# Patient Record
Sex: Female | Born: 1970 | Race: White | Hispanic: No | Marital: Married | Smoking: Current every day smoker
Health system: Southern US, Community
[De-identification: ages and names within clinical notes are randomized; demographics above are authoritative.]

## PROBLEM LIST (undated history)

## (undated) DIAGNOSIS — R070 Pain in throat: Secondary | ICD-10-CM

## (undated) DIAGNOSIS — F329 Major depressive disorder, single episode, unspecified: Secondary | ICD-10-CM

## (undated) DIAGNOSIS — M1990 Unspecified osteoarthritis, unspecified site: Secondary | ICD-10-CM

## (undated) DIAGNOSIS — D680 Von Willebrand's disease: Secondary | ICD-10-CM

## (undated) DIAGNOSIS — K219 Gastro-esophageal reflux disease without esophagitis: Secondary | ICD-10-CM

## (undated) DIAGNOSIS — G43909 Migraine, unspecified, not intractable, without status migrainosus: Secondary | ICD-10-CM

## (undated) DIAGNOSIS — D509 Iron deficiency anemia, unspecified: Secondary | ICD-10-CM

## (undated) DIAGNOSIS — K120 Recurrent oral aphthae: Secondary | ICD-10-CM

## (undated) DIAGNOSIS — F419 Anxiety disorder, unspecified: Secondary | ICD-10-CM

## (undated) DIAGNOSIS — F32A Depression, unspecified: Secondary | ICD-10-CM

## (undated) HISTORY — DX: Unspecified osteoarthritis, unspecified site: M19.90

## (undated) HISTORY — PX: OTHER SURGICAL HISTORY: SHX169

## (undated) HISTORY — DX: Pain in throat: R07.0

## (undated) HISTORY — DX: Major depressive disorder, single episode, unspecified: F32.9

## (undated) HISTORY — DX: Depression, unspecified: F32.A

## (undated) HISTORY — PX: COLONOSCOPY: SHX174

## (undated) HISTORY — PX: ESOPHAGOGASTRODUODENOSCOPY: SHX1529

## (undated) HISTORY — DX: Anxiety disorder, unspecified: F41.9

## (undated) HISTORY — DX: Iron deficiency anemia, unspecified: D50.9

## (undated) HISTORY — DX: Migraine, unspecified, not intractable, without status migrainosus: G43.909

## (undated) HISTORY — DX: Gastro-esophageal reflux disease without esophagitis: K21.9

## (undated) HISTORY — DX: Recurrent oral aphthae: K12.0

---
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
## 1997-10-31 ENCOUNTER — Other Ambulatory Visit: Admission: RE | Admit: 1997-10-31 | Discharge: 1997-10-31 | Payer: Self-pay | Admitting: Obstetrics and Gynecology

## 1997-11-20 ENCOUNTER — Other Ambulatory Visit: Admission: RE | Admit: 1997-11-20 | Discharge: 1997-11-20 | Payer: Self-pay | Admitting: Obstetrics and Gynecology

## 1998-03-12 ENCOUNTER — Other Ambulatory Visit: Admission: RE | Admit: 1998-03-12 | Discharge: 1998-03-12 | Payer: Self-pay | Admitting: Obstetrics and Gynecology

## 1998-03-13 ENCOUNTER — Ambulatory Visit (HOSPITAL_COMMUNITY): Admission: RE | Admit: 1998-03-13 | Discharge: 1998-03-13 | Payer: Self-pay | Admitting: Obstetrics and Gynecology

## 1998-07-14 ENCOUNTER — Emergency Department (HOSPITAL_COMMUNITY): Admission: EM | Admit: 1998-07-14 | Discharge: 1998-07-14 | Payer: Self-pay | Admitting: Emergency Medicine

## 1998-07-14 ENCOUNTER — Encounter: Payer: Self-pay | Admitting: Emergency Medicine

## 1998-08-12 ENCOUNTER — Other Ambulatory Visit: Admission: RE | Admit: 1998-08-12 | Discharge: 1998-08-12 | Payer: Self-pay | Admitting: Obstetrics and Gynecology

## 1999-02-01 ENCOUNTER — Emergency Department (HOSPITAL_COMMUNITY): Admission: EM | Admit: 1999-02-01 | Discharge: 1999-02-01 | Payer: Self-pay | Admitting: Emergency Medicine

## 1999-02-01 ENCOUNTER — Encounter: Payer: Self-pay | Admitting: Emergency Medicine

## 1999-04-17 ENCOUNTER — Other Ambulatory Visit: Admission: RE | Admit: 1999-04-17 | Discharge: 1999-04-17 | Payer: Self-pay | Admitting: Obstetrics and Gynecology

## 2000-06-17 ENCOUNTER — Other Ambulatory Visit: Admission: RE | Admit: 2000-06-17 | Discharge: 2000-06-17 | Payer: Self-pay | Admitting: Internal Medicine

## 2001-09-11 ENCOUNTER — Emergency Department (HOSPITAL_COMMUNITY): Admission: EM | Admit: 2001-09-11 | Discharge: 2001-09-11 | Payer: Self-pay | Admitting: Emergency Medicine

## 2001-09-11 ENCOUNTER — Encounter: Payer: Self-pay | Admitting: Emergency Medicine

## 2002-12-12 ENCOUNTER — Other Ambulatory Visit: Admission: RE | Admit: 2002-12-12 | Discharge: 2002-12-12 | Payer: Self-pay | Admitting: Obstetrics and Gynecology

## 2004-03-11 ENCOUNTER — Other Ambulatory Visit: Admission: RE | Admit: 2004-03-11 | Discharge: 2004-03-11 | Payer: Self-pay | Admitting: Obstetrics and Gynecology

## 2005-01-07 ENCOUNTER — Ambulatory Visit (HOSPITAL_COMMUNITY): Admission: RE | Admit: 2005-01-07 | Discharge: 2005-01-07 | Payer: Self-pay | Admitting: General Practice

## 2005-01-07 ENCOUNTER — Ambulatory Visit: Payer: Self-pay | Admitting: Internal Medicine

## 2005-02-02 ENCOUNTER — Ambulatory Visit: Payer: Self-pay | Admitting: Internal Medicine

## 2005-06-12 ENCOUNTER — Other Ambulatory Visit: Admission: RE | Admit: 2005-06-12 | Discharge: 2005-06-12 | Payer: Self-pay | Admitting: Obstetrics and Gynecology

## 2006-03-26 ENCOUNTER — Ambulatory Visit: Payer: Self-pay | Admitting: Endocrinology

## 2006-07-08 ENCOUNTER — Ambulatory Visit: Payer: Self-pay | Admitting: Internal Medicine

## 2007-02-28 ENCOUNTER — Ambulatory Visit: Payer: Self-pay | Admitting: Family Medicine

## 2007-05-30 ENCOUNTER — Encounter: Payer: Self-pay | Admitting: Internal Medicine

## 2007-05-30 DIAGNOSIS — D509 Iron deficiency anemia, unspecified: Secondary | ICD-10-CM

## 2007-05-30 DIAGNOSIS — F3341 Major depressive disorder, recurrent, in partial remission: Secondary | ICD-10-CM

## 2007-05-30 HISTORY — DX: Iron deficiency anemia, unspecified: D50.9

## 2007-12-05 ENCOUNTER — Ambulatory Visit: Payer: Self-pay | Admitting: Internal Medicine

## 2007-12-05 DIAGNOSIS — M542 Cervicalgia: Secondary | ICD-10-CM

## 2007-12-05 DIAGNOSIS — J019 Acute sinusitis, unspecified: Secondary | ICD-10-CM | POA: Insufficient documentation

## 2008-06-15 ENCOUNTER — Ambulatory Visit: Payer: Self-pay | Admitting: Internal Medicine

## 2008-06-15 DIAGNOSIS — F430 Acute stress reaction: Secondary | ICD-10-CM

## 2008-06-15 DIAGNOSIS — F411 Generalized anxiety disorder: Secondary | ICD-10-CM

## 2008-06-15 DIAGNOSIS — F419 Anxiety disorder, unspecified: Secondary | ICD-10-CM | POA: Insufficient documentation

## 2008-06-15 DIAGNOSIS — J069 Acute upper respiratory infection, unspecified: Secondary | ICD-10-CM

## 2008-06-21 ENCOUNTER — Ambulatory Visit: Payer: Self-pay | Admitting: Professional

## 2008-07-26 ENCOUNTER — Ambulatory Visit: Payer: Self-pay | Admitting: Internal Medicine

## 2008-07-26 DIAGNOSIS — R070 Pain in throat: Secondary | ICD-10-CM

## 2008-07-26 DIAGNOSIS — M545 Low back pain: Secondary | ICD-10-CM

## 2008-07-26 HISTORY — DX: Pain in throat: R07.0

## 2009-01-29 ENCOUNTER — Ambulatory Visit: Payer: Self-pay | Admitting: Family Medicine

## 2009-02-28 ENCOUNTER — Emergency Department (HOSPITAL_COMMUNITY): Admission: EM | Admit: 2009-02-28 | Discharge: 2009-02-28 | Payer: Self-pay | Admitting: Emergency Medicine

## 2009-04-02 ENCOUNTER — Encounter: Payer: Self-pay | Admitting: Internal Medicine

## 2009-05-15 ENCOUNTER — Ambulatory Visit: Payer: Self-pay | Admitting: Family Medicine

## 2009-06-28 ENCOUNTER — Ambulatory Visit: Payer: Self-pay | Admitting: Family Medicine

## 2009-08-29 ENCOUNTER — Ambulatory Visit: Payer: Self-pay | Admitting: Family Medicine

## 2009-09-11 ENCOUNTER — Encounter: Payer: Self-pay | Admitting: Internal Medicine

## 2009-10-08 ENCOUNTER — Emergency Department (HOSPITAL_COMMUNITY): Admission: EM | Admit: 2009-10-08 | Discharge: 2009-10-08 | Payer: Self-pay | Admitting: Emergency Medicine

## 2011-12-11 ENCOUNTER — Ambulatory Visit (INDEPENDENT_AMBULATORY_CARE_PROVIDER_SITE_OTHER): Payer: 59 | Admitting: Internal Medicine

## 2011-12-11 VITALS — BP 92/60 | HR 77 | Temp 99.4°F | Ht 61.0 in | Wt 132.0 lb

## 2011-12-11 DIAGNOSIS — M791 Myalgia, unspecified site: Secondary | ICD-10-CM

## 2011-12-11 DIAGNOSIS — IMO0001 Reserved for inherently not codable concepts without codable children: Secondary | ICD-10-CM

## 2011-12-11 DIAGNOSIS — R070 Pain in throat: Secondary | ICD-10-CM

## 2011-12-11 DIAGNOSIS — H1032 Unspecified acute conjunctivitis, left eye: Secondary | ICD-10-CM

## 2011-12-11 DIAGNOSIS — H1030 Unspecified acute conjunctivitis, unspecified eye: Secondary | ICD-10-CM

## 2011-12-11 DIAGNOSIS — J019 Acute sinusitis, unspecified: Secondary | ICD-10-CM

## 2011-12-11 MED ORDER — TOBRAMYCIN 0.3 % OP SOLN: 1.0000 [drp] | Freq: Four times a day (QID) | OPHTHALMIC | Status: AC

## 2011-12-11 MED ORDER — CEFUROXIME AXETIL 250 MG PO TABS: 250.0000 mg | ORAL_TABLET | Freq: Two times a day (BID) | ORAL | Status: AC

## 2011-12-11 MED ORDER — IBUPROFEN 600 MG PO TABS: 600.0000 mg | ORAL_TABLET | Freq: Three times a day (TID) | ORAL | Status: AC | PRN

## 2011-12-11 NOTE — Patient Instructions (Signed)
Take all new medications as prescribed Continue all other medications as before You will be contacted regarding the referral for: Dr Lazarus Salines

## 2011-12-12 ENCOUNTER — Encounter: Payer: Self-pay | Admitting: Internal Medicine

## 2011-12-12 DIAGNOSIS — F329 Major depressive disorder, single episode, unspecified: Secondary | ICD-10-CM | POA: Insufficient documentation

## 2011-12-12 DIAGNOSIS — F419 Anxiety disorder, unspecified: Secondary | ICD-10-CM | POA: Insufficient documentation

## 2011-12-12 DIAGNOSIS — A6000 Herpesviral infection of urogenital system, unspecified: Secondary | ICD-10-CM | POA: Insufficient documentation

## 2011-12-12 DIAGNOSIS — K219 Gastro-esophageal reflux disease without esophagitis: Secondary | ICD-10-CM | POA: Insufficient documentation

## 2011-12-12 DIAGNOSIS — K120 Recurrent oral aphthae: Secondary | ICD-10-CM | POA: Insufficient documentation

## 2011-12-12 NOTE — Assessment & Plan Note (Signed)
Mild to mod, for antibx course,  to f/u any worsening symptoms or concerns 

## 2011-12-12 NOTE — Progress Notes (Signed)
  Subjective:    Patient ID: Carmen Hoffman, female    DOB: 1971-02-11, 41 y.o.   MRN: [id number]  HPI  Here with 3 days acute onset fever, facial pain, pressure, general weakness and malaise, and greenish d/c, with slight ST, but little to no cough and Pt denies chest pain, increased sob or doe, wheezing, orthopnea, PND, increased LE swelling, palpitations, dizziness or syncope, also with left eye itching and matting as well with slight d/c.  Has diffuse aches adn advil alone not working well.  Also has seen Dr Shelba Flake in the past, also with concern about a feeling of a ? Mass just in back of the tongue, but no dysphagia, n/v, cough .  Pt denies new neurological symptoms such as new headache, or facial or extremity weakness or numbness   Pt denies polydipsia, polyuria. Past Medical History  Diagnosis Date  . GERD (gastroesophageal reflux disease)   . Genital herpes   . Aphthous stomatitis   . Anxiety   . Depression    History reviewed. No pertinent past surgical history.  reports that she has been smoking.  She does not have any smokeless tobacco history on file. She reports that she does not drink alcohol or use illicit drugs. family history includes Fibromyalgia in her mother and Hypertension in her father. No Known Allergies No current outpatient prescriptions on file prior to visit.   Review of Systems Review of Systems  Constitutional: Negative for diaphoresis and unexpected weight change.  HENT: Negative for drooling and tinnitus.   Eyes: Negative for photophobia and visual disturbance.  Respiratory: Negative for choking and stridor.   Gastrointestinal: Negative for vomiting and blood in stool.  Genitourinary: Negative for hematuria and decreased urine volume.    Objective:   Physical Exam BP 92/60  Pulse 77  Temp(Src) 99.4 F (37.4 C) (Oral)  Ht 5\' 1"  (1.549 m)  Wt 132 lb (59.875 kg)  BMI 24.94 kg/m2  SpO2 97% Physical Exam  VS noted, mild ill Constitutional: Pt  appears well-developed and well-nourished.  HENT: Head: Normocephalic.  Right Ear: External ear normal.  Left Ear: External ear normal.  left conjucnt with 1+puffy, erythema, watery d/c Mouth; no sweling, erythema, mass, tongue swelling or ulcers Bilat tm's mild erythema.  Sinus tender.  Pharynx mild erythema Eyes: Conjunctivae and EOM are normal. Pupils are equal, round, and reactive to light.  Neck: Normal range of motion. Neck supple.  Cardiovascular: Normal rate and regular rhythm.   Pulmonary/Chest: Effort normal and breath sounds normal.  Neurological: Pt is alert. No cranial nerve deficit.  Skin: Skin is warm. No erythema.  Psychiatric: Pt behavior is normal. Thought content normal. tense, nervous 1+    Assessment & Plan:

## 2011-12-12 NOTE — Assessment & Plan Note (Signed)
?   Recurrent stomatitis vs other - for ENT referral for re-eval

## 2011-12-12 NOTE — Assessment & Plan Note (Signed)
,  Mild to mod, for antibx course,  to f/u any worsening symptoms or concerns 

## 2011-12-12 NOTE — Assessment & Plan Note (Signed)
Mild to mod, for nsaid prn,  to f/u any worsening symptoms or concerns 

## 2012-01-13 ENCOUNTER — Other Ambulatory Visit: Payer: Self-pay | Admitting: Otolaryngology

## 2012-01-13 DIAGNOSIS — K219 Gastro-esophageal reflux disease without esophagitis: Secondary | ICD-10-CM

## 2012-01-15 ENCOUNTER — Ambulatory Visit
Admission: RE | Admit: 2012-01-15 | Discharge: 2012-01-15 | Disposition: A | Discharge status: Home / Self Care | Payer: 59 | Source: Ambulatory Visit | Attending: Otolaryngology | Admitting: Otolaryngology

## 2012-01-15 DIAGNOSIS — K219 Gastro-esophageal reflux disease without esophagitis: Secondary | ICD-10-CM

## 2012-07-05 ENCOUNTER — Telehealth: Payer: Self-pay | Admitting: Internal Medicine

## 2012-07-05 NOTE — Telephone Encounter (Signed)
Ok w/me if she would like to re-establish Thx

## 2012-07-05 NOTE — Telephone Encounter (Signed)
Pt saw Dr Jonny Ruiz for an acute in March 2013.  LOV with Dr. Posey Rea was in 2009.  Do you want [place] to re-est her with you?  She has EMR insurance.  She want to be worked in for back pain.

## 2012-07-12 NOTE — Telephone Encounter (Signed)
APPT ON OCT 7.

## 2012-07-18 ENCOUNTER — Ambulatory Visit (INDEPENDENT_AMBULATORY_CARE_PROVIDER_SITE_OTHER): Payer: 59 | Admitting: Internal Medicine

## 2012-07-18 ENCOUNTER — Other Ambulatory Visit (INDEPENDENT_AMBULATORY_CARE_PROVIDER_SITE_OTHER): Payer: 59

## 2012-07-18 ENCOUNTER — Encounter: Payer: Self-pay | Admitting: Internal Medicine

## 2012-07-18 ENCOUNTER — Telehealth: Payer: Self-pay | Admitting: Internal Medicine

## 2012-07-18 VITALS — BP 100/50 | HR 68 | Temp 98.4°F | Resp 16 | Ht 61.0 in | Wt 133.8 lb

## 2012-07-18 DIAGNOSIS — R3915 Urgency of urination: Secondary | ICD-10-CM | POA: Insufficient documentation

## 2012-07-18 DIAGNOSIS — R209 Unspecified disturbances of skin sensation: Secondary | ICD-10-CM

## 2012-07-18 DIAGNOSIS — M545 Low back pain: Secondary | ICD-10-CM

## 2012-07-18 DIAGNOSIS — K219 Gastro-esophageal reflux disease without esophagitis: Secondary | ICD-10-CM

## 2012-07-18 DIAGNOSIS — R202 Paresthesia of skin: Secondary | ICD-10-CM

## 2012-07-18 DIAGNOSIS — Z23 Encounter for immunization: Secondary | ICD-10-CM

## 2012-07-18 LAB — CBC WITH DIFFERENTIAL/PLATELET
Basophils Absolute: 0 10*3/uL (ref 0.0–0.1)
Eosinophils Absolute: 0.1 10*3/uL (ref 0.0–0.7)
HCT: 38.8 % (ref 36.0–46.0)
Lymphs Abs: 2.2 10*3/uL (ref 0.7–4.0)
MCHC: 33.1 g/dL (ref 30.0–36.0)
MCV: 90.6 fl (ref 78.0–100.0)
Monocytes Absolute: 0.5 10*3/uL (ref 0.1–1.0)
Platelets: 165 10*3/uL (ref 150.0–400.0)
RDW: 12.8 % (ref 11.5–14.6)

## 2012-07-18 LAB — BASIC METABOLIC PANEL
Calcium: 9.5 mg/dL (ref 8.4–10.5)
GFR: 115.15 mL/min (ref >60.00)
Glucose, Bld: 90 mg/dL (ref 70–99)
Sodium: 137 mEq/L (ref 135–145)

## 2012-07-18 LAB — URINALYSIS
Ketones, ur: NEGATIVE
Specific Gravity, Urine: 1.02 (ref 1.000–1.030)
Total Protein, Urine: NEGATIVE
Urine Glucose: NEGATIVE

## 2012-07-18 LAB — HEPATIC FUNCTION PANEL
ALT: 18 U/L (ref 0–35)
Bilirubin, Direct: 0.1 mg/dL (ref 0.0–0.3)
Total Bilirubin: 0.2 mg/dL — ABNORMAL LOW (ref 0.3–1.2)

## 2012-07-18 LAB — LIPID PANEL
Cholesterol: 163 mg/dL (ref 0–200)
HDL: 41.4 mg/dL (ref >39.00)
LDL Cholesterol: 99 mg/dL (ref 0–99)
Total CHOL/HDL Ratio: 4
Triglycerides: 112 mg/dL (ref 0.0–149.0)

## 2012-07-18 LAB — TSH: TSH: 3.14 u[IU]/mL (ref 0.35–5.50)

## 2012-07-18 MED ORDER — IBUPROFEN 600 MG PO TABS: 600.0000 mg | ORAL_TABLET | Freq: Three times a day (TID) | ORAL | Status: DC | PRN

## 2012-07-18 MED ORDER — MIRABEGRON ER 25 MG PO TB24: 25.0000 mg | ORAL_TABLET | Freq: Every day | ORAL | Status: DC

## 2012-07-18 MED ORDER — CYANOCOBALAMIN 1000 MCG/ML IJ SOLN: INTRAMUSCULAR | Status: DC

## 2012-07-18 MED ORDER — VITAMIN D 1000 UNITS PO TABS: 1000.0000 [IU] | ORAL_TABLET | Freq: Every day | ORAL | Status: DC

## 2012-07-18 NOTE — Telephone Encounter (Signed)
Carmen Hoffman, please, inform patient that all labs are normal except for low vit B12. Come for the first shot here or start sq shots at home -- 1000 mcg sq qd x 7 d, then q 1 wk x 4 wks, then monthly. Star vit D  Keep rov. B12 level prior  Thx

## 2012-07-18 NOTE — Progress Notes (Signed)
  Subjective:    Patient ID: Carmen Hoffman, female    DOB: Oct 01, 1971, 41 y.o.   MRN: [id number]  HPI  C/o LBP and LLE pain x years C/o frequent urination, urgency C/o fatigue C/o GERD  Review of Systems  Constitutional: Positive for fatigue. Negative for chills, activity change, appetite change and unexpected weight change.  HENT: Negative for congestion, mouth sores and sinus pressure.   Eyes: Negative for visual disturbance.  Respiratory: Negative for cough and chest tightness.   Gastrointestinal: Negative for nausea and abdominal pain.  Genitourinary: Positive for frequency. Negative for difficulty urinating and vaginal pain.  Musculoskeletal: Negative for back pain and gait problem.  Skin: Negative for pallor and rash.  Neurological: Negative for dizziness, tremors, weakness, numbness and headaches.  Psychiatric/Behavioral: Negative for confusion and disturbed wake/sleep cycle.       Objective:   Physical Exam  Constitutional: She appears well-developed. No distress.  HENT:  Head: Normocephalic.  Right Ear: External ear normal.  Left Ear: External ear normal.  Nose: Nose normal.  Mouth/Throat: Oropharynx is clear and moist.  Eyes: Conjunctivae normal are normal. Pupils are equal, round, and reactive to light. Right eye exhibits no discharge. Left eye exhibits no discharge.  Neck: Normal range of motion. Neck supple. No JVD present. No tracheal deviation present. No thyromegaly present.  Cardiovascular: Normal rate, regular rhythm and normal heart sounds.   Pulmonary/Chest: No stridor. No respiratory distress. She has no wheezes.  Abdominal: Soft. Bowel sounds are normal. She exhibits no distension and no mass. There is no tenderness. There is no rebound and no guarding.  Musculoskeletal: She exhibits no edema and no tenderness.  Lymphadenopathy:    She has no cervical adenopathy.  Neurological: She displays normal reflexes. No cranial nerve deficit. She exhibits normal  muscle tone. Coordination normal.  Skin: No rash noted. No erythema.  Psychiatric: She has a normal mood and affect. Her behavior is normal. Judgment and thought content normal.          Assessment & Plan:

## 2012-07-18 NOTE — Assessment & Plan Note (Signed)
Chronic MSK 

## 2012-07-19 ENCOUNTER — Telehealth: Payer: Self-pay | Admitting: Internal Medicine

## 2012-07-19 MED ORDER — ERGOCALCIFEROL 1.25 MG (50000 UT) PO CAPS: 50000.0000 [IU] | ORAL_CAPSULE | ORAL | Status: DC

## 2012-07-19 NOTE — Telephone Encounter (Signed)
Carmen Hoffman, please, inform patient that her vit D is low too Start Rx vit D ROV Thx

## 2012-07-19 NOTE — Telephone Encounter (Signed)
Left detailed mess informing pt of below.  

## 2012-07-21 ENCOUNTER — Ambulatory Visit (INDEPENDENT_AMBULATORY_CARE_PROVIDER_SITE_OTHER): Payer: 59

## 2012-07-21 DIAGNOSIS — D509 Iron deficiency anemia, unspecified: Secondary | ICD-10-CM

## 2012-07-21 MED ORDER — CYANOCOBALAMIN 1000 MCG/ML IJ SOLN
1000.0000 ug | Freq: Once | INTRAMUSCULAR | Status: AC
  Administered 2012-07-21: 1000 ug via INTRAMUSCULAR

## 2012-07-22 ENCOUNTER — Telehealth: Payer: Self-pay

## 2012-07-22 ENCOUNTER — Ambulatory Visit (INDEPENDENT_AMBULATORY_CARE_PROVIDER_SITE_OTHER): Payer: 59

## 2012-07-22 DIAGNOSIS — D509 Iron deficiency anemia, unspecified: Secondary | ICD-10-CM

## 2012-07-22 MED ORDER — SYRINGE (DISPOSABLE) 3 ML MISC: 1.0000 | Status: DC

## 2012-07-22 MED ORDER — "NEEDLE (DISP) 25G X 1"" MISC": 1.0000 | Status: DC

## 2012-07-22 MED ORDER — CYANOCOBALAMIN 1000 MCG/ML IJ SOLN
1000.0000 ug | Freq: Once | INTRAMUSCULAR | Status: AC
  Administered 2012-07-22: 1000 ug via INTRAMUSCULAR

## 2012-07-22 NOTE — Telephone Encounter (Signed)
Pt is requesting Rx for needles and syringes to have spouse inject B-12 instead of office administration. Okay to Rx?

## 2012-07-22 NOTE — Telephone Encounter (Signed)
Ok Thx 

## 2012-07-22 NOTE — Telephone Encounter (Signed)
Pt advised via personal VM 

## 2012-08-07 ENCOUNTER — Encounter (HOSPITAL_COMMUNITY): Payer: Self-pay

## 2012-08-07 ENCOUNTER — Emergency Department (HOSPITAL_COMMUNITY)
Admission: EM | Admit: 2012-08-07 | Discharge: 2012-08-07 | Disposition: A | Discharge status: Home / Self Care | Payer: 59 | Source: Home / Self Care | Attending: Emergency Medicine | Admitting: Emergency Medicine

## 2012-08-07 DIAGNOSIS — J329 Chronic sinusitis, unspecified: Secondary | ICD-10-CM

## 2012-08-07 MED ORDER — FEXOFENADINE-PSEUDOEPHED ER 60-120 MG PO TB12: 1.0000 | ORAL_TABLET | Freq: Two times a day (BID) | ORAL | Status: DC

## 2012-08-07 MED ORDER — AMOXICILLIN 500 MG PO CAPS: 500.0000 mg | ORAL_CAPSULE | Freq: Three times a day (TID) | ORAL | Status: AC

## 2012-08-07 NOTE — ED Notes (Signed)
C/o sinus pain and pressure, headache and drainage x 3 days

## 2012-08-07 NOTE — ED Provider Notes (Signed)
History     CSN: [id number]  Arrival date & time 08/07/12  0901   First MD Initiated Contact with Patient 08/07/12 [phone]      Chief Complaint  Patient presents with  . Facial Pain    (Consider location/radiation/quality/duration/timing/severity/associated sxs/prior treatment) HPI Comments: The patient presents this morning to urgent care complaining of ongoing and increasing sinus pain and congestion (points to both maxillary regions), with a runny and congested nose and postnasal drainage. Describe symptoms for about 3-4 days. She had taken some decongestions, denies any nausea vomiting. Feeling not her usual self tired fatigue and in significant discomfort secondary to a headache.  Patient is a 41 y.o. female presenting with headaches. The history is provided by the patient.  Headache The primary symptoms include headaches. Primary symptoms do not include syncope, loss of consciousness, altered mental status, dizziness, loss of sensation, fever, nausea or vomiting. The symptoms began 3 to 5 days ago. The symptoms are worsening. The neurological symptoms are multifocal.  The headache is not associated with neck stiffness or weakness.  Additional symptoms include pain. Additional symptoms do not include neck stiffness, weakness or tinnitus.    Past Medical History  Diagnosis Date  . GERD (gastroesophageal reflux disease)   . Genital herpes   . Aphthous stomatitis   . Anxiety   . Depression   . ANEMIA-IRON DEFICIENCY 05/30/2007    History reviewed. No pertinent past surgical history.  Family History  Problem Relation Age of Onset  . Fibromyalgia Mother   . Hypertension Father     History  Substance Use Topics  . Smoking status: Current Some Day Smoker  . Smokeless tobacco: Not on file  . Alcohol Use: No    OB History    Grav Para Term Preterm Abortions TAB SAB Ect Mult Living                  Review of Systems  Constitutional: Positive for activity change and  appetite change. Negative for fever and chills.  HENT: Negative for neck stiffness and tinnitus.   Respiratory: Negative for cough and shortness of breath.   Cardiovascular: Negative for syncope.  Gastrointestinal: Negative for nausea and vomiting.  Skin: Negative for rash and wound.  Neurological: Positive for headaches. Negative for dizziness, loss of consciousness and weakness.  Psychiatric/Behavioral: Negative for altered mental status.    Allergies  Review of patient's allergies indicates no known allergies.  Home Medications   Current Outpatient Rx  Name Route Sig Dispense Refill  . CYANOCOBALAMIN 1000 MCG/ML IJ SOLN  1000 mcg sq qd x 7 d, then 1000 mcg q 1 wk x 4 wks, then 1000 mcg monthly. 10 mL 6  . ERGOCALCIFEROL 50000 UNITS PO CAPS Oral Take 1 capsule (50,000 Units total) by mouth once a week. 6 capsule 0  . IBUPROFEN 600 MG PO TABS Oral Take 1 tablet (600 mg total) by mouth every 8 (eight) hours as needed for pain. 60 tablet 3  . NEEDLE (DISP) 25G X 1" MISC Does not apply 1 each by Does not apply route See admin instructions. 10 each 2  . SYRINGE (DISPOSABLE) 3 ML MISC Does not apply 1 each by Does not apply route See admin instructions. 25 each 1  . AMOXICILLIN 500 MG PO CAPS Oral Take 1 capsule (500 mg total) by mouth 3 (three) times daily. 30 capsule 0  . VITAMIN D 1000 UNITS PO TABS Oral Take 1 tablet (1,000 Units total) by mouth daily. 100  tablet 3  . FEXOFENADINE-PSEUDOEPHED ER 60-120 MG PO TB12 Oral Take 1 tablet by mouth 2 (two) times daily. 30 tablet 0  . MIRABEGRON ER 25 MG PO TB24 Oral Take 1 tablet (25 mg total) by mouth daily. 30 tablet 11    BP 102/69  Pulse 85  Temp 99 F (37.2 C) (Oral)  Resp 18  SpO2 96%  LMP 07/18/2012  Physical Exam  Nursing note and vitals reviewed. Constitutional: Vital signs are normal. She appears well-developed and well-nourished.  Non-toxic appearance. She does not have a sickly appearance. She does not appear ill. No  distress.  HENT:  Head: Atraumatic.  Right Ear: Tympanic membrane normal.  Left Ear: Tympanic membrane normal.  Mouth/Throat: Uvula is midline and mucous membranes are normal. Posterior oropharyngeal erythema present. No oropharyngeal exudate, posterior oropharyngeal edema or tonsillar abscesses.  Eyes: Conjunctivae normal are normal. Right eye exhibits no discharge. Left eye exhibits no discharge.  Neck: Neck supple. No JVD present.  Cardiovascular: Normal rate.  Exam reveals no gallop.   No murmur heard. Pulmonary/Chest: Effort normal and breath sounds normal.  Musculoskeletal: She exhibits no tenderness.  Lymphadenopathy:    She has no cervical adenopathy.  Skin: No rash noted. No erythema.    ED Course  Procedures (including critical care time)  Labs Reviewed - No data to display No results found.   1. Sinusitis       MDM  Maxillary sinusitis, patient was prescribed Allegra-D and encouraged to wait a few days before committing herself to an antibiotic. I have discussed with patient that most likely this is a viral process or secondary to environmental changes or allergies. Patient is afebrile. She acknowledges understanding of conservative management prior to taking antibiotics. She agrees with treatment plan and follow-up care as necessary        Jimmie Molly, MD 08/07/12 1256

## 2012-08-22 ENCOUNTER — Other Ambulatory Visit: Payer: Self-pay | Admitting: *Deleted

## 2012-08-22 MED ORDER — VITAMIN D 1000 UNITS PO TABS: 1000.0000 [IU] | ORAL_TABLET | Freq: Every day | ORAL | Status: AC

## 2012-09-15 ENCOUNTER — Telehealth: Payer: Self-pay

## 2012-09-15 NOTE — Telephone Encounter (Signed)
Pt called requesting documentation that Influenza vaccination was received. Immunization report printed and placed in cabinet for pt pick up. Pt aware.

## 2012-10-17 ENCOUNTER — Encounter: Payer: Self-pay | Admitting: Internal Medicine

## 2012-10-17 ENCOUNTER — Ambulatory Visit (INDEPENDENT_AMBULATORY_CARE_PROVIDER_SITE_OTHER): Payer: 59 | Admitting: Internal Medicine

## 2012-10-17 ENCOUNTER — Other Ambulatory Visit (INDEPENDENT_AMBULATORY_CARE_PROVIDER_SITE_OTHER): Payer: 59

## 2012-10-17 ENCOUNTER — Other Ambulatory Visit: Payer: Self-pay | Admitting: *Deleted

## 2012-10-17 VITALS — BP 102/60 | HR 76 | Temp 98.6°F | Resp 16 | Wt 134.0 lb

## 2012-10-17 DIAGNOSIS — Z0000 Encounter for general adult medical examination without abnormal findings: Secondary | ICD-10-CM

## 2012-10-17 DIAGNOSIS — D509 Iron deficiency anemia, unspecified: Secondary | ICD-10-CM

## 2012-10-17 DIAGNOSIS — R7309 Other abnormal glucose: Secondary | ICD-10-CM

## 2012-10-17 DIAGNOSIS — E538 Deficiency of other specified B group vitamins: Secondary | ICD-10-CM

## 2012-10-17 DIAGNOSIS — L0233 Carbuncle of buttock: Secondary | ICD-10-CM

## 2012-10-17 DIAGNOSIS — L0232 Furuncle of buttock: Secondary | ICD-10-CM | POA: Insufficient documentation

## 2012-10-17 DIAGNOSIS — E559 Vitamin D deficiency, unspecified: Secondary | ICD-10-CM | POA: Insufficient documentation

## 2012-10-17 DIAGNOSIS — R739 Hyperglycemia, unspecified: Secondary | ICD-10-CM

## 2012-10-17 LAB — VITAMIN B12: Vitamin B-12: 403 pg/mL (ref 211–911)

## 2012-10-17 LAB — HEPATIC FUNCTION PANEL
ALT: 11 U/L (ref 0–35)
AST: 16 U/L (ref 0–37)
Albumin: 4 g/dL (ref 3.5–5.2)

## 2012-10-17 MED ORDER — FLUCONAZOLE 150 MG PO TABS: 150.0000 mg | ORAL_TABLET | Freq: Once | ORAL | Status: DC

## 2012-10-17 MED ORDER — DOXYCYCLINE HYCLATE 100 MG PO TABS: 100.0000 mg | ORAL_TABLET | Freq: Two times a day (BID) | ORAL | Status: DC

## 2012-10-17 NOTE — Progress Notes (Signed)
   Subjective:    HPI  F/u on B12 and Vit D def  F/u on LBP and LLE pain x years F/u frequent urination, urgency - resolved F/u fatigue - better F/u  GERD - better  Review of Systems  Constitutional: Positive for fatigue. Negative for chills, activity change, appetite change and unexpected weight change.  HENT: Negative for congestion, mouth sores and sinus pressure.   Eyes: Negative for visual disturbance.  Respiratory: Negative for cough and chest tightness.   Gastrointestinal: Negative for nausea and abdominal pain.  Genitourinary: Positive for frequency. Negative for difficulty urinating and vaginal pain.  Musculoskeletal: Negative for back pain and gait problem.  Skin: Negative for pallor and rash.  Neurological: Negative for dizziness, tremors, weakness, numbness and headaches.  Psychiatric/Behavioral: Negative for confusion and sleep disturbance.   BP Readings from Last 3 Encounters:  10/17/12 102/60  08/07/12 102/69  07/18/12 100/50   Wt Readings from Last 3 Encounters:  10/17/12 134 lb (60.782 kg)  07/18/12 133 lb 12 oz (60.669 kg)  12/11/11 132 lb (59.875 kg)        Objective:   Physical Exam  Constitutional: She appears well-developed. No distress.  HENT:  Head: Normocephalic.  Right Ear: External ear normal.  Left Ear: External ear normal.  Nose: Nose normal.  Mouth/Throat: Oropharynx is clear and moist.  Eyes: Conjunctivae normal are normal. Pupils are equal, round, and reactive to light. Right eye exhibits no discharge. Left eye exhibits no discharge.  Neck: Normal range of motion. Neck supple. No JVD present. No tracheal deviation present. No thyromegaly present.  Cardiovascular: Normal rate, regular rhythm and normal heart sounds.   Pulmonary/Chest: No stridor. No respiratory distress. She has no wheezes.  Abdominal: Soft. Bowel sounds are normal. She exhibits no distension and no mass. There is no tenderness. There is no rebound and no guarding.    Musculoskeletal: She exhibits no edema and no tenderness.  Lymphadenopathy:    She has no cervical adenopathy.  Neurological: She displays normal reflexes. No cranial nerve deficit. She exhibits normal muscle tone. Coordination normal.  Skin: No rash noted. No erythema.  Psychiatric: She has a normal mood and affect. Her behavior is normal. Judgment and thought content normal.   Lab Results  Component Value Date   WBC 7.6 07/18/2012   HGB 12.9 07/18/2012   HCT 38.8 07/18/2012   PLT 165.0 07/18/2012   GLUCOSE 90 07/18/2012   CHOL 163 07/18/2012   TRIG 112.0 07/18/2012   HDL 41.40 07/18/2012   LDLCALC 99 07/18/2012   ALT 18 07/18/2012   AST 18 07/18/2012   NA 137 07/18/2012   K 4.0 07/18/2012   CL 104 07/18/2012   CREATININE 0.6 07/18/2012   BUN 13 07/18/2012   CO2 27 07/18/2012   TSH 3.14 07/18/2012          Assessment & Plan:

## 2012-10-17 NOTE — Assessment & Plan Note (Signed)
1/14 L  Doxy x 10 d

## 2012-10-18 ENCOUNTER — Telehealth: Payer: Self-pay | Admitting: Internal Medicine

## 2012-10-18 DIAGNOSIS — E538 Deficiency of other specified B group vitamins: Secondary | ICD-10-CM

## 2012-10-18 DIAGNOSIS — E559 Vitamin D deficiency, unspecified: Secondary | ICD-10-CM

## 2012-10-18 LAB — VITAMIN D 25 HYDROXY (VIT D DEFICIENCY, FRACTURES): Vit D, 25-Hydroxy: 23 ng/mL — ABNORMAL LOW (ref 30–89)

## 2012-10-18 MED ORDER — ERGOCALCIFEROL 1.25 MG (50000 UT) PO CAPS: 50000.0000 [IU] | ORAL_CAPSULE | ORAL | Status: DC

## 2012-10-18 NOTE — Telephone Encounter (Signed)
Left mess for patient to call back.  

## 2012-10-18 NOTE — Telephone Encounter (Signed)
Pt informed/ labs entered.

## 2012-10-18 NOTE — Telephone Encounter (Signed)
Carmen Hoffman, please, inform patient that all labs are normal except for low vit D - cont Vit D Rx Cont B12 as before RTC with Vit D and B12 labs as sch  Thx

## 2013-01-01 ENCOUNTER — Encounter (HOSPITAL_COMMUNITY): Payer: Self-pay | Admitting: *Deleted

## 2013-01-01 ENCOUNTER — Emergency Department (HOSPITAL_COMMUNITY)
Admission: EM | Admit: 2013-01-01 | Discharge: 2013-01-01 | Disposition: A | Discharge status: Home / Self Care | Payer: 59 | Source: Home / Self Care | Attending: Family Medicine | Admitting: Family Medicine

## 2013-01-01 DIAGNOSIS — T7840XA Allergy, unspecified, initial encounter: Secondary | ICD-10-CM

## 2013-01-01 DIAGNOSIS — Z888 Allergy status to other drugs, medicaments and biological substances status: Secondary | ICD-10-CM

## 2013-01-01 MED ORDER — HYDROXYZINE HCL 25 MG PO TABS: 25.0000 mg | ORAL_TABLET | Freq: Three times a day (TID) | ORAL | Status: DC | PRN

## 2013-01-01 MED ORDER — RANITIDINE HCL 150 MG PO CAPS: 150.0000 mg | ORAL_CAPSULE | Freq: Two times a day (BID) | ORAL | Status: DC

## 2013-01-01 MED ORDER — METHYLPREDNISOLONE SODIUM SUCC 125 MG IJ SOLR
INTRAMUSCULAR | Status: AC
  Filled 2013-01-01: qty 2

## 2013-01-01 MED ORDER — METHYLPREDNISOLONE SODIUM SUCC 125 MG IJ SOLR
125.0000 mg | Freq: Once | INTRAMUSCULAR | Status: AC
  Administered 2013-01-01: 125 mg via INTRAMUSCULAR

## 2013-01-01 MED ORDER — PREDNISONE 20 MG PO TABS: ORAL_TABLET | ORAL | Status: DC

## 2013-01-01 NOTE — ED Provider Notes (Signed)
History     CSN: [id number]  Arrival date & time 01/01/13  1150   First MD Initiated Contact with Patient 01/01/13 1227      Chief Complaint  Patient presents with  . Allergic Reaction    (Consider location/radiation/quality/duration/timing/severity/associated sxs/prior treatment) HPI Comments: 42 year old female with prior history of allergic reaction to Diflucan several years ago. States she was reexposed Diflucan 2 days ago for treatment of vaginal candidiasis. Few hours after 1 dose of 150 mg oral Diflucan patient started to develop pruriginous rash in patches mostly in her upper body and upper extremities. She has also developed some lower lip swelling. Denies throat discomfort or difficulty breathing or wheezing. No dysphagia. She has taken Benadryl with no significant improvement. No dizziness, visual changes or headache.    Past Medical History  Diagnosis Date  . GERD (gastroesophageal reflux disease)   . Genital herpes   . Aphthous stomatitis   . Anxiety   . Depression   . ANEMIA-IRON DEFICIENCY 05/30/2007    History reviewed. No pertinent past surgical history.  Family History  Problem Relation Age of Onset  . Fibromyalgia Mother   . Hypertension Father     History  Substance Use Topics  . Smoking status: Current Some Day Smoker  . Smokeless tobacco: Not on file  . Alcohol Use: No    OB History   Grav Para Term Preterm Abortions TAB SAB Ect Mult Living                  Review of Systems  Constitutional: Negative for fever, chills and diaphoresis.  HENT: Negative for sore throat, mouth sores, trouble swallowing and voice change.   Eyes: Negative for redness, itching and visual disturbance.  Respiratory: Negative for cough, shortness of breath, wheezing and stridor.   Gastrointestinal: Negative for nausea, vomiting, abdominal pain and diarrhea.  Musculoskeletal: Negative for arthralgias.  Skin: Positive for rash.  Neurological: Negative for dizziness  and headaches.    Allergies  Diflucan  Home Medications   Current Outpatient Rx  Name  Route  Sig  Dispense  Refill  . cholecalciferol (VITAMIN D) 1000 UNITS tablet   Oral   Take 1 tablet (1,000 Units total) by mouth daily.   100 tablet   3   . cyanocobalamin (COBAL-1000) 1000 MCG/ML injection      1000 mcg sq qd x 7 d, then 1000 mcg q 1 wk x 4 wks, then 1000 mcg monthly.   10 mL   6   . ergocalciferol (VITAMIN D2) 50000 UNITS capsule   Oral   Take 1 capsule (50,000 Units total) by mouth once a week.   12 capsule   0   . fexofenadine-pseudoephedrine (ALLEGRA-D) 60-120 MG per tablet   Oral   Take 1 tablet by mouth 2 (two) times daily.   30 tablet   0   . hydrOXYzine (ATARAX/VISTARIL) 25 MG tablet   Oral   Take 1 tablet (25 mg total) by mouth every 8 (eight) hours as needed for itching.   20 tablet   0   . ibuprofen (ADVIL,MOTRIN) 600 MG tablet   Oral   Take 1 tablet (600 mg total) by mouth every 8 (eight) hours as needed for pain.   60 tablet   3   . mirabegron ER (MYRBETRIQ) 25 MG TB24   Oral   Take 1 tablet (25 mg total) by mouth daily.   30 tablet   11   . NEEDLE, DISP, 25  G 25G X 1" MISC   Does not apply   1 each by Does not apply route See admin instructions.   10 each   2   . predniSONE (DELTASONE) 20 MG tablet      2 tabs po daily for 5 days   10 tablet   0   . ranitidine (ZANTAC) 150 MG capsule   Oral   Take 1 capsule (150 mg total) by mouth 2 (two) times daily.   20 capsule   0   . Syringe, Disposable, 3 ML MISC   Does not apply   1 each by Does not apply route See admin instructions.   25 each   1     BP 110/80  Pulse 110  Temp(Src) 98.6 F (37 C) (Oral)  Resp 20  SpO2 100%  LMP 12/10/2012  Physical Exam  Nursing note and vitals reviewed. Constitutional: She is oriented to person, place, and time. She appears well-developed and well-nourished. No distress.  HENT:  Head: Normocephalic and atraumatic.  Mouth/Throat:  Oropharynx is clear and moist. No oropharyngeal exudate.  Mild lower lip swelling. No tongue, pharyngeal or inner oral mucosa erythema or swelling.   Eyes: Conjunctivae are normal. No scleral icterus.  Neck: Neck supple.  Cardiovascular: Normal rate, regular rhythm and normal heart sounds.   Pulmonary/Chest: Effort normal and breath sounds normal.  Abdominal: Soft. Bowel sounds are normal. There is no tenderness.  No HSM  Lymphadenopathy:    She has no cervical adenopathy.  Neurological: She is alert and oriented to person, place, and time.  Skin: Rash noted. She is not diaphoretic.  Fading urticarial rash in dorsum of hands and scattered in upper extremities and upper torso.     ED Course  Procedures (including critical care time)  Labs Reviewed - No data to display No results found.   1. Allergic reaction caused by a drug       MDM  Treated with Solu-Medrol 125 mg IM x1 here prescribe prednisone, hydroxyzine and ranitidine. Otherwise patient is stable in no acute distress. Vital signs normal except for mild tachycardia, airways are intact. No GI symptoms. Recommended to avoid re\re exposure to the offending drug. Supportive care and red flags that should prompt her return to medical attention discussed with patient and provided in writing.        Sharin Grave, MD 01/04/13 1610

## 2013-01-01 NOTE — ED Notes (Signed)
Pt  States   She  Developed     A  Rash  With        Swelling  Of  Lips      X   2  Days            She  Is  Speaking in  Complete  sentances  And  Is  In  No  Severe  Distress          She  Reports  The  Symptoms  Started  after  Taking a  Diflucan  Tablet     She  Took    A  Benadryl       Tablet approx  1  Hr  Ago

## 2013-01-06 ENCOUNTER — Ambulatory Visit (INDEPENDENT_AMBULATORY_CARE_PROVIDER_SITE_OTHER): Payer: 59 | Admitting: Internal Medicine

## 2013-01-06 ENCOUNTER — Encounter: Payer: Self-pay | Admitting: Internal Medicine

## 2013-01-06 VITALS — BP 100/50 | HR 80 | Temp 98.4°F | Resp 16 | Wt 133.0 lb

## 2013-01-06 DIAGNOSIS — E538 Deficiency of other specified B group vitamins: Secondary | ICD-10-CM

## 2013-01-06 DIAGNOSIS — E559 Vitamin D deficiency, unspecified: Secondary | ICD-10-CM

## 2013-01-06 MED ORDER — ESCITALOPRAM OXALATE 10 MG PO TABS: 10.0000 mg | ORAL_TABLET | Freq: Every day | ORAL | Status: DC

## 2013-01-06 MED ORDER — CLONAZEPAM 0.5 MG PO TABS: 0.5000 mg | ORAL_TABLET | Freq: Two times a day (BID) | ORAL | Status: DC | PRN

## 2013-01-06 NOTE — Assessment & Plan Note (Signed)
Continue with current prescription therapy as reflected on the Med list.  

## 2013-01-08 NOTE — Progress Notes (Signed)
   Subjective:    HPI  F/u LBP and LLE pain x years - better F/u fatigue - better F/u GERD - better F/u B12 def  Review of Systems  Constitutional: Positive for fatigue. Negative for chills, activity change, appetite change and unexpected weight change.  HENT: Negative for congestion, mouth sores and sinus pressure.   Eyes: Negative for visual disturbance.  Respiratory: Negative for cough and chest tightness.   Gastrointestinal: Negative for nausea and abdominal pain.  Genitourinary: Positive for frequency. Negative for difficulty urinating and vaginal pain.  Musculoskeletal: Negative for back pain and gait problem.  Skin: Negative for pallor and rash.  Neurological: Negative for dizziness, tremors, weakness, numbness and headaches.  Psychiatric/Behavioral: Negative for confusion and sleep disturbance.       Objective:   Physical Exam  Constitutional: She appears well-developed. No distress.  HENT:  Head: Normocephalic.  Right Ear: External ear normal.  Left Ear: External ear normal.  Nose: Nose normal.  Mouth/Throat: Oropharynx is clear and moist.  Eyes: Conjunctivae are normal. Pupils are equal, round, and reactive to light. Right eye exhibits no discharge. Left eye exhibits no discharge.  Neck: Normal range of motion. Neck supple. No JVD present. No tracheal deviation present. No thyromegaly present.  Cardiovascular: Normal rate, regular rhythm and normal heart sounds.   Pulmonary/Chest: No stridor. No respiratory distress. She has no wheezes.  Abdominal: Soft. Bowel sounds are normal. She exhibits no distension and no mass. There is no tenderness. There is no rebound and no guarding.  Musculoskeletal: She exhibits no edema and no tenderness.  Lymphadenopathy:    She has no cervical adenopathy.  Neurological: She displays normal reflexes. No cranial nerve deficit. She exhibits normal muscle tone. Coordination normal.  Skin: No rash noted. No erythema.  Psychiatric: She  has a normal mood and affect. Her behavior is normal. Judgment and thought content normal.          Assessment & Plan:

## 2013-01-08 NOTE — Assessment & Plan Note (Signed)
Continue with current prescription therapy as reflected on the Med list.  

## 2013-01-09 ENCOUNTER — Telehealth: Payer: Self-pay | Admitting: Internal Medicine

## 2013-01-09 MED ORDER — HYDROXYZINE HCL 25 MG PO TABS: 25.0000 mg | ORAL_TABLET | Freq: Three times a day (TID) | ORAL | Status: DC | PRN

## 2013-01-09 MED ORDER — RANITIDINE HCL 150 MG PO CAPS: 150.0000 mg | ORAL_CAPSULE | Freq: Two times a day (BID) | ORAL | Status: DC

## 2013-01-09 NOTE — Telephone Encounter (Signed)
Ok Thx 

## 2013-01-09 NOTE — Telephone Encounter (Signed)
Pt does not want to take Klonopin and lexapro, would rather continue with Hydroxyz HCL 25mg  1 tab every 8 hrs and Ranitidine 150mg  1 tab BID, please call in [employer] on [employer] [address]. Please call pt once sent

## 2013-01-11 NOTE — Telephone Encounter (Signed)
Left detailed mess informing pt.  

## 2013-03-23 ENCOUNTER — Emergency Department (HOSPITAL_COMMUNITY)
Admission: EM | Admit: 2013-03-23 | Discharge: 2013-03-23 | Disposition: A | Discharge status: Home / Self Care | Payer: 59 | Source: Home / Self Care | Attending: Family Medicine | Admitting: Family Medicine

## 2013-03-23 ENCOUNTER — Encounter (HOSPITAL_COMMUNITY): Payer: Self-pay

## 2013-03-23 DIAGNOSIS — S139XXA Sprain of joints and ligaments of unspecified parts of neck, initial encounter: Secondary | ICD-10-CM

## 2013-03-23 DIAGNOSIS — S161XXA Strain of muscle, fascia and tendon at neck level, initial encounter: Secondary | ICD-10-CM

## 2013-03-23 MED ORDER — KETOROLAC TROMETHAMINE 30 MG/ML IJ SOLN: 30.0000 mg | Freq: Once | INTRAMUSCULAR | Status: DC

## 2013-03-23 MED ORDER — KETOROLAC TROMETHAMINE 30 MG/ML IJ SOLN
INTRAMUSCULAR | Status: AC
  Filled 2013-03-23: qty 1

## 2013-03-23 MED ORDER — HYDROCODONE-ACETAMINOPHEN 5-325 MG PO TABS: 1.0000 | ORAL_TABLET | ORAL | Status: DC | PRN

## 2013-03-23 MED ORDER — CYCLOBENZAPRINE HCL 5 MG PO TABS: ORAL_TABLET | ORAL | Status: DC

## 2013-03-23 NOTE — ED Provider Notes (Signed)
History     CSN: [id number]  Arrival date & time 03/23/13  1001   First MD Initiated Contact with Patient 03/23/13 1020      Chief Complaint  Patient presents with  . Torticollis    (Consider location/radiation/quality/duration/timing/severity/associated sxs/prior treatment) HPI Comments: 42 year old female states that she was attempting to crank a weedeater pulling the crane several times last weekend. In the ensuing hours and particularly the following day she experienced pain in the paracervical musculature and the bilateral trapezii along the ridge and posterior neck. The pain has increased in intensity over the weekend and is having persistent pain. She is taking Motrin for pain. And has tried heat which helped. She denies direct injury or blunt trauma to the neck. Denies distal paresthesias or motor weakness.   Past Medical History  Diagnosis Date  . GERD (gastroesophageal reflux disease)   . Genital herpes   . Aphthous stomatitis   . Anxiety   . Depression   . ANEMIA-IRON DEFICIENCY 05/30/2007    History reviewed. No pertinent past surgical history.  Family History  Problem Relation Age of Onset  . Fibromyalgia Mother   . Hypertension Father     History  Substance Use Topics  . Smoking status: Current Some Day Smoker  . Smokeless tobacco: Not on file  . Alcohol Use: No    OB History   Grav Para Term Preterm Abortions TAB SAB Ect Mult Living                  Review of Systems  Constitutional: Negative for fever, chills and activity change.  HENT: Negative.   Respiratory: Negative.   Cardiovascular: Negative.   Musculoskeletal:       As per HPI  Skin: Negative for color change, pallor and rash.  Neurological: Negative.     Allergies  Diflucan  Home Medications   Current Outpatient Rx  Name  Route  Sig  Dispense  Refill  . cyanocobalamin (COBAL-1000) 1000 MCG/ML injection      1000 mcg sq qd x 7 d, then 1000 mcg q 1 wk x 4 wks, then 1000 mcg  monthly.   10 mL   6   . hydrOXYzine (ATARAX/VISTARIL) 25 MG tablet   Oral   Take 1 tablet (25 mg total) by mouth every 8 (eight) hours as needed for anxiety.   60 tablet   2   . ranitidine (ZANTAC) 150 MG capsule   Oral   Take 1 capsule (150 mg total) by mouth 2 (two) times daily.   60 capsule   11   . cholecalciferol (VITAMIN D) 1000 UNITS tablet   Oral   Take 1 tablet (1,000 Units total) by mouth daily.   100 tablet   3   . cyclobenzaprine (FLEXERIL) 5 MG tablet      One po before going to bed to relax muscles   20 tablet   0   . ergocalciferol (VITAMIN D2) 50000 UNITS capsule   Oral   Take 1 capsule (50,000 Units total) by mouth once a week.   12 capsule   0   . fexofenadine-pseudoephedrine (ALLEGRA-D) 60-120 MG per tablet   Oral   Take 1 tablet by mouth 2 (two) times daily.   30 tablet   0   . HYDROcodone-acetaminophen (NORCO/VICODIN) 5-325 MG per tablet   Oral   Take 1 tablet by mouth every 4 (four) hours as needed for pain. May cause drowsiness   15 tablet  0   . ibuprofen (ADVIL,MOTRIN) 600 MG tablet   Oral   Take 1 tablet (600 mg total) by mouth every 8 (eight) hours as needed for pain.   60 tablet   3   . mirabegron ER (MYRBETRIQ) 25 MG TB24   Oral   Take 1 tablet (25 mg total) by mouth daily.   30 tablet   11   . NEEDLE, DISP, 25 G 25G X 1" MISC   Does not apply   1 each by Does not apply route See admin instructions.   10 each   2   . Syringe, Disposable, 3 ML MISC   Does not apply   1 each by Does not apply route See admin instructions.   25 each   1     BP 95/65  Pulse 65  Temp(Src) 98.2 F (36.8 C) (Oral)  Resp 10  SpO2 98%  Physical Exam  Nursing note and vitals reviewed. Constitutional: She is oriented to person, place, and time. She appears well-developed and well-nourished. No distress.  HENT:  Head: Normocephalic and atraumatic.  Eyes: EOM are normal. Pupils are equal, round, and reactive to light.  Neck: Normal  range of motion. Neck supple. No thyromegaly present.  Cardiovascular: Normal rate.   Pulmonary/Chest: Effort normal. No respiratory distress.  Musculoskeletal:  Limited range of motion with rotation of the head limited to approximately 10 bilaterally. Forward flexion limited to 15. Marked tenderness to palpation of the bilateral and posterior neck and trapezius musculature. No direct spinal tenderness or deformity. Distal neurovascular motor sensory is intact. Anterior neck is without swelling, tenderness, discoloration or deformity.  Lymphadenopathy:    She has no cervical adenopathy.  Neurological: She is alert and oriented to person, place, and time. No cranial nerve deficit.  Skin: Skin is warm and dry.    ED Course  Procedures (including critical care time)  Labs Reviewed - No data to display No results found.   1. Cervical strain, acute, initial encounter       MDM  Toradol 30 mg IM  apply heat to the neck several times during the day  apply a soft cervical collar and where most of the time during the next week. Take the collar off periodically to perform light range of motion stretches. Norco 5 mg Q4H when necessary pain Flexeril 5 mg prior to going to bed to help relax muscles. These medicines may cause drowsiness. Continued the ibuprofen for pain.  Followup with primary care doctor as needed.          Hayden Rasmussen, NP 03/23/13 1101

## 2013-03-23 NOTE — ED Notes (Signed)
Neck pain for past couple of days since had trouble starting weed eater; no relief w motrin

## 2013-03-25 ENCOUNTER — Encounter (HOSPITAL_COMMUNITY): Payer: Self-pay | Admitting: Family Medicine

## 2013-03-25 ENCOUNTER — Emergency Department (HOSPITAL_COMMUNITY)
Admission: EM | Admit: 2013-03-25 | Discharge: 2013-03-25 | Disposition: A | Discharge status: Home / Self Care | Payer: 59 | Attending: Emergency Medicine | Admitting: Emergency Medicine

## 2013-03-25 DIAGNOSIS — Z8619 Personal history of other infectious and parasitic diseases: Secondary | ICD-10-CM | POA: Insufficient documentation

## 2013-03-25 DIAGNOSIS — F329 Major depressive disorder, single episode, unspecified: Secondary | ICD-10-CM | POA: Insufficient documentation

## 2013-03-25 DIAGNOSIS — F3289 Other specified depressive episodes: Secondary | ICD-10-CM | POA: Insufficient documentation

## 2013-03-25 DIAGNOSIS — F411 Generalized anxiety disorder: Secondary | ICD-10-CM | POA: Insufficient documentation

## 2013-03-25 DIAGNOSIS — Z862 Personal history of diseases of the blood and blood-forming organs and certain disorders involving the immune mechanism: Secondary | ICD-10-CM | POA: Insufficient documentation

## 2013-03-25 DIAGNOSIS — Z8719 Personal history of other diseases of the digestive system: Secondary | ICD-10-CM | POA: Insufficient documentation

## 2013-03-25 DIAGNOSIS — K219 Gastro-esophageal reflux disease without esophagitis: Secondary | ICD-10-CM | POA: Insufficient documentation

## 2013-03-25 DIAGNOSIS — R252 Cramp and spasm: Secondary | ICD-10-CM | POA: Insufficient documentation

## 2013-03-25 DIAGNOSIS — Z79899 Other long term (current) drug therapy: Secondary | ICD-10-CM | POA: Insufficient documentation

## 2013-03-25 DIAGNOSIS — F17200 Nicotine dependence, unspecified, uncomplicated: Secondary | ICD-10-CM | POA: Insufficient documentation

## 2013-03-25 MED ORDER — KETOROLAC TROMETHAMINE 60 MG/2ML IM SOLN
60.0000 mg | Freq: Once | INTRAMUSCULAR | Status: AC
  Administered 2013-03-25: 60 mg via INTRAMUSCULAR
  Filled 2013-03-25: qty 2

## 2013-03-25 MED ORDER — OXYCODONE-ACETAMINOPHEN 5-325 MG PO TABS
2.0000 | ORAL_TABLET | Freq: Once | ORAL | Status: AC
  Administered 2013-03-25: 2 via ORAL
  Filled 2013-03-25: qty 2

## 2013-03-25 MED ORDER — METHOCARBAMOL 500 MG PO TABS: 500.0000 mg | ORAL_TABLET | Freq: Two times a day (BID) | ORAL | Status: DC

## 2013-03-25 MED ORDER — IBUPROFEN 800 MG PO TABS: 800.0000 mg | ORAL_TABLET | Freq: Three times a day (TID) | ORAL | Status: DC

## 2013-03-25 NOTE — ED Notes (Signed)
Pt reports using a weed eater a week and half ago. Pulled the right side of her neck. Went to [facility] and received rx for flexeril and vicodin. States medications are not helping with the neck pain. Denies nausea and vomiting. Does report a headache from the neck pain. Ptable to flex and extend head to left and right, though right is with some difficulty. Pt neuro intact. A&O x 4. States she took flexeril last night and vicodin around 1330 today.

## 2013-03-25 NOTE — ED Notes (Signed)
Per pt was seen at [facility] on Thursday and dx with neck sprain. Pt in soft collar. sts pain on the right side of her neck radiating to shoulder. Pt crying.

## 2013-03-25 NOTE — ED Provider Notes (Signed)
History     CSN: [id number]  Arrival date & time 03/25/13  1549   First MD Initiated Contact with Patient 03/25/13 1607      Chief Complaint  Patient presents with  . Neck Pain    (Consider location/radiation/quality/duration/timing/severity/associated sxs/prior treatment) HPI Comments: Patient presents emergency department with chief complaints of muscle cramps. She states that she was recently seen at the urgent care for a neck sprain which she sustained while trying to pull start a trimmer. She states that the pain has worsened. She states that the pain is severe. She is tried taking Vicodin, and Flexeril. Nothing makes her symptoms better or worse. It is aggravated with active movement.  The history is provided by the patient. No language interpreter was used.    Past Medical History  Diagnosis Date  . GERD (gastroesophageal reflux disease)   . Genital herpes   . Aphthous stomatitis   . Anxiety   . Depression   . ANEMIA-IRON DEFICIENCY 05/30/2007    History reviewed. No pertinent past surgical history.  Family History  Problem Relation Age of Onset  . Fibromyalgia Mother   . Hypertension Father     History  Substance Use Topics  . Smoking status: Current Some Day Smoker  . Smokeless tobacco: Not on file  . Alcohol Use: No    OB History   Grav Para Term Preterm Abortions TAB SAB Ect Mult Living                  Review of Systems  All other systems reviewed and are negative.    Allergies  Diflucan  Home Medications   Current Outpatient Rx  Name  Route  Sig  Dispense  Refill  . cholecalciferol (VITAMIN D) 1000 UNITS tablet   Oral   Take 1 tablet (1,000 Units total) by mouth daily.   100 tablet   3   . cyanocobalamin (COBAL-1000) 1000 MCG/ML injection      1000 mcg sq qd x 7 d, then 1000 mcg q 1 wk x 4 wks, then 1000 mcg monthly.   10 mL   6   . HYDROcodone-acetaminophen (NORCO/VICODIN) 5-325 MG per tablet   Oral   Take 1 tablet by mouth  every 4 (four) hours as needed for pain. May cause drowsiness   15 tablet   0   . hydrOXYzine (ATARAX/VISTARIL) 25 MG tablet   Oral   Take 1 tablet (25 mg total) by mouth every 8 (eight) hours as needed for anxiety.   60 tablet   2   . ranitidine (ZANTAC) 150 MG tablet   Oral   Take 150 mg by mouth 2 (two) times daily.         Marland Kitchen ibuprofen (ADVIL,MOTRIN) 800 MG tablet   Oral   Take 1 tablet (800 mg total) by mouth 3 (three) times daily.   21 tablet   0   . methocarbamol (ROBAXIN) 500 MG tablet   Oral   Take 1 tablet (500 mg total) by mouth 2 (two) times daily.   20 tablet   0   . NEEDLE, DISP, 25 G 25G X 1" MISC   Does not apply   1 each by Does not apply route See admin instructions.   10 each   2   . Syringe, Disposable, 3 ML MISC   Does not apply   1 each by Does not apply route See admin instructions.   25 each   1  BP 95/63  Pulse 72  Temp(Src) 97.1 F (36.2 C) (Oral)  Resp 16  SpO2 96%  LMP 03/11/2013  Physical Exam  Nursing note and vitals reviewed. Constitutional: She is oriented to person, place, and time. She appears well-developed and well-nourished.  HENT:  Head: Normocephalic and atraumatic.  Eyes: Conjunctivae and EOM are normal.  Neck: Normal range of motion.  Cardiovascular: Normal rate.   Pulmonary/Chest: Effort normal.  Abdominal: She exhibits no distension.  Musculoskeletal: Normal range of motion. She exhibits tenderness.  Right-sided cervical paraspinal muscles and upper right trapezius muscle groups tender to palpation: No bony injury or step-offs  Neurological: She is alert and oriented to person, place, and time.  Skin: Skin is dry.  Psychiatric: She has a normal mood and affect. Her behavior is normal. Judgment and thought content normal.    ED Course  Procedures (including critical care time)  Labs Reviewed - No data to display No results found.   1. Muscle cramps       MDM  Patient with musculoskeletal neck  pain. Suspect muscle cramps. Patient's pain is significantly relieved with Toradol and Percocet. Will discharge with ibuprofen and Robaxin, with instructions to discontinue Flexeril. Encouraged the patient to ice the affected area. Followup with orthopedics for worsening symptoms. Patient understands and agrees with the plan. She is stable and ready for discharge.        Roxy Horseman, PA-C 03/25/13 1849

## 2013-03-27 ENCOUNTER — Ambulatory Visit (INDEPENDENT_AMBULATORY_CARE_PROVIDER_SITE_OTHER): Payer: 59 | Admitting: Internal Medicine

## 2013-03-27 ENCOUNTER — Encounter: Payer: Self-pay | Admitting: Internal Medicine

## 2013-03-27 ENCOUNTER — Ambulatory Visit (INDEPENDENT_AMBULATORY_CARE_PROVIDER_SITE_OTHER)
Admission: RE | Admit: 2013-03-27 | Discharge: 2013-03-27 | Disposition: A | Discharge status: Home / Self Care | Payer: 59 | Source: Ambulatory Visit | Attending: Internal Medicine | Admitting: Internal Medicine

## 2013-03-27 VITALS — BP 112/74 | HR 76 | Temp 98.9°F | Resp 16 | Wt 138.0 lb

## 2013-03-27 DIAGNOSIS — M542 Cervicalgia: Secondary | ICD-10-CM

## 2013-03-27 DIAGNOSIS — M5481 Occipital neuralgia: Secondary | ICD-10-CM | POA: Insufficient documentation

## 2013-03-27 DIAGNOSIS — M531 Cervicobrachial syndrome: Secondary | ICD-10-CM

## 2013-03-27 HISTORY — DX: Occipital neuralgia: M54.81

## 2013-03-27 MED ORDER — CELECOXIB 200 MG PO CAPS: 200.0000 mg | ORAL_CAPSULE | Freq: Two times a day (BID) | ORAL | Status: DC

## 2013-03-27 MED ORDER — OXYCODONE-ACETAMINOPHEN 5-325 MG PO TABS: 1.0000 | ORAL_TABLET | Freq: Three times a day (TID) | ORAL | Status: DC | PRN

## 2013-03-27 MED ORDER — KETOROLAC TROMETHAMINE 60 MG/2ML IM SOLN
60.0000 mg | Freq: Once | INTRAMUSCULAR | Status: AC
  Administered 2013-03-27: 60 mg via INTRAMUSCULAR

## 2013-03-27 MED ORDER — PREGABALIN 50 MG PO CAPS: 50.0000 mg | ORAL_CAPSULE | Freq: Two times a day (BID) | ORAL | Status: DC

## 2013-03-27 NOTE — Progress Notes (Signed)
Subjective:     Neck Pain  This is a new problem. The current episode started 1 to 4 weeks ago (3 wks after she pull started her mower). The problem has been gradually worsening. The pain is associated with a twisting injury. The quality of the pain is described as stabbing. The pain is severe. The symptoms are aggravated by position. Associated symptoms include headaches. Pertinent negatives include no fever, trouble swallowing or weakness. She has tried acetaminophen for the symptoms.  Headache  This is a new problem. The current episode started 1 to 4 weeks ago (3 wks). The problem occurs constantly. The problem has been gradually worsening. The pain is located in the right unilateral and occipital region. The pain radiates to the right neck. The pain quality is not similar to prior headaches. The quality of the pain is described as stabbing. The pain is severe. Associated symptoms include neck pain. Pertinent negatives include no abdominal pain, back pain, coughing, dizziness, fever, rhinorrhea, sinus pressure, sore throat or weakness. She has tried acetaminophen and NSAIDs for the symptoms. The treatment provided no relief.      Review of Systems  Constitutional: Negative for fever, chills and fatigue.  HENT: Positive for congestion and neck pain. Negative for sore throat, rhinorrhea, mouth sores, trouble swallowing, neck stiffness, voice change and sinus pressure.   Respiratory: Negative for cough and chest tightness.   Gastrointestinal: Negative for abdominal pain and abdominal distention.  Genitourinary: Negative for flank pain.  Musculoskeletal: Negative for myalgias and back pain.  Neurological: Positive for headaches. Negative for dizziness, tremors and weakness.  Psychiatric/Behavioral: The patient is not nervous/anxious.        Objective:   Physical Exam  Constitutional: She appears distressed.  tearful w/pain  Pulmonary/Chest: She has no wheezes. She has no rales.   Musculoskeletal: She exhibits tenderness. She exhibits no edema.  R neck, R trap and R occip area - very tender  Neurological: She displays normal reflexes. No cranial nerve deficit. She exhibits normal muscle tone. Coordination normal.  Skin: No rash noted. No erythema.     Procedure Note :     R occipital nerve Injection:   Indication :  R occipital neuralgia.   Risks including unsuccessful procedure , bleeding, infection, bruising, skin atrophy and others were explained to the patient in detail as well as the benefits. Informed consent was obtained and signed.   Tthe patient was placed in a comfortable position.  occip nerve exit point was marked and  the skin was prepped with Betadine and alcohol. 11/2 inch 25-gauge needle was used. The needle was advanced perpendicular to the skin and I injected the site with 2 mL of 2% lidocaine and 20 mg of Depo-Medrol in a usual fashion.  Band-Aid applied.   Tolerated well. Complications: None. Good pain relief following the procedure.   Procedure Note :    Trigger Point Injection: R trap   Indication : Focal tender area identifiable by the location without other identifiable neurologic or musculoskeletal finding or pathology.   Risks including unsuccessful procedure , bleeding, infection, bruising, skin atrophy and others were explained to the patient in detail as well as the benefits. Informed consent was obtained and signed.   Tthe patient was placed in a comfortable position.  4  points of maximum tenderness over paraspinal and trapezius muscles were marked and  the skin was prepped with Betadine and alcohol. 1 inch 25-gauge needle was used. The needle was advanced perpendicular to the skin.  Each trigger point was injected with 1 mL of 2% lidocaine and 10 mg of Depo-Medrol in a usual fashion.  Band-Aids applied.   Tolerated well. Complications: None. Good pain relief following the procedure.       Assessment & Plan:

## 2013-03-27 NOTE — Patient Instructions (Signed)
Ice Call if not better

## 2013-03-27 NOTE — Assessment & Plan Note (Addendum)
Trap inj - see procedure X ray Celebrex Lyrica prn Percocet prn

## 2013-03-28 ENCOUNTER — Encounter: Payer: Self-pay | Admitting: Internal Medicine

## 2013-03-28 MED ORDER — METHYLPREDNISOLONE ACETATE 80 MG/ML IJ SUSP: 80.0000 mg | Freq: Once | INTRAMUSCULAR | Status: DC

## 2013-03-28 NOTE — Assessment & Plan Note (Signed)
  6/14 R See procedure See Meds: Lyrica, Roxycet, Celebrex  Potential benefits of a short term opioids use as well as potential risks (i.e. addiction risk, apnea etc) and complications (i.e. Somnolence, constipation and others) were explained to the patient and were aknowledged.

## 2013-03-28 NOTE — ED Provider Notes (Signed)
Medical screening examination/treatment/procedure(s) were performed by non-physician practitioner and as supervising physician I was immediately available for consultation/collaboration.   Christopher J. Pollina, MD 03/28/13 0902 

## 2013-04-11 ENCOUNTER — Ambulatory Visit (INDEPENDENT_AMBULATORY_CARE_PROVIDER_SITE_OTHER): Payer: 59 | Admitting: Internal Medicine

## 2013-04-11 ENCOUNTER — Encounter: Payer: Self-pay | Admitting: Internal Medicine

## 2013-04-11 VITALS — BP 104/60 | HR 72 | Temp 98.3°F | Resp 16 | Wt 139.0 lb

## 2013-04-11 DIAGNOSIS — M542 Cervicalgia: Secondary | ICD-10-CM

## 2013-04-11 DIAGNOSIS — M531 Cervicobrachial syndrome: Secondary | ICD-10-CM

## 2013-04-11 DIAGNOSIS — M5481 Occipital neuralgia: Secondary | ICD-10-CM

## 2013-04-11 MED ORDER — HYDROXYZINE HCL 25 MG PO TABS: 25.0000 mg | ORAL_TABLET | Freq: Three times a day (TID) | ORAL | Status: DC | PRN

## 2013-04-11 MED ORDER — IBUPROFEN 600 MG PO TABS: 600.0000 mg | ORAL_TABLET | Freq: Three times a day (TID) | ORAL | Status: DC | PRN

## 2013-04-11 MED ORDER — OXYCODONE-ACETAMINOPHEN 5-325 MG PO TABS: 1.0000 | ORAL_TABLET | Freq: Three times a day (TID) | ORAL | Status: DC | PRN

## 2013-04-11 MED ORDER — RANITIDINE HCL 150 MG PO TABS: 150.0000 mg | ORAL_TABLET | Freq: Two times a day (BID) | ORAL | Status: DC

## 2013-04-11 NOTE — Progress Notes (Signed)
Patient ID: Carmen Hoffman, female   DOB: 31-Dec-1970, 42 y.o.   MRN: [id number]  Subjective:     Neck Pain  This is a new problem. The current episode started 1 to 4 weeks ago (3 wks after she pull started her mower). The problem occurs intermittently. The problem has been gradually improving. The pain is associated with a twisting injury. The quality of the pain is described as stabbing. The pain is at a severity of 2/10. The pain is mild. The symptoms are aggravated by position. Associated symptoms include headaches. Pertinent negatives include no fever, trouble swallowing or weakness. She has tried acetaminophen, NSAIDs and oral narcotics for the symptoms. The treatment provided significant relief.  Headache  This is a new problem. The current episode started 1 to 4 weeks ago (3 wks). The problem occurs constantly. The problem has been gradually improving. The pain is located in the right unilateral and occipital region. The pain radiates to the right neck. The pain quality is not similar to prior headaches. The quality of the pain is described as stabbing. The pain is at a severity of 1/10. The pain is mild. Associated symptoms include neck pain. Pertinent negatives include no abdominal pain, back pain, coughing, dizziness, fever, rhinorrhea, sinus pressure, sore throat or weakness. She has tried acetaminophen and NSAIDs for the symptoms. The treatment provided no relief.      Review of Systems  Constitutional: Negative for fever, chills and fatigue.  HENT: Positive for neck pain. Negative for congestion, sore throat, rhinorrhea, mouth sores, trouble swallowing, neck stiffness, voice change and sinus pressure.   Respiratory: Negative for cough and chest tightness.   Gastrointestinal: Negative for abdominal pain and abdominal distention.  Genitourinary: Negative for flank pain.  Musculoskeletal: Negative for myalgias and back pain.  Neurological: Positive for headaches. Negative for dizziness,  tremors and weakness.  Psychiatric/Behavioral: The patient is not nervous/anxious.        Objective:   Physical Exam  Constitutional: No distress.  Pulmonary/Chest: She has no wheezes. She has no rales.  Musculoskeletal: She exhibits tenderness. She exhibits no edema.  R neck, R trap and R occip area - a little tender  Neurological: She displays normal reflexes. No cranial nerve deficit. She exhibits normal muscle tone. Coordination normal.  Skin: No rash noted. No erythema.             Assessment & Plan:

## 2013-04-11 NOTE — Assessment & Plan Note (Signed)
Resolving

## 2013-04-11 NOTE — Assessment & Plan Note (Signed)
Resolving Ibuprofen 600 mg tid Percocet prn - weaning off

## 2013-05-17 ENCOUNTER — Encounter: Payer: Self-pay | Admitting: Internal Medicine

## 2013-05-17 ENCOUNTER — Ambulatory Visit (INDEPENDENT_AMBULATORY_CARE_PROVIDER_SITE_OTHER): Payer: 59 | Admitting: Internal Medicine

## 2013-05-17 VITALS — BP 100/60 | HR 80 | Temp 99.1°F | Resp 16 | Wt 138.0 lb

## 2013-05-17 DIAGNOSIS — M542 Cervicalgia: Secondary | ICD-10-CM

## 2013-05-17 DIAGNOSIS — M531 Cervicobrachial syndrome: Secondary | ICD-10-CM

## 2013-05-17 DIAGNOSIS — M5481 Occipital neuralgia: Secondary | ICD-10-CM

## 2013-05-17 DIAGNOSIS — E538 Deficiency of other specified B group vitamins: Secondary | ICD-10-CM

## 2013-05-17 DIAGNOSIS — G44209 Tension-type headache, unspecified, not intractable: Secondary | ICD-10-CM

## 2013-05-17 MED ORDER — OXYCODONE-ACETAMINOPHEN 5-325 MG PO TABS: 1.0000 | ORAL_TABLET | Freq: Two times a day (BID) | ORAL | Status: DC | PRN

## 2013-05-17 NOTE — Assessment & Plan Note (Signed)
Occup health ref - [facility]

## 2013-05-17 NOTE — Assessment & Plan Note (Addendum)
8/14 B MSK R>L Occup health consult Work less PT recommended  See meds

## 2013-05-17 NOTE — Progress Notes (Signed)
   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 The current episode started more than 1 month ago (3 wks after she pull started her mower). The problem occurs constantly. The problem has been waxing and waning. Associated with: work. The pain is present in the occipital region, right side and left side. The quality of the pain is described as aching, burning and shooting. The pain is at a severity of 7/10. The pain is moderate. The symptoms are aggravated by position (work). Stiffness is present all day. Associated symptoms include headaches. Pertinent negatives include no fever, trouble swallowing or weakness. She has tried acetaminophen, NSAIDs and oral narcotics for the symptoms. The treatment provided mild relief.  Headache  This is a new problem. The current episode started more than 1 month ago (3 wks). The problem occurs constantly. The problem has been unchanged. The pain is located in the occipital and bilateral region. The pain does not radiate. The pain quality is similar to prior headaches. The quality of the pain is described as stabbing, aching and dull. The pain is at a severity of 3/10. The pain is mild. Associated symptoms include back pain and neck pain. Pertinent negatives include no abdominal pain, coughing, dizziness, fever, rhinorrhea, sinus pressure, sore throat or weakness. She has tried acetaminophen and NSAIDs for the symptoms. The treatment provided no relief.  Shoulder Pain  The pain is present in the neck, left shoulder and right shoulder. This is a chronic problem. The current episode started more than 1 month ago. There has been no history of extremity trauma. The problem occurs constantly. The problem has been unchanged. The pain is at a severity of 5/10. The pain is moderate. Pertinent negatives include no fever. She has tried NSAIDS and oral narcotics for the symptoms. The  treatment provided moderate relief. Family history does not include gout. There is no history of osteoarthritis or rheumatoid arthritis.      Review of Systems  Constitutional: Negative for fever, chills and fatigue.  HENT: Positive for neck pain. Negative for congestion, sore throat, rhinorrhea, mouth sores, trouble swallowing, neck stiffness, voice change and sinus pressure.   Respiratory: Negative for cough and chest tightness.   Gastrointestinal: Negative for abdominal pain and abdominal distention.  Genitourinary: Negative for flank pain.  Musculoskeletal: Positive for back pain. Negative for myalgias.  Neurological: Positive for headaches. Negative for dizziness, tremors and weakness.  Psychiatric/Behavioral: The patient is not nervous/anxious.        Objective:   Physical Exam  Constitutional: She appears well-developed. No distress.  HENT:  Head: Normocephalic.  Neck: No thyromegaly present.  Cardiovascular: Normal rate.  Exam reveals no gallop.   No murmur heard. Pulmonary/Chest: She has no wheezes. She has no rales.  Musculoskeletal: She exhibits tenderness. She exhibits no edema.  R neck, R trap and R occip area - a little tender  Lymphadenopathy:    She has no cervical adenopathy.  Neurological: She displays normal reflexes. No cranial nerve deficit. She exhibits normal muscle tone. Coordination normal.  Skin: No rash noted. No erythema. There is pallor.  Psychiatric: She has a normal mood and affect. Judgment normal.             Assessment & Plan:

## 2013-05-17 NOTE — Assessment & Plan Note (Signed)
Resolved

## 2013-05-22 ENCOUNTER — Encounter: Payer: Self-pay | Admitting: Internal Medicine

## 2013-05-22 NOTE — Assessment & Plan Note (Signed)
Continue with current prescription therapy as reflected on the Med list.  

## 2013-07-06 ENCOUNTER — Other Ambulatory Visit: Payer: Self-pay | Admitting: Internal Medicine

## 2013-07-12 ENCOUNTER — Ambulatory Visit (INDEPENDENT_AMBULATORY_CARE_PROVIDER_SITE_OTHER): Payer: 59 | Admitting: Internal Medicine

## 2013-07-12 ENCOUNTER — Encounter: Payer: Self-pay | Admitting: Internal Medicine

## 2013-07-12 VITALS — BP 98/62 | HR 72 | Temp 98.7°F | Resp 12 | Wt 141.0 lb

## 2013-07-12 DIAGNOSIS — L0231 Cutaneous abscess of buttock: Secondary | ICD-10-CM

## 2013-07-12 DIAGNOSIS — M542 Cervicalgia: Secondary | ICD-10-CM

## 2013-07-12 DIAGNOSIS — L0232 Furuncle of buttock: Secondary | ICD-10-CM

## 2013-07-12 DIAGNOSIS — E538 Deficiency of other specified B group vitamins: Secondary | ICD-10-CM

## 2013-07-12 DIAGNOSIS — L0233 Carbuncle of buttock: Secondary | ICD-10-CM

## 2013-07-12 HISTORY — DX: Cutaneous abscess of buttock: L02.31

## 2013-07-12 MED ORDER — OXYCODONE-ACETAMINOPHEN 5-325 MG PO TABS: 1.0000 | ORAL_TABLET | Freq: Two times a day (BID) | ORAL | Status: DC | PRN

## 2013-07-12 MED ORDER — DOXYCYCLINE HYCLATE 100 MG PO TABS: 100.0000 mg | ORAL_TABLET | Freq: Two times a day (BID) | ORAL | Status: DC

## 2013-07-12 MED ORDER — MUPIROCIN 2 % EX OINT: TOPICAL_OINTMENT | CUTANEOUS | Status: DC

## 2013-07-12 NOTE — Assessment & Plan Note (Signed)
See procedure 

## 2013-07-12 NOTE — Assessment & Plan Note (Signed)
Continue with current prescription therapy as reflected on the Med list.  

## 2013-07-12 NOTE — Progress Notes (Signed)
Subjective:    HPI C/o boil on buttock - L cheek x 1 week F/u on B12 and Vit D def  F/u neck pain - better, however has to use pain meds prn F/u fatigue - better F/u  GERD - better  Review of Systems  Constitutional: Positive for fatigue. Negative for chills, activity change, appetite change and unexpected weight change.  HENT: Negative for congestion, mouth sores and sinus pressure.   Eyes: Negative for visual disturbance.  Respiratory: Negative for cough and chest tightness.   Gastrointestinal: Negative for nausea and abdominal pain.  Genitourinary: Positive for frequency. Negative for difficulty urinating and vaginal pain.  Musculoskeletal: Negative for back pain and gait problem.  Skin: Negative for pallor and rash.  Neurological: Negative for dizziness, tremors, weakness, numbness and headaches.  Psychiatric/Behavioral: Negative for confusion and sleep disturbance.   BP Readings from Last 3 Encounters:  07/12/13 98/62  05/17/13 100/60  04/11/13 104/60   Wt Readings from Last 3 Encounters:  07/12/13 141 lb (63.957 kg)  05/17/13 138 lb (62.596 kg)  04/11/13 139 lb (63.05 kg)        Objective:   Physical Exam  Constitutional: She appears well-developed. No distress.  HENT:  Head: Normocephalic.  Right Ear: External ear normal.  Left Ear: External ear normal.  Nose: Nose normal.  Mouth/Throat: Oropharynx is clear and moist.  Eyes: Conjunctivae are normal. Pupils are equal, round, and reactive to light. Right eye exhibits no discharge. Left eye exhibits no discharge.  Neck: Normal range of motion. Neck supple. No JVD present. No tracheal deviation present. No thyromegaly present.  Cardiovascular: Normal rate, regular rhythm and normal heart sounds.   Pulmonary/Chest: No stridor. No respiratory distress. She has no wheezes.  Abdominal: Soft. Bowel sounds are normal. She exhibits no distension and no mass. There is no tenderness. There is no rebound and no guarding.   Musculoskeletal: She exhibits no edema and no tenderness.  Lymphadenopathy:    She has no cervical adenopathy.  Neurological: She displays normal reflexes. No cranial nerve deficit. She exhibits normal muscle tone. Coordination normal.  Skin: No rash noted. No erythema.  Psychiatric: She has a normal mood and affect. Her behavior is normal. Judgment and thought content normal.   A 1 cm soft boil on buttock - L cheek distal aspect and 3 cm erythema; tender  Lab Results  Component Value Date   WBC 7.6 07/18/2012   HGB 12.9 07/18/2012   HCT 38.8 07/18/2012   PLT 165.0 07/18/2012   GLUCOSE 90 07/18/2012   CHOL 163 07/18/2012   TRIG 112.0 07/18/2012   HDL 41.40 07/18/2012   LDLCALC 99 07/18/2012   ALT 11 10/17/2012   AST 16 10/17/2012   NA 137 07/18/2012   K 4.0 07/18/2012   CL 104 07/18/2012   CREATININE 0.6 07/18/2012   BUN 13 07/18/2012   CO2 27 07/18/2012   TSH 3.14 07/18/2012    Procedure note:  Incision and Drainage of an Abscess   Indication : a localized collection of pus that is tender and not spontaneously resolving.    Risks including unsuccessful procedure , possible need for a repeat procedure due to pus accumulation, scar formation, and others as well as benefits were explained to the patient in detail. Written consent was obtained/signed.    The patient was placed in a decubitus position. The area of an abscess was prepped with povidone-iodine and draped in a sterile fashion. Local anesthesia with  A cooling spray was  administered.  0.5 cm incision with #11strait blade was made. About 2 cc of purulent material was expressed. The abscess cavity was  The cavity was irrigated with the  the anesthetic in the syringe.  The wound was dressed with antibiotic ointment and a large band aid.  Tolerated well. Complications: None.         Assessment & Plan:

## 2013-07-12 NOTE — Patient Instructions (Addendum)
Wound instructions : change dressing once a day or twice a day is needed. Change dressing after  shower in the morning.  Pat dry the wound with gauze.Re-dress wound with antibiotic ointment and  a Band-Aid of appropriate size.   Please contact us if you notice a recollection of pus in the abscess fever and chills increased pain redness red streaks near the abscess increased swelling in the area. 

## 2013-07-12 NOTE — Assessment & Plan Note (Signed)
Employee health consult Continue with current prn  prescription therapy as reflected on the Med list.

## 2013-07-20 ENCOUNTER — Ambulatory Visit: Payer: 59 | Attending: Internal Medicine

## 2013-07-20 DIAGNOSIS — IMO0001 Reserved for inherently not codable concepts without codable children: Secondary | ICD-10-CM | POA: Insufficient documentation

## 2013-07-20 DIAGNOSIS — M542 Cervicalgia: Secondary | ICD-10-CM | POA: Insufficient documentation

## 2013-07-20 DIAGNOSIS — R5381 Other malaise: Secondary | ICD-10-CM | POA: Insufficient documentation

## 2013-07-25 ENCOUNTER — Ambulatory Visit: Payer: 59 | Admitting: Physical Therapy

## 2013-07-30 ENCOUNTER — Other Ambulatory Visit: Payer: Self-pay | Admitting: Internal Medicine

## 2013-08-01 ENCOUNTER — Ambulatory Visit: Payer: 59 | Admitting: Physical Therapy

## 2013-08-25 ENCOUNTER — Other Ambulatory Visit: Payer: Self-pay

## 2013-08-25 DIAGNOSIS — Z1231 Encounter for screening mammogram for malignant neoplasm of breast: Secondary | ICD-10-CM

## 2013-09-13 ENCOUNTER — Telehealth: Payer: Self-pay | Admitting: Internal Medicine

## 2013-09-13 DIAGNOSIS — M542 Cervicalgia: Secondary | ICD-10-CM

## 2013-09-13 NOTE — Telephone Encounter (Signed)
OK to fill this prescription with additional refills x0 Thank you!  

## 2013-09-13 NOTE — Telephone Encounter (Signed)
pls do labs - orders were in Thx

## 2013-09-13 NOTE — Telephone Encounter (Signed)
Pt request refill for oxycodone. Please advise.  °

## 2013-09-14 MED ORDER — OXYCODONE-ACETAMINOPHEN 5-325 MG PO TABS: 1.0000 | ORAL_TABLET | Freq: Two times a day (BID) | ORAL | Status: DC | PRN

## 2013-09-14 NOTE — Addendum Note (Signed)
Addended by: Merrilyn Puma on: 09/14/2013 09:26 AM   Modules accepted: Orders

## 2013-09-14 NOTE — Telephone Encounter (Signed)
Rx printed/ready for p/u. Left detailed mess informing pt of below.

## 2013-09-15 ENCOUNTER — Other Ambulatory Visit: Payer: 59

## 2013-09-15 DIAGNOSIS — M542 Cervicalgia: Secondary | ICD-10-CM

## 2013-09-16 LAB — DRUG SCREEN, URINE
Amphetamine Screen, Ur: POSITIVE — AB
Benzodiazepines.: NEGATIVE
Creatinine,U: 262.08 mg/dL
Opiates: NEGATIVE
Phencyclidine (PCP): NEGATIVE
Propoxyphene: NEGATIVE

## 2013-09-17 ENCOUNTER — Other Ambulatory Visit: Payer: Self-pay | Admitting: Internal Medicine

## 2013-09-22 ENCOUNTER — Encounter: Payer: Self-pay | Admitting: Internal Medicine

## 2013-09-22 ENCOUNTER — Ambulatory Visit: Payer: 59 | Admitting: *Deleted

## 2013-09-22 ENCOUNTER — Ambulatory Visit (INDEPENDENT_AMBULATORY_CARE_PROVIDER_SITE_OTHER): Payer: 59 | Admitting: Internal Medicine

## 2013-09-22 VITALS — BP 100/62 | HR 76 | Temp 98.3°F | Resp 16 | Wt 131.0 lb

## 2013-09-22 DIAGNOSIS — M542 Cervicalgia: Secondary | ICD-10-CM

## 2013-09-22 NOTE — Progress Notes (Signed)
Pre visit review using our clinic review tool, if applicable. No additional management support is needed unless otherwise documented below in the visit note. 

## 2013-09-22 NOTE — Assessment & Plan Note (Addendum)
Pt states she never took any illicit drugs an that she took Percocet on the morning of the test. Will repeat drug screen today. Will prescribe Percocet if repeat test is negative.

## 2013-09-23 LAB — DRUG SCREEN, URINE
Barbiturate Quant, Ur: NEGATIVE
Cocaine Metabolites: NEGATIVE
Creatinine,U: 143.18 mg/dL

## 2013-09-24 ENCOUNTER — Encounter: Payer: Self-pay | Admitting: Internal Medicine

## 2013-09-25 MED ORDER — OXYCODONE-ACETAMINOPHEN 5-325 MG PO TABS: 1.0000 | ORAL_TABLET | Freq: Two times a day (BID) | ORAL | Status: DC | PRN

## 2013-09-26 ENCOUNTER — Telehealth: Payer: Self-pay | Admitting: Internal Medicine

## 2013-09-26 NOTE — Telephone Encounter (Signed)
09/26/2013 Pt wants to speak with Stacey in regards to lab work that was done. Please contact pt at [phone] ° °

## 2013-09-26 NOTE — Telephone Encounter (Signed)
Pt informed of below:  Dear Mrs Narvaiz  Your urine drug screen test was appropriate. You can pick up a prescription. We will have to repeat this test in 1 month.  Sincerely,  Sonda Primes, MD

## 2013-09-26 NOTE — Telephone Encounter (Signed)
09/26/2013 Pt wants to speak with Misty Stanley in regards to lab work that was done. Please contact pt at [phone]

## 2013-09-29 ENCOUNTER — Ambulatory Visit
Admission: RE | Admit: 2013-09-29 | Discharge: 2013-09-29 | Disposition: A | Discharge status: Home / Self Care | Payer: 59 | Source: Ambulatory Visit

## 2013-09-29 DIAGNOSIS — Z1231 Encounter for screening mammogram for malignant neoplasm of breast: Secondary | ICD-10-CM

## 2013-10-16 ENCOUNTER — Other Ambulatory Visit: Payer: 59

## 2013-10-16 DIAGNOSIS — M542 Cervicalgia: Secondary | ICD-10-CM

## 2013-10-17 LAB — DRUG SCREEN, URINE
Amphetamine Screen, Ur: POSITIVE — AB
BARBITURATE QUANT UR: NEGATIVE
Benzodiazepines.: NEGATIVE
COCAINE METABOLITES: NEGATIVE
Creatinine,U: 277.32 mg/dL
MARIJUANA METABOLITE: NEGATIVE
Methadone: NEGATIVE
Opiates: POSITIVE — AB
PHENCYCLIDINE (PCP): POSITIVE — AB
Propoxyphene: NEGATIVE

## 2013-10-23 ENCOUNTER — Telehealth: Payer: Self-pay | Admitting: Internal Medicine

## 2013-10-23 NOTE — Telephone Encounter (Signed)
10/22/2013  Pt would like for you to contact her asap regarding test results.  thanks

## 2013-10-23 NOTE — Telephone Encounter (Signed)
I called pt- she is upset with her urine drug screen results. She is concerned that she could lose her job and that this looks bad. She states she has not taken or used any elicit drugs. She implied that her specimen must have been switched or tampered with prior to testing. I advised her to schedule OV and speak to MD. She is scheduled 10/25/13.

## 2013-10-25 ENCOUNTER — Encounter: Payer: Self-pay | Admitting: Internal Medicine

## 2013-10-25 ENCOUNTER — Ambulatory Visit (INDEPENDENT_AMBULATORY_CARE_PROVIDER_SITE_OTHER): Payer: 59 | Admitting: Internal Medicine

## 2013-10-25 ENCOUNTER — Other Ambulatory Visit: Payer: Self-pay | Admitting: *Deleted

## 2013-10-25 VITALS — BP 94/58 | HR 84 | Temp 97.4°F | Resp 16 | Wt 127.0 lb

## 2013-10-25 DIAGNOSIS — M542 Cervicalgia: Secondary | ICD-10-CM

## 2013-10-25 DIAGNOSIS — F411 Generalized anxiety disorder: Secondary | ICD-10-CM

## 2013-10-25 MED ORDER — HYDROXYZINE HCL 25 MG PO TABS: ORAL_TABLET | ORAL | Status: DC

## 2013-10-25 NOTE — Assessment & Plan Note (Signed)
6/14 x3 wks acute 8/14 B MSK, R>L (+) drug screen 1/15 I'll not be able to prescribe controlled substances. Pt is aware

## 2013-10-25 NOTE — Progress Notes (Signed)
Pre visit review using our clinic review tool, if applicable. No additional management support is needed unless otherwise documented below in the visit note. 

## 2013-10-25 NOTE — Progress Notes (Signed)
   Subjective:    HPI  Pt is here upset about her drug screen test being (+) She denies using any ilicit drugs.  Review of Systems     Objective:   Physical Exam NE       Assessment & Plan:

## 2013-10-26 NOTE — Assessment & Plan Note (Signed)
I refilled Carmen Hoffman's Hydroxyzine. Use w/caution

## 2013-10-30 ENCOUNTER — Telehealth: Payer: Self-pay | Admitting: *Deleted

## 2013-10-30 NOTE — Telephone Encounter (Signed)
Patient phoned requesting u/a results from last week.  Please advise.   CB# [phone]

## 2013-10-30 NOTE — Telephone Encounter (Signed)
Report is still in progress - n/a Thx

## 2013-11-01 NOTE — Telephone Encounter (Signed)
Left detailed mess informing pt results are not resulted.

## 2013-11-01 NOTE — Telephone Encounter (Signed)
Pt called again to request test results

## 2013-11-10 ENCOUNTER — Telehealth: Payer: Self-pay | Admitting: Internal Medicine

## 2013-11-10 NOTE — Telephone Encounter (Signed)
Relevant patient education assigned to patient using Emmi. ° °

## 2013-12-05 ENCOUNTER — Encounter: Payer: Self-pay | Admitting: Internal Medicine

## 2014-03-19 ENCOUNTER — Other Ambulatory Visit: Payer: Self-pay | Admitting: *Deleted

## 2014-03-19 MED ORDER — CYANOCOBALAMIN 1000 MCG/ML IJ SOLN: 1000.0000 ug | INTRAMUSCULAR | Status: DC

## 2014-03-19 MED ORDER — RANITIDINE HCL 150 MG PO TABS: 150.0000 mg | ORAL_TABLET | Freq: Two times a day (BID) | ORAL | Status: DC

## 2014-03-19 MED ORDER — "NEEDLE (DISP) 25G X 1"" MISC": 1.0000 | Status: DC

## 2014-03-21 ENCOUNTER — Telehealth: Payer: Self-pay | Admitting: Internal Medicine

## 2014-03-21 DIAGNOSIS — M542 Cervicalgia: Secondary | ICD-10-CM

## 2014-03-21 DIAGNOSIS — M545 Low back pain, unspecified: Secondary | ICD-10-CM

## 2014-03-21 NOTE — Telephone Encounter (Signed)
Ok done Thx 

## 2014-03-21 NOTE — Telephone Encounter (Signed)
Pt would like a referral to [facility] on [place],  [place] [phone]  Or fax [phone].  For neck and hip pain.

## 2014-03-22 NOTE — Telephone Encounter (Signed)
Pt is aware that [facility] will contact her with an appt.

## 2014-06-06 ENCOUNTER — Encounter: Payer: Self-pay | Admitting: Internal Medicine

## 2014-06-06 ENCOUNTER — Ambulatory Visit (INDEPENDENT_AMBULATORY_CARE_PROVIDER_SITE_OTHER): Payer: 59 | Admitting: Internal Medicine

## 2014-06-06 VITALS — BP 102/64 | HR 75 | Temp 98.4°F | Resp 14 | Wt 129.8 lb

## 2014-06-06 DIAGNOSIS — F17200 Nicotine dependence, unspecified, uncomplicated: Secondary | ICD-10-CM

## 2014-06-06 DIAGNOSIS — K2100 Gastro-esophageal reflux disease with esophagitis, without bleeding: Secondary | ICD-10-CM

## 2014-06-06 DIAGNOSIS — R070 Pain in throat: Secondary | ICD-10-CM

## 2014-06-06 DIAGNOSIS — Z720 Tobacco use: Secondary | ICD-10-CM | POA: Insufficient documentation

## 2014-06-06 DIAGNOSIS — M531 Cervicobrachial syndrome: Secondary | ICD-10-CM

## 2014-06-06 DIAGNOSIS — E538 Deficiency of other specified B group vitamins: Secondary | ICD-10-CM

## 2014-06-06 DIAGNOSIS — M5481 Occipital neuralgia: Secondary | ICD-10-CM

## 2014-06-06 MED ORDER — CYANOCOBALAMIN 1000 MCG/ML IJ SOLN: 1000.0000 ug | INTRAMUSCULAR | Status: DC

## 2014-06-06 MED ORDER — METHYLPREDNISOLONE ACETATE 40 MG/ML IJ SUSP: 20.0000 mg | Freq: Once | INTRAMUSCULAR | Status: DC

## 2014-06-06 MED ORDER — TRAMADOL HCL 50 MG PO TABS: 50.0000 mg | ORAL_TABLET | Freq: Two times a day (BID) | ORAL | Status: DC | PRN

## 2014-06-06 MED ORDER — PREDNISONE 10 MG PO TABS: ORAL_TABLET | ORAL | Status: DC

## 2014-06-06 MED ORDER — GABAPENTIN 100 MG PO CAPS: 100.0000 mg | ORAL_CAPSULE | Freq: Three times a day (TID) | ORAL | Status: DC | PRN

## 2014-06-06 NOTE — Assessment & Plan Note (Addendum)
Continue with B12 therapy - start inj q 2 wks GI ref

## 2014-06-06 NOTE — Assessment & Plan Note (Addendum)
On Ranitidine S/p ENT evaluation - Dr Warren Danes C/o ST - chronic - s/p ENT eval: cont to smoke 1.5 PPD GI ref

## 2014-06-06 NOTE — Assessment & Plan Note (Signed)
8/15 Left  Pt asked for an injection

## 2014-06-06 NOTE — Progress Notes (Signed)
Pre visit review using our clinic review tool, if applicable. No additional management support is needed unless otherwise documented below in the visit note. 

## 2014-06-06 NOTE — Assessment & Plan Note (Signed)
Pt cont to smoke 1.5 PPD Discussed

## 2014-06-06 NOTE — Progress Notes (Signed)
  Subjective:     Neck Pain  This is a new problem. The current episode started in the past 7 days. The problem has been gradually worsening. The pain is associated with a twisting injury. The quality of the pain is described as stabbing. The pain is severe. The symptoms are aggravated by position. Associated symptoms include headaches. Pertinent negatives include no fever, trouble swallowing or weakness. She has tried acetaminophen for the symptoms.  Headache  This is a new problem. The current episode started in the past 7 days. The problem occurs constantly. The problem has been gradually worsening. The pain is located in the right unilateral and occipital region. The pain radiates to the right neck. The pain quality is not similar to prior headaches. The quality of the pain is described as stabbing. The pain is severe. Associated symptoms include neck pain. Pertinent negatives include no abdominal pain, back pain, coughing, dizziness, fever, rhinorrhea, sinus pressure, sore throat or weakness. She has tried acetaminophen and NSAIDs for the symptoms. The treatment provided no relief.   C/o ST - chronic - s/p ENT eval: cont to smoke 1.5 PPD F/u GERD, B12 def   Review of Systems  Constitutional: Negative for fever, chills and fatigue.  HENT: Positive for congestion. Negative for mouth sores, rhinorrhea, sinus pressure, sore throat, trouble swallowing and voice change.   Respiratory: Negative for cough and chest tightness.   Gastrointestinal: Negative for abdominal pain and abdominal distention.  Genitourinary: Negative for flank pain.  Musculoskeletal: Positive for neck pain. Negative for back pain, myalgias and neck stiffness.  Neurological: Positive for headaches. Negative for dizziness, tremors and weakness.  Psychiatric/Behavioral: The patient is not nervous/anxious.        Objective:   Physical Exam  Constitutional: She appears distressed.  tearful w/pain  Pulmonary/Chest: She has  no wheezes. She has no rales.  Musculoskeletal: She exhibits tenderness. She exhibits no edema.  R neck, R trap and R occip area - very tender  Neurological: She displays normal reflexes. No cranial nerve deficit. She exhibits normal muscle tone. Coordination normal.  Skin: No rash noted. No erythema.     Procedure Note :     L occipital nerve Injection:   Indication : L occipital neuralgia.   Risks including unsuccessful procedure , bleeding, infection, bruising, skin atrophy and others were explained to the patient in detail as well as the benefits. Informed consent was obtained and signed.   Tthe patient was placed in a comfortable position.  occip nerve exit point was marked and  the skin was prepped with Betadine and alcohol. 11/2 inch 25-gauge needle was used. The needle was advanced perpendicular to the skin and I injected the site with 2 mL of 2% lidocaine and 20 mg of Depo-Medrol in a usual fashion.  Band-Aid applied.   Tolerated well. Complications: None. Fair pain relief following the procedure.       Assessment & Plan:

## 2014-06-06 NOTE — Assessment & Plan Note (Signed)
2015 s/p ENT eval

## 2014-06-08 ENCOUNTER — Encounter: Payer: Self-pay | Admitting: Gastroenterology

## 2014-06-12 ENCOUNTER — Encounter: Payer: Self-pay | Admitting: Gastroenterology

## 2014-06-12 ENCOUNTER — Ambulatory Visit (INDEPENDENT_AMBULATORY_CARE_PROVIDER_SITE_OTHER): Payer: 59 | Admitting: Gastroenterology

## 2014-06-12 VITALS — BP 104/64 | HR 62 | Ht 61.0 in | Wt 123.0 lb

## 2014-06-12 DIAGNOSIS — R1314 Dysphagia, pharyngoesophageal phase: Secondary | ICD-10-CM

## 2014-06-12 DIAGNOSIS — F449 Dissociative and conversion disorder, unspecified: Secondary | ICD-10-CM

## 2014-06-12 DIAGNOSIS — R09A2 Foreign body sensation, throat: Secondary | ICD-10-CM

## 2014-06-12 DIAGNOSIS — F458 Other somatoform disorders: Secondary | ICD-10-CM

## 2014-06-12 NOTE — Progress Notes (Signed)
HPI: This is a    very pleasant 43 year old woman who works as a Charity fundraiser at [employer]  Sore throat chronically. Globus sensation.  Rare intermittent solid food dysphagia.  Overall stable weight.    Every swallow, even saliva causes globus.  Takes ranitidine twice daily, has never tried PPI.  No esophagus cancer in family.    Review of systems: Pertinent positive and negative review of systems were noted in the above HPI section. Complete review of systems was performed and was otherwise normal.    Past Medical History  Diagnosis Date  . GERD (gastroesophageal reflux disease)   . Genital herpes   . Aphthous stomatitis   . Anxiety   . Depression   . ANEMIA-IRON DEFICIENCY 05/30/2007    Past Surgical History  Procedure Laterality Date  . Blood vessel      repair 23 years ago    Current Outpatient Prescriptions  Medication Sig Dispense Refill  . cyanocobalamin (,VITAMIN B-12,) 1000 MCG/ML injection Inject 1 mL (1,000 mcg total) into the skin every 14 (fourteen) days.  10 mL  6  . gabapentin (NEURONTIN) 100 MG capsule Take 1-2 capsules (100-200 mg total) by mouth 3 (three) times daily as needed.  60 capsule  0  . NEEDLE, DISP, 25 G 25G X 1" MISC 1 each by Does not apply route See admin instructions.  10 each  2  . predniSONE (DELTASONE) 10 MG tablet Prednisone 10 mg: take 4 tabs a day x 3 days; then 3 tabs a day x 4 days; then 2 tabs a day x 4 days, then 1 tab a day x 6 days, then stop. Take pc.  38 tablet  0  . ranitidine (ZANTAC) 150 MG tablet Take 1 tablet (150 mg total) by mouth 2 (two) times daily.  60 tablet  5  . Syringe, Disposable, 3 ML MISC 1 each by Does not apply route See admin instructions.  25 each  1  . traMADol (ULTRAM) 50 MG tablet Take 1-2 tablets (50-100 mg total) by mouth 2 (two) times daily as needed.  20 tablet  0   Current Facility-Administered Medications  Medication Dose Route Frequency Provider Last Rate Last Dose  .  methylPREDNISolone acetate (DEPO-MEDROL) injection 20 mg  20 mg Intra-Lesional Once Aleksei Plotnikov V, MD      . methylPREDNISolone acetate (DEPO-MEDROL) injection 80 mg  80 mg Intramuscular Once Lew Dawes V, MD        Allergies as of 06/12/2014 - Review Complete 06/12/2014  Allergen Reaction Noted  . Diflucan [fluconazole]  01/01/2013    Family History  Problem Relation Age of Onset  . Fibromyalgia Mother   . Hypertension Father     History   Social History  . Marital Status: Married    Spouse Name: N/A    Number of Children: N/A  . Years of Education: N/A   Occupational History  . Not on file.   Social History Main Topics  . Smoking status: Current Some Day Smoker -- 1.50 packs/day    Types: Cigarettes  . Smokeless tobacco: Never Used  . Alcohol Use: No  . Drug Use: No  . Sexual Activity: Not on file   Other Topics Concern  . Not on file   Social History Narrative  . No narrative on file       Physical Exam: BP 104/64  Pulse 62  Ht 5\' 1"  (1.549 m)  Wt 123 lb (55.792 kg)  BMI 23.25 kg/m2 Constitutional:  generally well-appearing Psychiatric: alert and oriented x3 Eyes: extraocular movements intact Mouth: oral pharynx moist, no lesions Neck: supple no lymphadenopathy Cardiovascular: heart regular rate and rhythm Lungs: clear to auscultation bilaterally Abdomen: soft, nontender, nondistended, no obvious ascites, no peritoneal signs, normal bowel sounds Extremities: no lower extremity edema bilaterally Skin: no lesions on visible extremities    Assessment and plan: 43 y.o. female with  chronic globus sensation, intermittent nonprogressive dysphasia  I did not mention above that she has had ear nose and throat evaluation including a barium esophagram about 2 years ago and it was normal. She is going to be scheduled for an upper endoscopy, she is concerned about the possibility of underlying cancer which I doubt to be the case. Perhaps this is  acid related. She is taking H2 blockers twice daily and I will have her stop that and instead begin over-the-counter proton pump inhibitor once every morning. I see no reason for any blood work or imaging studies prior to upper endoscopy.

## 2014-06-12 NOTE — Patient Instructions (Signed)
Stop the ranitidine. Take once daily OTC prilosec or prevacid (buy generic version).  Take one pill every 20-30 minutes prior to first meal of the day. You will be set up for an upper endoscopy for globus sensation.

## 2014-07-27 ENCOUNTER — Other Ambulatory Visit: Payer: Self-pay

## 2014-08-17 ENCOUNTER — Encounter: Payer: Self-pay | Admitting: Gastroenterology

## 2014-08-17 ENCOUNTER — Ambulatory Visit (AMBULATORY_SURGERY_CENTER): Payer: 59 | Admitting: Gastroenterology

## 2014-08-17 VITALS — BP 109/70 | HR 67 | Temp 97.3°F | Resp 52 | Ht 61.0 in | Wt 123.0 lb

## 2014-08-17 DIAGNOSIS — R1310 Dysphagia, unspecified: Secondary | ICD-10-CM

## 2014-08-17 DIAGNOSIS — R1314 Dysphagia, pharyngoesophageal phase: Secondary | ICD-10-CM

## 2014-08-17 DIAGNOSIS — R0989 Other specified symptoms and signs involving the circulatory and respiratory systems: Secondary | ICD-10-CM

## 2014-08-17 DIAGNOSIS — R1319 Other dysphagia: Secondary | ICD-10-CM

## 2014-08-17 MED ORDER — SODIUM CHLORIDE 0.9 % IV SOLN: 500.0000 mL | INTRAVENOUS | Status: DC

## 2014-08-17 NOTE — Patient Instructions (Signed)
YOU HAD AN ENDOSCOPIC PROCEDURE TODAY AT THE [facility]: Refer to the procedure report that was given to you for any specific questions about what was found during the examination.  If the procedure report does not answer your questions, please call your gastroenterologist to clarify.  If you requested that your care partner not be given the details of your procedure findings, then the procedure report has been included in a sealed envelope for you to review at your convenience later.  YOU SHOULD EXPECT: Some feelings of bloating in the abdomen. Passage of more gas than usual.  Walking can help get rid of the air that was put into your GI tract during the procedure and reduce the bloating. If you had a lower endoscopy (such as a colonoscopy or flexible sigmoidoscopy) you may notice spotting of blood in your stool or on the toilet paper. If you underwent a bowel prep for your procedure, then you may not have a normal bowel movement for a few days.  DIET: Your first meal following the procedure should be a light meal and then it is ok to progress to your normal diet.  A half-sandwich or bowl of soup is an example of a good first meal.  Heavy or fried foods are harder to digest and may make you feel nauseous or bloated.  Likewise meals heavy in dairy and vegetables can cause extra gas to form and this can also increase the bloating.  Drink plenty of fluids but you should avoid alcoholic beverages for 24 hours.  ACTIVITY: Your care partner should take you home directly after the procedure.  You should plan to take it easy, moving slowly for the rest of the day.  You can resume normal activity the day after the procedure however you should NOT DRIVE or use heavy machinery for 24 hours (because of the sedation medicines used during the test).    SYMPTOMS TO REPORT IMMEDIATELY: A gastroenterologist can be reached at any hour.  During normal business hours, 8:30 AM to 5:00 PM Monday through Friday,  call [phone].  After hours and on weekends, please call the GI answering service at [phone] who will take a message and have the physician on call contact you.  Following upper endoscopy (EGD)  Vomiting of blood or coffee ground material  New chest pain or pain under the shoulder blades  Painful or persistently difficult swallowing  New shortness of breath  Fever of 100F or higher  Black, tarry-looking stools  FOLLOW UP: If any biopsies were taken you will be contacted by phone or by letter within the next 1-3 weeks.  Call your gastroenterologist if you have not heard about the biopsies in 3 weeks.  Our staff will call the home number listed on your records the next business day following your procedure to check on you and address any questions or concerns that you may have at that time regarding the information given to you following your procedure. This is a courtesy call and so if there is no answer at the home number and we have not heard from you through the emergency physician on call, we will assume that you have returned to your regular daily activities without incident.  SIGNATURES/CONFIDENTIALITY: You and/or your care partner have signed paperwork which will be entered into your electronic medical record.  These signatures attest to the fact that that the information above on your After Visit Summary has been reviewed and is understood.  Full responsibility of   the confidentiality of this discharge information lies with you and/or your care-partner. 

## 2014-08-17 NOTE — Op Note (Signed)
[place]  [address] [place], [address]   ENDOSCOPY PROCEDURE REPORT  PATIENT: Carmen, Hoffman  MR#: [id number] BIRTHDATE: 1971-03-29 , 43  yrs. old GENDER: female ENDOSCOPIST: Milus Banister, MD REFERRED BY:  Creig Hines, M.D. PROCEDURE DATE:  08/17/2014 PROCEDURE:  EGD, diagnostic ASA CLASS:     Class II INDICATIONS:  globus, ? GERD related. MEDICATIONS: Monitored anesthesia care and Propofol 150 mg IV TOPICAL ANESTHETIC: none  DESCRIPTION OF PROCEDURE: After the risks benefits and alternatives of the procedure were thoroughly explained, informed consent was obtained.  The LB [id number] endoscope was introduced through the mouth and advanced to the second portion of the duodenum , Without limitations.  The instrument was slowly withdrawn as the mucosa was fully examined.   EXAM: The esophagus and gastroesophageal junction were completely normal in appearance.  The stomach was entered and closely examined.The antrum, angularis, and lesser curvature were well visualized, including a retroflexed view of the cardia and fundus. The stomach wall was normally distensable.  The scope passed easily through the pylorus into the duodenum.  Retroflexed views revealed no abnormalities.     The scope was then withdrawn from the patient and the procedure completed.  COMPLICATIONS: There were no immediate complications.  ENDOSCOPIC IMPRESSION: Normal appearing esophagus and GE junction, the stomach was well visualized and normal in appearance, normal appearing duodenum  RECOMMENDATIONS: If you become bothered with swallowing difficulties again please try once daily OTC omeprazole (best taken 20-30 min before BF or dinner meal).  eSigned:  Milus Banister, MD 08/17/2014 2:44 PM

## 2014-08-20 ENCOUNTER — Telehealth: Payer: Self-pay

## 2014-08-20 NOTE — Telephone Encounter (Signed)
  Follow up Call-  Call back number 08/17/2014  Post procedure Call Back phone  # [phone]  Permission to leave phone message Yes     Patient questions:  Do you have a fever, pain , or abdominal swelling? No. Pain Score  0 *  Have you tolerated food without any problems? Yes.    Have you been able to return to your normal activities? Yes.    Do you have any questions about your discharge instructions: Diet   No. Medications  No. Follow up visit  No.  Do you have questions or concerns about your Care? No.  Actions: * If pain score is 4 or above: No action needed, pain <4.

## 2014-12-31 IMAGING — CR DG CERVICAL SPINE COMPLETE 4+V
5 series · 5 of 5 positions shown · non-contrast
Comparison: None.

CLINICAL DATA: Right-sided neck pain.

CERVICAL SPINE - COMPLETE 4+ VIEW

[view not recorded (1 of 5)]
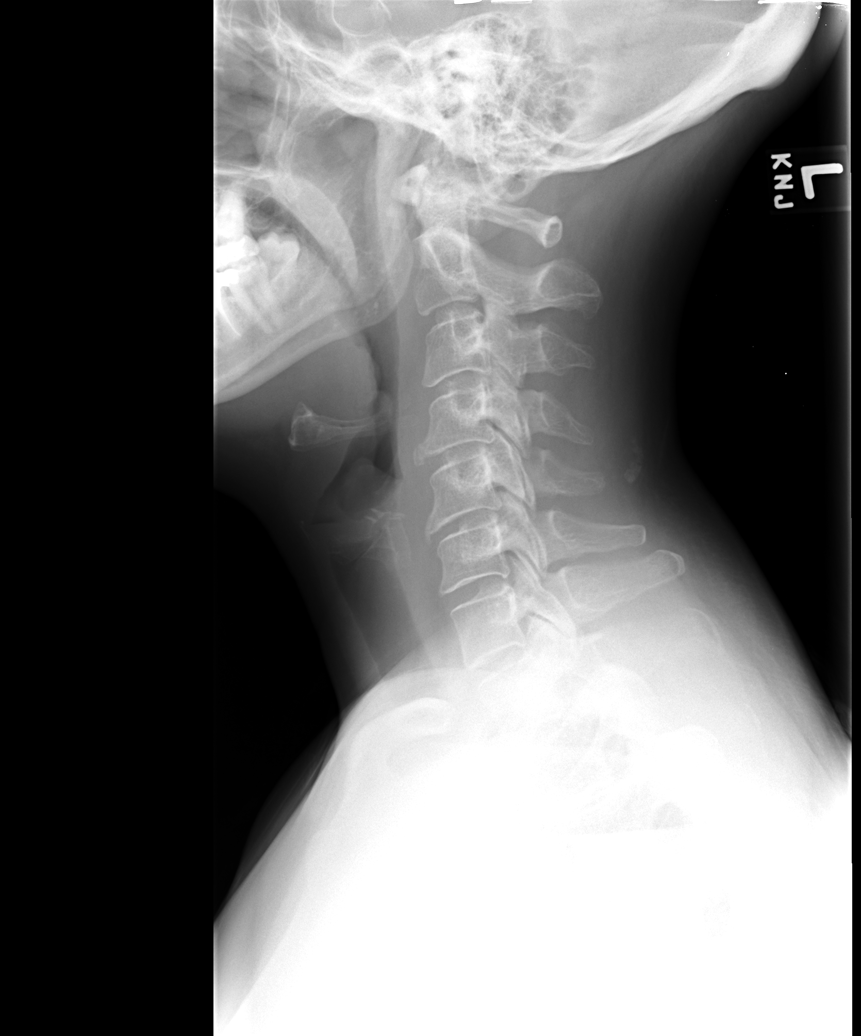

[view not recorded (2 of 5)]
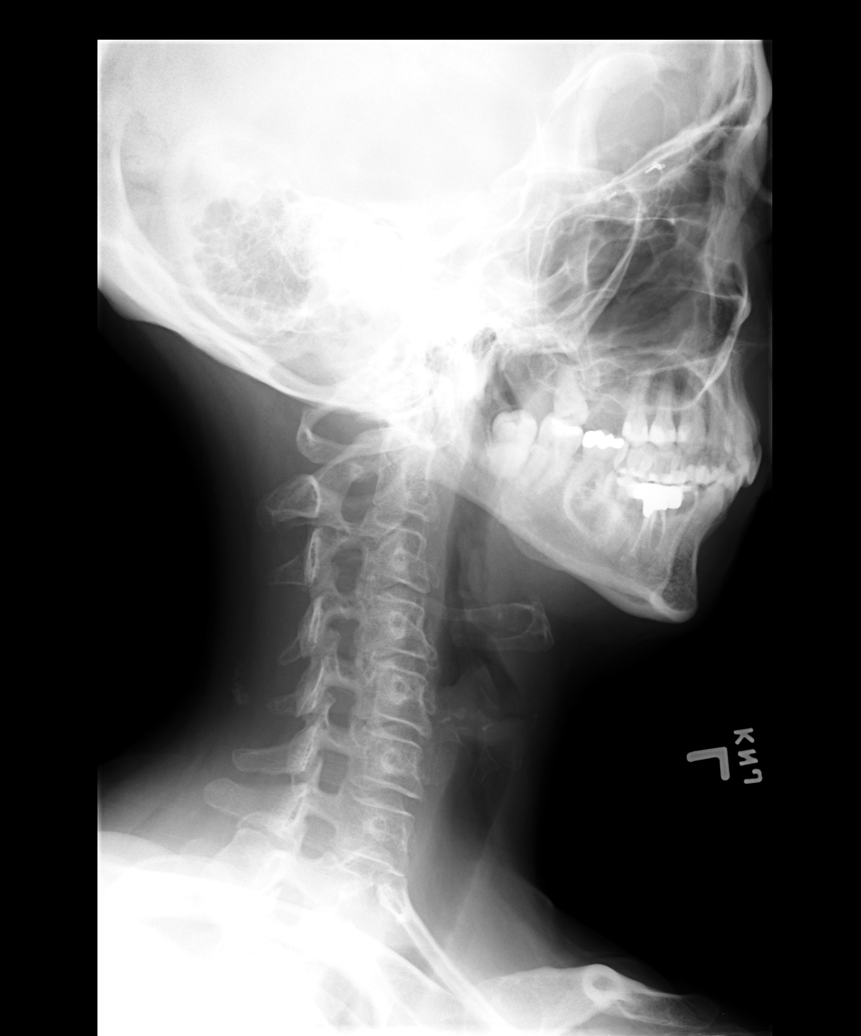

[view not recorded (3 of 5)]
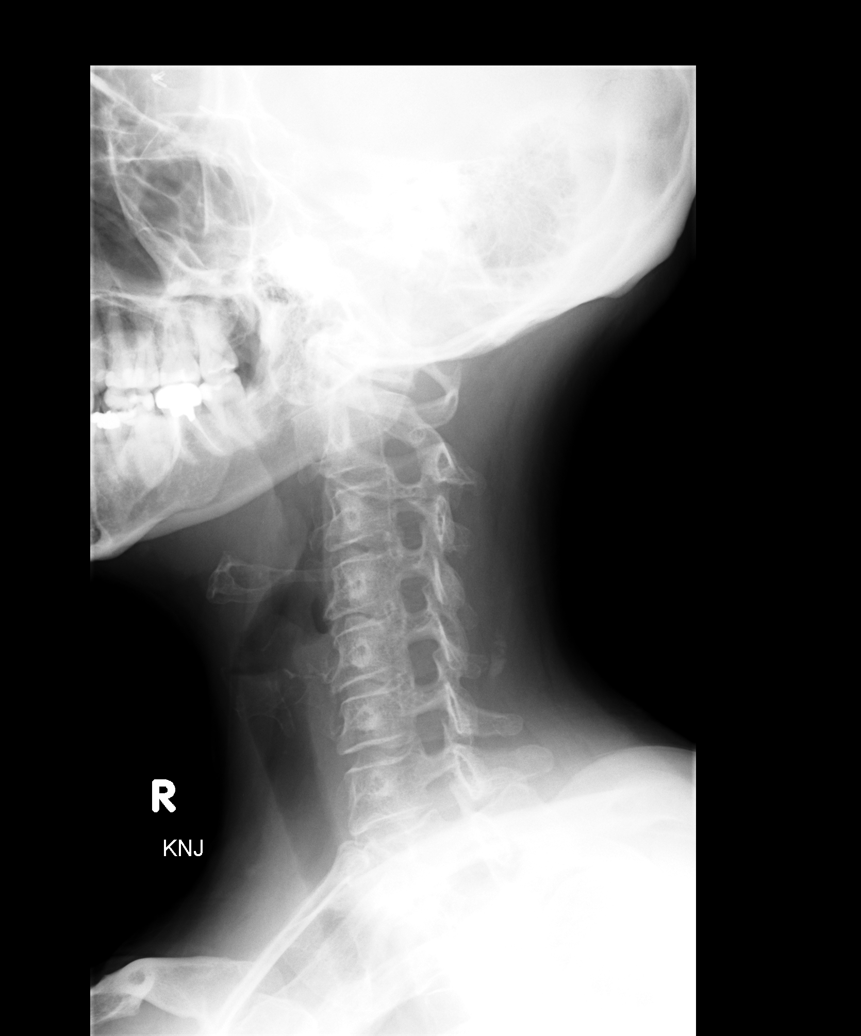

[view not recorded (4 of 5)]
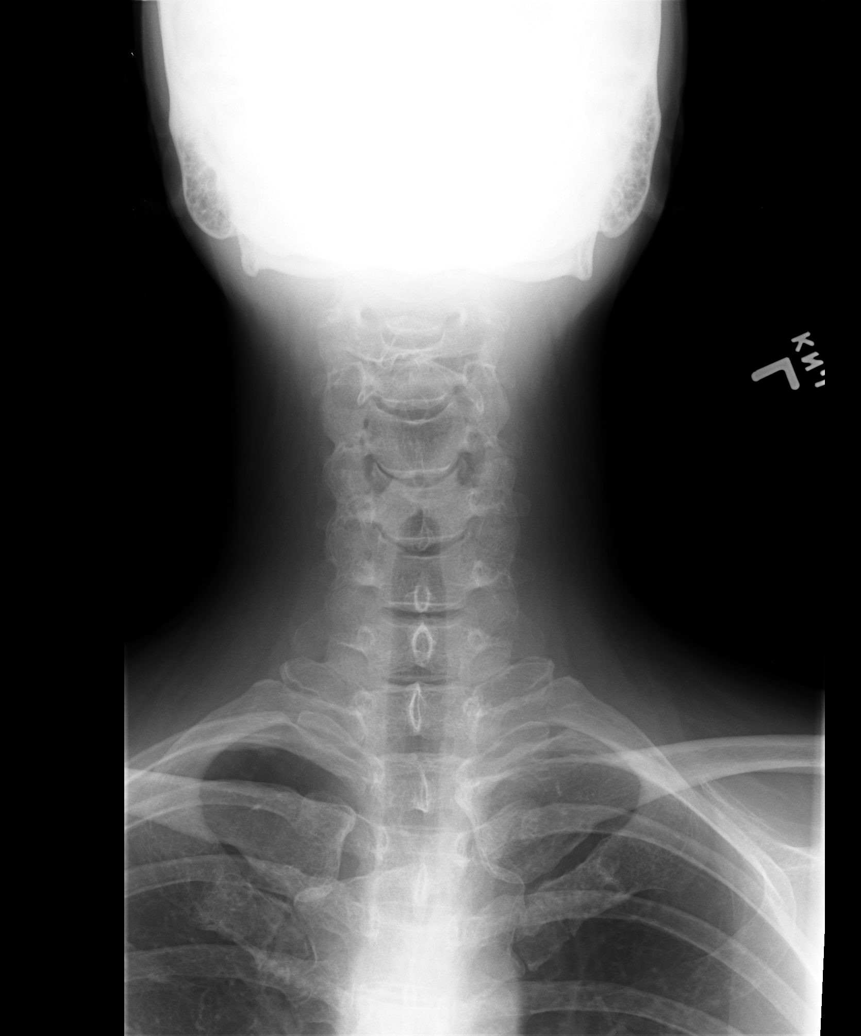

[view not recorded (5 of 5)]
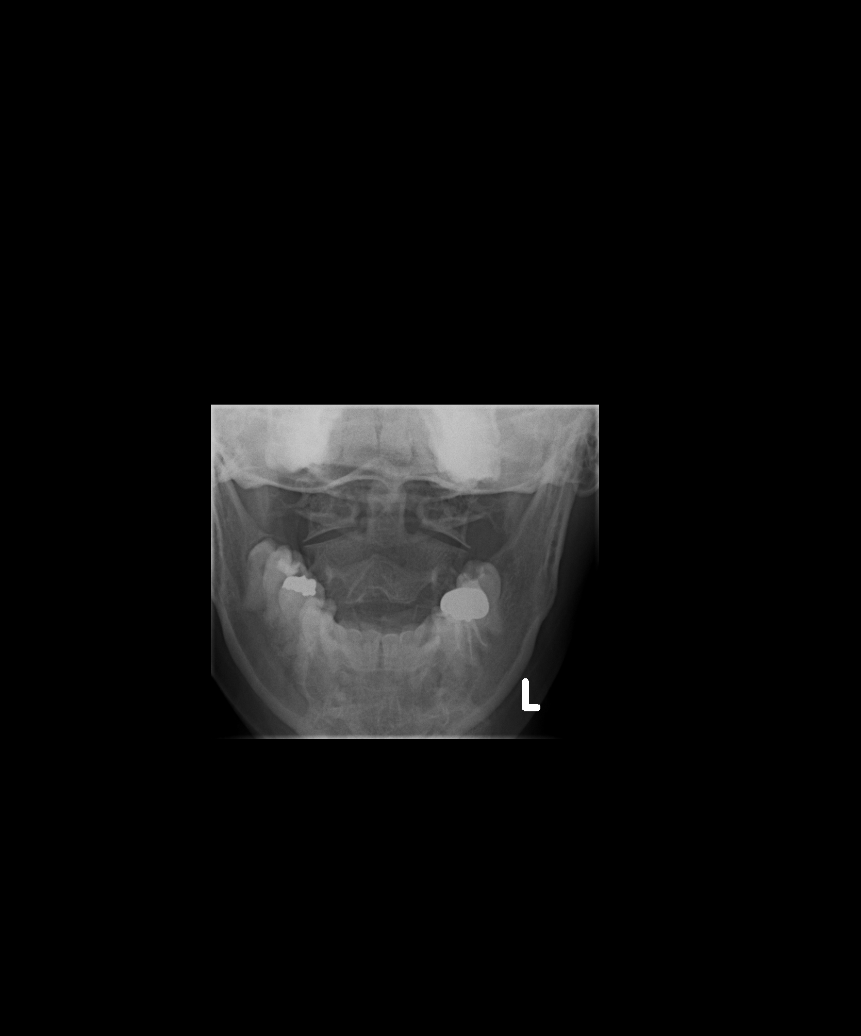

[5 of 5 positions shown; findings below may reference images not displayed]

FINDINGS: There is soft tissue calcification in the region of the
nuchal ligament at the C5 level.  This probably represents myositis
ossificans from a prior injury.

The patient has slight facet arthritis at C4-5 and C5-6 on the
right.  No foraminal stenosis.  No disc space narrowing.
Prevertebral soft tissues are normal.
IMPRESSION: Slight right facet arthritis at C4-5 and C5-6.

Calcification in the region of the nuchal ligament at C5 consistent
with myositis ossificans from prior injury.

## 2015-01-09 ENCOUNTER — Encounter (HOSPITAL_COMMUNITY): Payer: Self-pay | Admitting: *Deleted

## 2015-01-09 ENCOUNTER — Emergency Department (HOSPITAL_COMMUNITY)
Admission: EM | Admit: 2015-01-09 | Discharge: 2015-01-09 | Disposition: A | Discharge status: Home / Self Care | Payer: 59 | Source: Home / Self Care | Attending: Family Medicine | Admitting: Family Medicine

## 2015-01-09 DIAGNOSIS — B349 Viral infection, unspecified: Secondary | ICD-10-CM

## 2015-01-09 DIAGNOSIS — J069 Acute upper respiratory infection, unspecified: Secondary | ICD-10-CM

## 2015-01-09 MED ORDER — ONDANSETRON HCL 4 MG PO TABS: 4.0000 mg | ORAL_TABLET | Freq: Four times a day (QID) | ORAL | Status: DC

## 2015-01-09 MED ORDER — TRAMADOL HCL 50 MG PO TABS: 50.0000 mg | ORAL_TABLET | Freq: Four times a day (QID) | ORAL | Status: DC | PRN

## 2015-01-09 NOTE — ED Notes (Signed)
Pt     Reports   Symptoms  Of       Cough   Congested            Stuffy  Nose  Chills   And  Body  Aches            Onset  Of  Symptoms    X     4  Days          Pt  Is  Masked  And  In a  Private  Room

## 2015-01-09 NOTE — ED Provider Notes (Signed)
CSN: [id number]     Arrival date & time 01/09/15  0801 History   First MD Initiated Contact with Patient 01/09/15 [phone]     Chief Complaint  Patient presents with  . URI   (Consider location/radiation/quality/duration/timing/severity/associated sxs/prior Treatment) HPI Comments: 44 year old female with a three-day history of upper respiratory congestion, nasal congestion, PND watery eyes, body aches, cough and low-grade fever and then 99 range. She is taking no medications. She has a history of GERD, genital herpes, anxiety and depression. She works as a Charity fundraiser at [employer].   Past Medical History  Diagnosis Date  . GERD (gastroesophageal reflux disease)   . Genital herpes   . Aphthous stomatitis   . Anxiety   . Depression   . ANEMIA-IRON DEFICIENCY 05/30/2007   Past Surgical History  Procedure Laterality Date  . Blood vessel      repair 23 years ago   Family History  Problem Relation Age of Onset  . Fibromyalgia Mother   . Hypertension Father    History  Substance Use Topics  . Smoking status: Current Some Day Smoker -- 1.50 packs/day    Types: Cigarettes  . Smokeless tobacco: Never Used  . Alcohol Use: No   OB History    No data available     Review of Systems  Constitutional: Positive for activity change and appetite change. Negative for fever, chills and fatigue.  HENT: Positive for congestion, postnasal drip, rhinorrhea and sore throat. Negative for facial swelling.   Eyes: Negative.   Respiratory: Positive for cough.   Cardiovascular: Negative.   Gastrointestinal: Negative for abdominal pain.  Musculoskeletal: Negative for neck pain and neck stiffness.  Skin: Negative for pallor and rash.  Neurological: Negative.     Allergies  Diflucan  Home Medications   Prior to Admission medications   Medication Sig Start Date End Date Taking? Authorizing Provider  cyanocobalamin (,VITAMIN B-12,) 1000 MCG/ML injection Inject 1 mL (1,000 mcg total) into the  skin every 14 (fourteen) days. 06/06/14   Aleksei Plotnikov V, MD  NEEDLE, DISP, 25 G 25G X 1" MISC 1 each by Does not apply route See admin instructions. 03/19/14   Aleksei Plotnikov V, MD  ondansetron (ZOFRAN) 4 MG tablet Take 1 tablet (4 mg total) by mouth every 6 (six) hours. 01/09/15   Janne Napoleon, NP  predniSONE (DELTASONE) 10 MG tablet Prednisone 10 mg: take 4 tabs a day x 3 days; then 3 tabs a day x 4 days; then 2 tabs a day x 4 days, then 1 tab a day x 6 days, then stop. Take pc. 06/06/14   Cassandria Anger, MD  ranitidine (ZANTAC) 150 MG tablet Take 1 tablet (150 mg total) by mouth 2 (two) times daily. 03/19/14   Cassandria Anger, MD  Syringe, Disposable, 3 ML MISC 1 each by Does not apply route See admin instructions. 07/22/12   Aleksei Plotnikov V, MD  traMADol (ULTRAM) 50 MG tablet Take 1 tablet (50 mg total) by mouth every 6 (six) hours as needed. 01/09/15   Janne Napoleon, NP   BP 98/70 mmHg  Pulse 102  Temp(Src) 99.4 F (37.4 C) (Oral)  Resp 18  SpO2 99%  LMP 12/14/2014 Physical Exam  Constitutional: She is oriented to person, place, and time. She appears well-developed and well-nourished. No distress.  HENT:  Bilateral TMs are normal Oropharynx with minor erythema and cobblestoning with clear PND.  Eyes: Conjunctivae are normal.  Neck: Normal range of motion. Neck supple.  Cardiovascular:  Normal rate and regular rhythm.   Pulmonary/Chest: Effort normal and breath sounds normal. No respiratory distress. She has no wheezes. She has no rales.  Abdominal: Soft. She exhibits no distension. There is no tenderness. There is no rebound.  Musculoskeletal: Normal range of motion. She exhibits no edema.  Lymphadenopathy:    She has no cervical adenopathy.  Neurological: She is alert and oriented to person, place, and time.  Skin: Skin is warm and dry. No rash noted.  Psychiatric: She has a normal mood and affect.  Nursing note and vitals reviewed.   ED Course  Procedures (including  critical care time) Labs Review Labs Reviewed - No data to display  Imaging Review No results found.   MDM   1. Viral syndrome   2. URI (upper respiratory infection)     Recommend Nyquil, lots of liquids, mostly clear Saline nasal spray Motrin and Tramadol for aches and pain and cough.    Janne Napoleon, NP 01/09/15 [phone]

## 2015-01-09 NOTE — Discharge Instructions (Signed)
Upper Respiratory Infection, Adult Recommend Nyquil, lots of liquids, mostly clear Saline nasal spray Motrin and Tramadol for aches and pain and cough.  An upper respiratory infection (URI) is also sometimes known as the common cold. The upper respiratory tract includes the nose, sinuses, throat, trachea, and bronchi. Bronchi are the airways leading to the lungs. Most people improve within 1 week, but symptoms can last up to 2 weeks. A residual cough may last even longer.  CAUSES Many different viruses can infect the tissues lining the upper respiratory tract. The tissues become irritated and inflamed and often become very moist. Mucus production is also common. A cold is contagious. You can easily spread the virus to others by oral contact. This includes kissing, sharing a glass, coughing, or sneezing. Touching your mouth or nose and then touching a surface, which is then touched by another person, can also spread the virus. SYMPTOMS  Symptoms typically develop 1 to 3 days after you come in contact with a cold virus. Symptoms vary from person to person. They may include:  Runny nose.  Sneezing.  Nasal congestion.  Sinus irritation.  Sore throat.  Loss of voice (laryngitis).  Cough.  Fatigue.  Muscle aches.  Loss of appetite.  Headache.  Low-grade fever. DIAGNOSIS  You might diagnose your own cold based on familiar symptoms, since most people get a cold 2 to 3 times a year. Your caregiver can confirm this based on your exam. Most importantly, your caregiver can check that your symptoms are not due to another disease such as strep throat, sinusitis, pneumonia, asthma, or epiglottitis. Blood tests, throat tests, and X-rays are not necessary to diagnose a common cold, but they may sometimes be helpful in excluding other more serious diseases. Your caregiver will decide if any further tests are required. RISKS AND COMPLICATIONS  You may be at risk for a more severe case of the common  cold if you smoke cigarettes, have chronic heart disease (such as heart failure) or lung disease (such as asthma), or if you have a weakened immune system. The very young and very old are also at risk for more serious infections. Bacterial sinusitis, middle ear infections, and bacterial pneumonia can complicate the common cold. The common cold can worsen asthma and chronic obstructive pulmonary disease (COPD). Sometimes, these complications can require emergency medical care and may be life-threatening. PREVENTION  The best way to protect against getting a cold is to practice good hygiene. Avoid oral or hand contact with people with cold symptoms. Wash your hands often if contact occurs. There is no clear evidence that vitamin C, vitamin E, echinacea, or exercise reduces the chance of developing a cold. However, it is always recommended to get plenty of rest and practice good nutrition. TREATMENT  Treatment is directed at relieving symptoms. There is no cure. Antibiotics are not effective, because the infection is caused by a virus, not by bacteria. Treatment may include:  Increased fluid intake. Sports drinks offer valuable electrolytes, sugars, and fluids.  Breathing heated mist or steam (vaporizer or shower).  Eating chicken soup or other clear broths, and maintaining good nutrition.  Getting plenty of rest.  Using gargles or lozenges for comfort.  Controlling fevers with ibuprofen or acetaminophen as directed by your caregiver.  Increasing usage of your inhaler if you have asthma. Zinc gel and zinc lozenges, taken in the first 24 hours of the common cold, can shorten the duration and lessen the severity of symptoms. Pain medicines may help with fever,  muscle aches, and throat pain. A variety of non-prescription medicines are available to treat congestion and runny nose. Your caregiver can make recommendations and may suggest nasal or lung inhalers for other symptoms.  HOME CARE INSTRUCTIONS     Only take over-the-counter or prescription medicines for pain, discomfort, or fever as directed by your caregiver.  Use a warm mist humidifier or inhale steam from a shower to increase air moisture. This may keep secretions moist and make it easier to breathe.  Drink enough water and fluids to keep your urine clear or pale yellow.  Rest as needed.  Return to work when your temperature has returned to normal or as your caregiver advises. You may need to stay home longer to avoid infecting others. You can also use a face mask and careful hand washing to prevent spread of the virus. SEEK MEDICAL CARE IF:   After the first few days, you feel you are getting worse rather than better.  You need your caregiver's advice about medicines to control symptoms.  You develop chills, worsening shortness of breath, or brown or red sputum. These may be signs of pneumonia.  You develop yellow or brown nasal discharge or pain in the face, especially when you bend forward. These may be signs of sinusitis.  You develop a fever, swollen neck glands, pain with swallowing, or white areas in the back of your throat. These may be signs of strep throat. SEEK IMMEDIATE MEDICAL CARE IF:   You have a fever.  You develop severe or persistent headache, ear pain, sinus pain, or chest pain.  You develop wheezing, a prolonged cough, cough up blood, or have a change in your usual mucus (if you have chronic lung disease).  You develop sore muscles or a stiff neck. Document Released: 03/24/2001 Document Revised: 12/21/2011 Document Reviewed: 01/03/2014 [facility] Patient Information 2015 [place], [place]. This information is not intended to replace advice given to you by your health care provider. Make sure you discuss any questions you have with your health care provider.  Viral Infections A viral infection can be caused by different types of viruses.Most viral infections are not serious and resolve on their own.  However, some infections may cause severe symptoms and may lead to further complications. SYMPTOMS Viruses can frequently cause:  Minor sore throat.  Aches and pains.  Headaches.  Runny nose.  Different types of rashes.  Watery eyes.  Tiredness.  Cough.  Loss of appetite.  Gastrointestinal infections, resulting in nausea, vomiting, and diarrhea. These symptoms do not respond to antibiotics because the infection is not caused by bacteria. However, you might catch a bacterial infection following the viral infection. This is sometimes called a "superinfection." Symptoms of such a bacterial infection may include:  Worsening sore throat with pus and difficulty swallowing.  Swollen neck glands.  Chills and a high or persistent fever.  Severe headache.  Tenderness over the sinuses.  Persistent overall ill feeling (malaise), muscle aches, and tiredness (fatigue).  Persistent cough.  Yellow, green, or brown mucus production with coughing. HOME CARE INSTRUCTIONS   Only take over-the-counter or prescription medicines for pain, discomfort, diarrhea, or fever as directed by your caregiver.  Drink enough water and fluids to keep your urine clear or pale yellow. Sports drinks can provide valuable electrolytes, sugars, and hydration.  Get plenty of rest and maintain proper nutrition. Soups and broths with crackers or rice are fine. SEEK IMMEDIATE MEDICAL CARE IF:   You have severe headaches, shortness of breath, chest  pain, neck pain, or an unusual rash.  You have uncontrolled vomiting, diarrhea, or you are unable to keep down fluids.  You or your child has an oral temperature above 102 F (38.9 C), not controlled by medicine.  Your baby is older than 3 months with a rectal temperature of 102 F (38.9 C) or higher.  Your baby is 66 months old or younger with a rectal temperature of 100.4 F (38 C) or higher. MAKE SURE YOU:   Understand these instructions.  Will watch  your condition.  Will get help right away if you are not doing well or get worse. Document Released: 07/08/2005 Document Revised: 12/21/2011 Document Reviewed: 02/02/2011 [facility] Patient Information 2015 [place], [place]. This information is not intended to replace advice given to you by your health care provider. Make sure you discuss any questions you have with your health care provider.

## 2015-04-03 ENCOUNTER — Ambulatory Visit (INDEPENDENT_AMBULATORY_CARE_PROVIDER_SITE_OTHER): Payer: 59 | Admitting: Internal Medicine

## 2015-04-03 ENCOUNTER — Encounter: Payer: Self-pay | Admitting: Internal Medicine

## 2015-04-03 VITALS — BP 98/56 | HR 71 | Wt 121.0 lb

## 2015-04-03 DIAGNOSIS — M542 Cervicalgia: Secondary | ICD-10-CM | POA: Diagnosis not present

## 2015-04-03 DIAGNOSIS — E538 Deficiency of other specified B group vitamins: Secondary | ICD-10-CM | POA: Diagnosis not present

## 2015-04-03 MED ORDER — TRAMADOL HCL 50 MG PO TABS: 50.0000 mg | ORAL_TABLET | Freq: Two times a day (BID) | ORAL | Status: DC

## 2015-04-03 MED ORDER — VITAMIN D 1000 UNITS PO TABS: 1000.0000 [IU] | ORAL_TABLET | Freq: Every day | ORAL | Status: AC

## 2015-04-03 NOTE — Assessment & Plan Note (Signed)
On B12 inj 

## 2015-04-03 NOTE — Assessment & Plan Note (Signed)
Will use Tramadol UDS - tight control RTC 1 mo

## 2015-04-03 NOTE — Progress Notes (Signed)
Pre visit review using our clinic review tool, if applicable. No additional management support is needed unless otherwise documented below in the visit note. 

## 2015-04-03 NOTE — Progress Notes (Signed)
  Subjective:     Neck Pain  This is a chronic problem. The current episode started more than 1 year ago. The problem occurs constantly. The problem has been gradually worsening. The pain is present in the right side. The quality of the pain is described as aching and burning. The pain is at a severity of 8/10. The pain is severe. The symptoms are aggravated by position. Pertinent negatives include no trouble swallowing. She has tried acetaminophen and NSAIDs for the symptoms. The treatment provided mild relief.   C/o ST - chronic - s/p ENT eval: cont to smoke 1 PPD F/u GERD, B12 def   Review of Systems  Constitutional: Negative for chills and fatigue.  HENT: Positive for congestion. Negative for mouth sores, trouble swallowing and voice change.   Respiratory: Negative for chest tightness.   Gastrointestinal: Negative for abdominal distention.  Genitourinary: Negative for flank pain.  Musculoskeletal: Negative for myalgias and neck stiffness.  Neurological: Negative for tremors.  Psychiatric/Behavioral: The patient is not nervous/anxious.        Objective:   Physical Exam  Constitutional: No distress.  NAD  Cardiovascular: Normal rate.  Exam reveals no gallop.   Pulmonary/Chest: She has no wheezes. She has no rales.  Abdominal: There is no tenderness. There is no guarding.  Musculoskeletal: She exhibits tenderness. She exhibits no edema.  R neck, R trap  tender  Neurological: She displays normal reflexes. No cranial nerve deficit. She exhibits normal muscle tone. Coordination normal.  Skin: No rash noted. No erythema.  Psychiatric: Judgment normal.           Assessment & Plan:

## 2015-04-22 ENCOUNTER — Encounter: Payer: Self-pay | Admitting: Internal Medicine

## 2015-06-12 ENCOUNTER — Other Ambulatory Visit: Payer: Self-pay | Admitting: Internal Medicine

## 2015-08-07 ENCOUNTER — Other Ambulatory Visit: Payer: Self-pay

## 2015-08-07 DIAGNOSIS — Z1231 Encounter for screening mammogram for malignant neoplasm of breast: Secondary | ICD-10-CM

## 2015-08-30 ENCOUNTER — Ambulatory Visit
Admission: RE | Admit: 2015-08-30 | Discharge: 2015-08-30 | Disposition: A | Discharge status: Home / Self Care | Payer: 59 | Source: Ambulatory Visit

## 2015-08-30 DIAGNOSIS — Z1231 Encounter for screening mammogram for malignant neoplasm of breast: Secondary | ICD-10-CM

## 2015-11-27 MED FILL — CYANOCOBALAMIN 1,000 MCG/ML: 1000 | 84 days supply | Qty: 6 | Fill #1

## 2016-01-10 DIAGNOSIS — M79602 Pain in left arm: Secondary | ICD-10-CM | POA: Diagnosis not present

## 2016-01-10 DIAGNOSIS — I809 Phlebitis and thrombophlebitis of unspecified site: Secondary | ICD-10-CM | POA: Diagnosis not present

## 2016-01-10 DIAGNOSIS — R0789 Other chest pain: Secondary | ICD-10-CM | POA: Diagnosis not present

## 2016-02-03 DIAGNOSIS — L720 Epidermal cyst: Secondary | ICD-10-CM | POA: Diagnosis not present

## 2016-02-14 DIAGNOSIS — L720 Epidermal cyst: Secondary | ICD-10-CM | POA: Diagnosis not present

## 2016-03-25 DIAGNOSIS — Z6822 Body mass index (BMI) 22.0-22.9, adult: Secondary | ICD-10-CM | POA: Diagnosis not present

## 2016-03-25 DIAGNOSIS — N898 Other specified noninflammatory disorders of vagina: Secondary | ICD-10-CM | POA: Diagnosis not present

## 2016-03-25 DIAGNOSIS — Z01419 Encounter for gynecological examination (general) (routine) without abnormal findings: Secondary | ICD-10-CM | POA: Diagnosis not present

## 2016-03-25 DIAGNOSIS — Z130 Encounter for screening for diseases of the blood and blood-forming organs and certain disorders involving the immune mechanism: Secondary | ICD-10-CM | POA: Diagnosis not present

## 2016-03-25 DIAGNOSIS — A609 Anogenital herpesviral infection, unspecified: Secondary | ICD-10-CM | POA: Diagnosis not present

## 2016-03-25 DIAGNOSIS — Z1389 Encounter for screening for other disorder: Secondary | ICD-10-CM | POA: Diagnosis not present

## 2016-05-19 MED FILL — CYANOCOBALAMIN 1,000 MCG/ML: 1000 | 84 days supply | Qty: 6 | Fill #2

## 2016-06-17 ENCOUNTER — Encounter: Payer: Self-pay | Admitting: Physician Assistant

## 2016-06-17 ENCOUNTER — Ambulatory Visit (INDEPENDENT_AMBULATORY_CARE_PROVIDER_SITE_OTHER): Payer: 59 | Admitting: Physician Assistant

## 2016-06-17 ENCOUNTER — Ambulatory Visit (HOSPITAL_COMMUNITY)
Admission: RE | Admit: 2016-06-17 | Discharge: 2016-06-17 | Disposition: A | Discharge status: Home / Self Care | Payer: 59 | Source: Ambulatory Visit | Attending: Physician Assistant | Admitting: Physician Assistant

## 2016-06-17 VITALS — BP 116/64 | HR 86 | Temp 97.7°F | Resp 14 | Ht 64.0 in | Wt 125.0 lb

## 2016-06-17 DIAGNOSIS — Z13220 Encounter for screening for lipoid disorders: Secondary | ICD-10-CM | POA: Diagnosis not present

## 2016-06-17 DIAGNOSIS — R5383 Other fatigue: Secondary | ICD-10-CM

## 2016-06-17 DIAGNOSIS — M25562 Pain in left knee: Secondary | ICD-10-CM

## 2016-06-17 DIAGNOSIS — E538 Deficiency of other specified B group vitamins: Secondary | ICD-10-CM

## 2016-06-17 DIAGNOSIS — Z79899 Other long term (current) drug therapy: Secondary | ICD-10-CM

## 2016-06-17 DIAGNOSIS — Z0000 Encounter for general adult medical examination without abnormal findings: Secondary | ICD-10-CM

## 2016-06-17 DIAGNOSIS — R05 Cough: Secondary | ICD-10-CM | POA: Insufficient documentation

## 2016-06-17 DIAGNOSIS — R6889 Other general symptoms and signs: Secondary | ICD-10-CM

## 2016-06-17 DIAGNOSIS — M542 Cervicalgia: Secondary | ICD-10-CM

## 2016-06-17 DIAGNOSIS — Z87891 Personal history of nicotine dependence: Secondary | ICD-10-CM | POA: Insufficient documentation

## 2016-06-17 DIAGNOSIS — I700 Atherosclerosis of aorta: Secondary | ICD-10-CM | POA: Insufficient documentation

## 2016-06-17 DIAGNOSIS — M50321 Other cervical disc degeneration at C4-C5 level: Secondary | ICD-10-CM | POA: Diagnosis not present

## 2016-06-17 DIAGNOSIS — Z131 Encounter for screening for diabetes mellitus: Secondary | ICD-10-CM

## 2016-06-17 DIAGNOSIS — Z0001 Encounter for general adult medical examination with abnormal findings: Secondary | ICD-10-CM

## 2016-06-17 DIAGNOSIS — E559 Vitamin D deficiency, unspecified: Secondary | ICD-10-CM | POA: Diagnosis not present

## 2016-06-17 DIAGNOSIS — Z1389 Encounter for screening for other disorder: Secondary | ICD-10-CM

## 2016-06-17 DIAGNOSIS — Z139 Encounter for screening, unspecified: Secondary | ICD-10-CM | POA: Diagnosis not present

## 2016-06-17 LAB — CBC WITH DIFFERENTIAL/PLATELET
BASOS ABS: 0 {cells}/uL (ref 0–200)
BASOS PCT: 0 %
EOS ABS: 78 {cells}/uL (ref 15–500)
Eosinophils Relative: 1 %
HEMATOCRIT: 44.9 % (ref 35.0–45.0)
Hemoglobin: 14.8 g/dL (ref 11.7–15.5)
LYMPHS PCT: 25 %
Lymphs Abs: 1950 cells/uL (ref 850–3900)
MCH: 29.7 pg (ref 27.0–33.0)
MCHC: 33 g/dL (ref 32.0–36.0)
MCV: 90 fL (ref 80.0–100.0)
MONO ABS: 390 {cells}/uL (ref 200–950)
MPV: 11 fL (ref 7.5–12.5)
Monocytes Relative: 5 %
NEUTROS PCT: 69 %
Neutro Abs: 5382 cells/uL (ref 1500–7800)
Platelets: 191 10*3/uL (ref 140–400)
RBC: 4.99 MIL/uL (ref 3.80–5.10)
RDW: 12.9 % (ref 11.0–15.0)
WBC: 7.8 10*3/uL (ref 3.8–10.8)

## 2016-06-17 LAB — HEPATIC FUNCTION PANEL
ALBUMIN: 3.8 g/dL (ref 3.6–5.1)
ALK PHOS: 31 U/L — AB (ref 33–115)
ALT: 9 U/L (ref 6–29)
AST: 13 U/L (ref 10–30)
Bilirubin, Direct: 0.1 mg/dL (ref <=0.2)
Indirect Bilirubin: 0.1 mg/dL — ABNORMAL LOW (ref 0.2–1.2)
TOTAL PROTEIN: 5.8 g/dL — AB (ref 6.1–8.1)
Total Bilirubin: 0.2 mg/dL (ref 0.2–1.2)

## 2016-06-17 LAB — BASIC METABOLIC PANEL WITH GFR
BUN: 18 mg/dL (ref 7–25)
CO2: 27 mmol/L (ref 20–31)
CREATININE: 0.61 mg/dL (ref 0.50–1.10)
Calcium: 8.7 mg/dL (ref 8.6–10.2)
Chloride: 104 mmol/L (ref 98–110)
GFR, Est Non African American: >89 mL/min (ref >=60)
GLUCOSE: 91 mg/dL (ref 65–99)
Potassium: 4.5 mmol/L (ref 3.5–5.3)
Sodium: 137 mmol/L (ref 135–146)

## 2016-06-17 LAB — LIPID PANEL
Cholesterol: 166 mg/dL (ref 125–200)
HDL: 51 mg/dL (ref >=46)
LDL CALC: 94 mg/dL (ref <130)
TRIGLYCERIDES: 107 mg/dL (ref <150)
Total CHOL/HDL Ratio: 3.3 Ratio (ref <=5.0)
VLDL: 21 mg/dL (ref <30)

## 2016-06-17 LAB — IRON AND TIBC
%SAT: 18 % (ref 11–50)
IRON: 53 ug/dL (ref 40–190)
TIBC: 295 ug/dL (ref 250–450)
UIBC: 242 ug/dL (ref 125–400)

## 2016-06-17 LAB — MAGNESIUM: Magnesium: 1.9 mg/dL (ref 1.5–2.5)

## 2016-06-17 MED ORDER — PREDNISONE 20 MG PO TABS: ORAL_TABLET | ORAL | 0 refills | Status: AC

## 2016-06-17 MED ORDER — CYCLOBENZAPRINE HCL 10 MG PO TABS
ORAL_TABLET | ORAL | 0 refills | Status: DC
Start: 2016-06-17 — End: 2018-05-31

## 2016-06-17 MED ORDER — TRAMADOL HCL 50 MG PO TABS: ORAL_TABLET | ORAL | 0 refills | Status: DC

## 2016-06-17 NOTE — Progress Notes (Signed)
Complete Physical  Assessment and Plan:  1. B12 deficiency - Vitamin B12  2. Neck pain on right side ? DDD versus TMJ- no radicular symptoms Heat, PT for TMJ/DDD, flexeril at night x 2 weeks, prednisone taper, and tramadol PR - DG Cervical Spine Complete; Future - traMADol (ULTRAM) 50 MG tablet; 1 pill twice daily as needed for pain  Dispense: 60 tablet; Refill: 0 - predniSONE (DELTASONE) 20 MG tablet; 1 pill 3 x a day for 3 days, 1 pill 2 x a day x 3 days, 1 pill a day x 5 days with food  Dispense: 20 tablet; Refill: 0 - cyclobenzaprine (FLEXERIL) 10 MG tablet; 1 at night for 2 weeks then as needed for neck pain  Dispense: 30 tablet; Refill: 0 - Ambulatory referral to Physical Therapy - Ambulatory referral to Orthopedic Surgery  3. Vitamin D deficiency - VITAMIN D 25 Hydroxy (Vit-D Deficiency, Fractures)  4. Screening cholesterol level - Lipid panel  5. Screening for blood or protein in urine - Urinalysis, Routine w reflex microscopic (not at [facility]) - Microalbumin / creatinine urine ratio  6. Screening for diabetes mellitus - Hemoglobin A1c  7. Other fatigue Check labs, EKG, etc.  - TSH - Iron and TIBC - Ferritin - EKG 12-Lead - DG Chest 2 View; Future  8. Medication management - CBC with Differential/Platelet - BASIC METABOLIC PANEL WITH GFR - Hepatic function panel - Magnesium  9. Routine general medical examination at a health care facility - CBC with Differential/Platelet - BASIC METABOLIC PANEL WITH GFR - Hepatic function panel - TSH - Lipid panel - Hemoglobin A1c - Magnesium - VITAMIN D 25 Hydroxy (Vit-D Deficiency, Fractures) - Urinalysis, Routine w reflex microscopic (not at [facility]) - Microalbumin / creatinine urine ratio - Iron and TIBC - Ferritin - Vitamin B12 - EKG 12-Lead - DG Chest 2 View; Future  10. 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 Screening labs and tests as requested with regular follow-up as recommended. Over 40 minutes of exam, counseling, chart review and critical decision making was performed  HPI  This very nice 45 y.o.female presents for complete physical as new patient, she is Carmen Hoffman.  She does not workout.  Finally, patient has history of Vitamin D Deficiency and last vitamin D was  Lab Results  Component Value Date   VD25OH 23 (L) 10/17/2012  Currently on supplementation. Right handed female. She has history of cervical neck pain, states getting worse on right side. 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 She takes 800mg  ibuprofen 1-2 x a day, has tramadol on it two times a day, tries not to take it twice a day.  Has left knee pain around knee cap x years, will occ pop/give out on her. No injury.  Had cervical neck Xray 03/2013 showed.  Slight right facet arthritis at C4-5 and C5-6.  On B12 injections, last infection last week.   Lab Results  Component Value Date   [id number] 10/17/2012    Current Medications:  Current Outpatient Prescriptions on File Prior to Visit  Medication Sig Dispense Refill  .  cyanocobalamin (,VITAMIN B-12,) 1000 MCG/ML injection INJECT 1 ML INTO THE SKIN EVERY 14 DAYS. 10 mL 6  . traMADol (ULTRAM) 50 MG tablet Take 1 tablet (50 mg total) by mouth 2 (two) times daily. 60 tablet 0  . [DISCONTINUED] gabapentin (NEURONTIN) 100 MG capsule Take 1-2 capsules (100-200 mg total) by mouth 3 (three) times daily as needed. 60 capsule 0   Current Facility-Administered Medications on File Prior to Visit  Medication Dose Route Frequency Provider Last Rate Last Dose  . methylPREDNISolone  acetate (DEPO-MEDROL) injection 20 mg  20 mg Intra-Lesional Once Cassandria Anger, MD      . methylPREDNISolone acetate (DEPO-MEDROL) injection 80 mg  80 mg Intramuscular Once Cassandria Anger, MD       Health Maintenance:   Immunization History  Administered Date(s) Administered  . Influenza Split 07/18/2012  . Influenza-Unspecified 07/11/2013   TD/TDAP: Needs to get next OV Influenza: gets at work Pneumovax: N/A Prevnar 13: N/A  LMP: Patient's last menstrual period was 05/21/2016. Pap:  Has GYNJoycelyn Rua MGM: 08/2015  Xray cervial 03/2013 Colonoscopy EGD 2015  Allergies:  Allergies  Allergen Reactions  . Diflucan [Fluconazole]     Rash Lip swelling   Medical History:  has ANEMIA-IRON DEFICIENCY; ANXIETY; DEPRESSION; SINUSITIS, ACUTE; LOW BACK PAIN; Throat pain; Acute conjunctivitis of left eye; Myalgia; GERD (gastroesophageal reflux disease); Genital herpes; Aphthous stomatitis; Urgency of urination; Boil of buttock; B12 deficiency; Vitamin D deficiency; Occipital neuralgia; Neck pain on right side; Headache, tension-type; Cellulitis and abscess of buttock; and Tobacco abuse disorder on her problem list. Surgical History:  She  has a past surgical history that includes blood vessel. Family History:  Her family history includes Fibromyalgia in her mother; Hypertension in her father. Social History:   reports that she quit smoking about 3 weeks ago. Her smoking use included Cigarettes. She smoked 1.50 packs per day. She has never used smokeless tobacco. She reports that she does not drink alcohol or use drugs.  Review of Systems: Review of Systems  Constitutional: Negative for chills, diaphoresis, fever, malaise/fatigue and weight loss.  HENT: Positive for congestion, hearing loss and tinnitus. Negative for ear discharge, ear pain, nosebleeds and sore throat.   Eyes: Negative.  Negative for blurred vision and double vision.  Respiratory: Negative.  Negative for  stridor.   Cardiovascular: Negative.  Negative for chest pain.  Gastrointestinal: Negative.   Genitourinary: Negative.   Musculoskeletal: Positive for neck pain. Negative for back pain, falls, joint pain and myalgias.  Skin: Negative.   Neurological: Negative.  Negative for weakness and headaches.  Endo/Heme/Allergies: Negative.   Psychiatric/Behavioral: Negative.     Physical Exam: Estimated body mass index is 21.46 kg/m as calculated from the following:   Height as of this encounter: 5\' 4"  (1.626 m).   Weight as of this encounter: 125 lb (56.7 kg). BP 116/64   Pulse 86   Temp 97.7 F (36.5 C)   Resp 14   Ht 5\' 4"  (1.626 m)   Wt 125 lb (56.7 kg)   LMP 05/21/2016   SpO2 98%   BMI 21.46 kg/m  General Appearance: Well nourished, in no apparent distress.  Eyes: PERRLA, EOMs, conjunctiva no swelling or erythema, normal fundi and vessels.  Sinuses: No Frontal/maxillary tenderness  ENT/Mouth: Ext aud canals clear, normal light reflex with TMs without erythema, bulging but + bilateral effusions, + TMJ right worse than left. Good dentition without abscesses. No erythema, swelling, or exudate on post pharynx. Tonsils not swollen  or erythematous. Hearing normal.  Neck: Supple, thyroid normal. No bruits  Respiratory: Respiratory effort normal, BS equal bilaterally without rales, rhonchi, wheezing or stridor.  Cardio: RRR without murmurs, rubs or gallops. Brisk peripheral pulses without edema.  Chest: symmetric, with normal excursions and percussion.  Breasts:defer  Abdomen: Soft, nontender, no guarding, rebound, hernias, masses, or organomegaly.  Lymphatics: Non tender without lymphadenopathy.  Genitourinary: defer Musculoskeletal: Full ROM all peripheral extremities,5/5 strength, and normal gait. Very tender right SCM/traps, no radicular symptoms.  Skin: Warm, dry without rashes, lesions, ecchymosis. Neuro: Cranial nerves intact, reflexes equal bilaterally. Normal muscle tone, no  cerebellar symptoms. Sensation intact.  Psych: Awake and oriented X 3, normal affect, Insight and Judgment appropriate.   EKG: WNL, no ST changes  Vicie Mutters 2:43 PM [facility]

## 2016-06-17 NOTE — Patient Instructions (Addendum)
Get on allergy pill Get on prednisone, do not take aleve/iburpofren with it  Go get chest xray and neck xray  our ears and sinuses are connected by the eustachian tube. When your sinuses are inflamed, this can close off the tube and cause fluid to collect in your middle ear. This can then cause dizziness, popping, clicking, ringing, and echoing in your ears. This is often NOT an infection and does NOT require antibiotics, it is caused by inflammation so the treatments help the inflammation. This can take a long time to get better so please be patient.  Here are things you can do to help with this: - Try the Flonase or Nasonex. Remember to spray each nostril twice towards the outer part of your eye.  Do not sniff but instead pinch your nose and tilt your head back to help the medicine get into your sinuses.  The best time to do this is at bedtime.Stop if you get blurred vision or nose bleeds.  -While drinking fluids, pinch and hold nose close and swallow, to help open eustachian tubes to drain fluid behind ear drums. -Please pick one of the over the counter allergy medications below and take it once daily for allergies.  It will also help with fluid behind ear drums. Claritin or loratadine cheapest but likely the weakest  Zyrtec or certizine at night because it can make you sleepy The strongest is allegra or fexafinadine  Cheapest at [employer], [employer], [employer] -can use decongestant over the counter, please do not use if you have high blood pressure or certain heart conditions.   if worsening HA, changes vision/speech, imbalance, weakness go to the ER   What is the TMJ? The temporomandibular (tem-PUH-ro-man-DIB-yoo-ler) joint, or the TMJ, connects the upper and lower jawbones. This joint allows the jaw to open wide and move back and forth when you chew, talk, or yawn.There are also several muscles that help this joint move. There can be muscle tightness and pain in the muscle that can cause several  symptoms.  What causes TMJ pain? There are many causes of TMJ pain. Repeated chewing (for example, chewing gum) and clenching your teeth can cause pain in the joint. Some TMJ pain has no obvious cause. What can I do to ease the pain? There are many things you can do to help your pain get better. When you have pain:  Eat soft foods and stay away from chewy foods (for example, taffy) Try to use both sides of your mouth to chew Don't chew gum Massage Don't open your mouth wide (for example, during yawning or singing) Don't bite your cheeks or fingernails Lower your amount of stress and worry Applying a warm, damp washcloth to the joint may help. Over-the-counter pain medicines such as ibuprofen (one brand: Advil) or acetaminophen (one brand: Tylenol) might also help. Do not use these medicines if you are allergic to them or if your doctor told you not to use them. How can I stop the pain from coming back? When your pain is better, you can do these exercises to make your muscles stronger and to keep the pain from coming back:  Resisted mouth opening: Place your thumb or two fingers under your chin and open your mouth slowly, pushing up lightly on your chin with your thumb. Hold for three to six seconds. Close your mouth slowly. Resisted mouth closing: Place your thumbs under your chin and your two index fingers on the ridge between your mouth and the bottom of  your chin. Push down lightly on your chin as you close your mouth. Tongue up: Slowly open and close your mouth while keeping the tongue touching the roof of the mouth. Side-to-side jaw movement: Place an object about one fourth of an inch thick (for example, two tongue depressors) between your front teeth. Slowly move your jaw from side to side. Increase the thickness of the object as the exercise becomes easier Forward jaw movement: Place an object about one fourth of an inch thick between your front teeth and move the bottom jaw forward so  that the bottom teeth are in front of the top teeth. Increase the thickness of the object as the exercise becomes easier. These exercises should not be painful. If it hurts to do these exercises, stop doing them and talk to your family doctor.    Patellofemoral Pain Syndrome Patellofemoral pain syndrome is a condition that involves a softening or breakdown of the tissue (cartilage) on the underside of your kneecap (patella). This causes pain in the front of the knee. The condition is also called runner's knee or chondromalacia patella. Patellofemoral pain syndrome is most common in young adults who are active in sports. Your knee is the largest joint in your body. The patella covers the front of your knee and is attached to muscles above and below your knee. The underside of the patella is covered with a smooth type of cartilage (synovium). The smooth surface helps the patella glide easily when you move your knee. Patellofemoral pain syndrome causes swelling in the joint linings and bone surfaces in your knee.  CAUSES  Patellofemoral pain syndrome can be caused by:  Overuse.  Poor alignment of your knee joints.  Weak leg muscles.  A direct blow to your kneecap. RISK FACTORS You may be at risk for patellofemoral pain syndrome if you:  Do a lot of activities that can wear down your kneecap. These include:  Running.  Squatting.  Climbing stairs.  Start a new physical activity or exercise program.  Wear shoes that do not fit well.  Do not have good leg strength.  Are overweight. SIGNS AND SYMPTOMS  Knee pain is the most common symptom of patellofemoral pain syndrome. This may feel like a dull, aching pain underneath your patella, in the front of your knee. There may be a popping or cracking sound when you move your knee. Pain may get worse with:  Exercise.  Climbing stairs.  Running.  Jumping.  Squatting.  Kneeling.  Sitting for a long time.  Moving or pushing on your  patella. DIAGNOSIS  Your health care provider may be able to diagnose patellofemoral pain syndrome from your symptoms and medical history. You may be asked about your recent physical activities and which ones cause knee pain. Your health care provider may do a physical exam with certain tests to confirm the diagnosis. These may include:  Moving your patella back and forth.  Checking your range of knee motion.  Having you squat or jump to see if you have pain.  Checking the strength of your leg muscles. An MRI of the knee may also be done. TREATMENT  Patellofemoral pain syndrome can usually be treated at home with rest, ice, compression, and elevation (RICE). Other treatments may include:  Nonsteroidal anti-inflammatory drugs (NSAIDs).  Physical therapy to stretch and strengthen your leg muscles.  Shoe inserts (orthotics) to take stress off your knee.  A knee brace or knee support.  Surgery to remove damaged cartilage or move the  patella to a better position. The need for surgery is rare. HOME CARE INSTRUCTIONS   Take medicines only as directed by your health care provider.  Rest your knee.  When resting, keep your knee raised above the level of your heart.  Avoid activities that cause knee pain.  Apply ice to the injured area:  Put ice in a plastic bag.  Place a towel between your skin and the bag.  Leave the ice on for 20 minutes, 2-3 times a day.  Use splints, braces, knee supports, or walking aids as directed by your health care provider.  Perform stretching and strengthening exercises as directed by your health care provider or physical therapist.  Keep all follow-up visits as directed by your health care provider. This is important. SEEK MEDICAL CARE IF:   Your symptoms get worse.  You are not improving with home care. MAKE SURE YOU:  Understand these instructions.  Will watch your condition.  Will get help right away if you are not doing well or get  worse.   This information is not intended to replace advice given to you by your health care provider. Make sure you discuss any questions you have with your health care provider.   Document Released: 09/16/2009 Document Revised: 10/19/2014 Document Reviewed: 12/18/2013 Elsevier Interactive Patient Education [employer].

## 2016-06-18 ENCOUNTER — Encounter: Payer: Self-pay | Admitting: Physician Assistant

## 2016-06-18 DIAGNOSIS — I700 Atherosclerosis of aorta: Secondary | ICD-10-CM | POA: Insufficient documentation

## 2016-06-18 DIAGNOSIS — J449 Chronic obstructive pulmonary disease, unspecified: Secondary | ICD-10-CM | POA: Insufficient documentation

## 2016-06-18 LAB — VITAMIN D 25 HYDROXY (VIT D DEFICIENCY, FRACTURES): VIT D 25 HYDROXY: 24 ng/mL — AB (ref 30–100)

## 2016-06-18 LAB — FERRITIN: FERRITIN: 75 ng/mL (ref 10–232)

## 2016-06-18 LAB — URINALYSIS, ROUTINE W REFLEX MICROSCOPIC
BILIRUBIN URINE: NEGATIVE
Glucose, UA: NEGATIVE
Hgb urine dipstick: NEGATIVE
Ketones, ur: NEGATIVE
Leukocytes, UA: NEGATIVE
Nitrite: NEGATIVE
PROTEIN: NEGATIVE
Specific Gravity, Urine: 1.025 (ref 1.001–1.035)
pH: 5 (ref 5.0–8.0)

## 2016-06-18 LAB — TSH: TSH: 2.21 mIU/L

## 2016-06-18 LAB — VITAMIN B12: VITAMIN B 12: 514 pg/mL (ref 200–1100)

## 2016-06-18 LAB — HEMOGLOBIN A1C
HEMOGLOBIN A1C: 4.9 % (ref <5.7)
Mean Plasma Glucose: 94 mg/dL

## 2016-06-18 LAB — MICROALBUMIN / CREATININE URINE RATIO
CREATININE, URINE: 153 mg/dL (ref 20–320)
Microalb Creat Ratio: 3 mcg/mg creat (ref <30)
Microalb, Ur: 0.5 mg/dL

## 2016-08-27 ENCOUNTER — Other Ambulatory Visit: Payer: Self-pay | Admitting: Internal Medicine

## 2016-09-08 MED FILL — CYANOCOBALAMIN 1,000 MCG/ML: 1000 | 84 days supply | Qty: 6 | Fill #0

## 2016-09-29 DIAGNOSIS — H524 Presbyopia: Secondary | ICD-10-CM | POA: Diagnosis not present

## 2016-09-29 DIAGNOSIS — H5213 Myopia, bilateral: Secondary | ICD-10-CM | POA: Diagnosis not present

## 2016-10-16 ENCOUNTER — Other Ambulatory Visit: Payer: Self-pay | Admitting: Internal Medicine

## 2016-10-16 DIAGNOSIS — Z1231 Encounter for screening mammogram for malignant neoplasm of breast: Secondary | ICD-10-CM

## 2016-11-20 ENCOUNTER — Other Ambulatory Visit: Payer: Self-pay | Admitting: Physician Assistant

## 2016-11-20 ENCOUNTER — Ambulatory Visit
Admission: RE | Admit: 2016-11-20 | Discharge: 2016-11-20 | Disposition: A | Discharge status: Home / Self Care | Payer: 59 | Source: Ambulatory Visit | Attending: Internal Medicine | Admitting: Internal Medicine

## 2016-11-20 DIAGNOSIS — Z1231 Encounter for screening mammogram for malignant neoplasm of breast: Secondary | ICD-10-CM

## 2016-11-23 ENCOUNTER — Other Ambulatory Visit: Payer: Self-pay | Admitting: Physician Assistant

## 2016-11-23 DIAGNOSIS — R928 Other abnormal and inconclusive findings on diagnostic imaging of breast: Secondary | ICD-10-CM

## 2016-12-01 ENCOUNTER — Ambulatory Visit
Admission: RE | Admit: 2016-12-01 | Discharge: 2016-12-01 | Disposition: A | Discharge status: Home / Self Care | Payer: 59 | Source: Ambulatory Visit | Attending: Physician Assistant | Admitting: Physician Assistant

## 2016-12-01 DIAGNOSIS — R928 Other abnormal and inconclusive findings on diagnostic imaging of breast: Secondary | ICD-10-CM

## 2016-12-01 DIAGNOSIS — R922 Inconclusive mammogram: Secondary | ICD-10-CM | POA: Diagnosis not present

## 2016-12-23 ENCOUNTER — Other Ambulatory Visit: Payer: Self-pay | Admitting: Internal Medicine

## 2016-12-25 ENCOUNTER — Ambulatory Visit: Payer: Self-pay | Admitting: Physician Assistant

## 2017-01-04 ENCOUNTER — Ambulatory Visit (INDEPENDENT_AMBULATORY_CARE_PROVIDER_SITE_OTHER): Payer: 59 | Admitting: Physician Assistant

## 2017-01-04 ENCOUNTER — Encounter: Payer: Self-pay | Admitting: Physician Assistant

## 2017-01-04 VITALS — BP 110/74 | HR 74 | Temp 97.6°F | Resp 16 | Ht 64.0 in | Wt 135.8 lb

## 2017-01-04 DIAGNOSIS — Z79899 Other long term (current) drug therapy: Secondary | ICD-10-CM

## 2017-01-04 DIAGNOSIS — D509 Iron deficiency anemia, unspecified: Secondary | ICD-10-CM

## 2017-01-04 DIAGNOSIS — R350 Frequency of micturition: Secondary | ICD-10-CM

## 2017-01-04 DIAGNOSIS — E538 Deficiency of other specified B group vitamins: Secondary | ICD-10-CM

## 2017-01-04 DIAGNOSIS — R5383 Other fatigue: Secondary | ICD-10-CM

## 2017-01-04 DIAGNOSIS — E559 Vitamin D deficiency, unspecified: Secondary | ICD-10-CM | POA: Diagnosis not present

## 2017-01-04 LAB — CBC WITH DIFFERENTIAL/PLATELET
BASOS PCT: 1 %
Basophils Absolute: 52 cells/uL (ref 0–200)
Eosinophils Absolute: 104 cells/uL (ref 15–500)
Eosinophils Relative: 2 %
HCT: 39.4 % (ref 35.0–45.0)
Hemoglobin: 12.7 g/dL (ref 11.7–15.5)
LYMPHS PCT: 41 %
Lymphs Abs: 2132 cells/uL (ref 850–3900)
MCH: 28.8 pg (ref 27.0–33.0)
MCHC: 32.2 g/dL (ref 32.0–36.0)
MCV: 89.3 fL (ref 80.0–100.0)
MONO ABS: 468 {cells}/uL (ref 200–950)
MONOS PCT: 9 %
MPV: 10.7 fL (ref 7.5–12.5)
NEUTROS ABS: 2444 {cells}/uL (ref 1500–7800)
Neutrophils Relative %: 47 %
PLATELETS: 226 10*3/uL (ref 140–400)
RBC: 4.41 MIL/uL (ref 3.80–5.10)
RDW: 12.8 % (ref 11.0–15.0)
WBC: 5.2 10*3/uL (ref 3.8–10.8)

## 2017-01-04 MED ORDER — AMITRIPTYLINE HCL 50 MG PO TABS: 50.0000 mg | ORAL_TABLET | Freq: Every day | ORAL | 3 refills | Status: DC

## 2017-01-04 NOTE — Progress Notes (Signed)
Subjective:    Patient ID: Carmen Hoffman, female    DOB: 11/22/1970, 46 y.o.   MRN: [id number]  HPI 46 y.o. WF with history of COPD, depression, B12/iron anemia presents with general fatigue. Has not had B12 infection at all this year. 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   Had bilateral leg swelling x 1 month but that has resolved after elevating and longer socks.   Blood pressure 110/74, pulse 74, temperature 97.6 F (36.4 C), resp. rate 16, height 5\' 4"  (1.626 m), weight 135 lb 12.8 oz (61.6 kg), last menstrual period 12/29/2016, SpO2 99 %.  Medications Current Outpatient Prescriptions on File Prior to Visit  Medication Sig  . cyanocobalamin (,VITAMIN B-12,) 1000 MCG/ML injection INJECT 1 ML INTO THE SKIN EVERY 14 DAYS.  Marland Kitchen cyclobenzaprine (FLEXERIL) 10 MG tablet 1 at night for 2 weeks then as needed for neck pain  . traMADol (ULTRAM) 50 MG tablet 1 pill twice daily as needed for pain  . [DISCONTINUED] gabapentin (NEURONTIN) 100 MG capsule Take 1-2 capsules (100-200 mg total) by mouth 3 (three) times daily as needed.   Current Facility-Administered Medications on File Prior to Visit  Medication  . methylPREDNISolone acetate (DEPO-MEDROL) injection 20 mg  . methylPREDNISolone acetate (DEPO-MEDROL) injection 80 mg    Problem list She has ANEMIA-IRON DEFICIENCY; ANXIETY; DEPRESSION; LOW BACK PAIN; Throat pain; GERD (gastroesophageal reflux disease); Genital herpes; B12 deficiency; Vitamin D deficiency; Occipital neuralgia; Neck pain on right side; Headache, tension-type; Tobacco abuse disorder; COPD (chronic obstructive pulmonary disease) ([place]); and Atherosclerosis of aorta ([place]) on her problem list.   Review of Systems  Constitutional: Positive for fatigue. Negative for activity change, appetite change, chills and unexpected weight change.  HENT: Negative for  congestion, mouth sores and sinus pressure.   Eyes: Negative for visual disturbance.  Respiratory: Negative for cough and chest tightness.   Gastrointestinal: Negative for abdominal pain and nausea.  Genitourinary: Positive for frequency. Negative for difficulty urinating and vaginal pain.  Musculoskeletal: Positive for back pain and neck pain. Negative for arthralgias, gait problem, joint swelling, myalgias and neck stiffness.  Skin: Negative for pallor and rash.  Neurological: Negative for dizziness, tremors, weakness, numbness and headaches.  Psychiatric/Behavioral: Negative for confusion and sleep disturbance.       Objective:   Physical Exam  Constitutional: She is oriented to person, place, and time. She appears well-developed and well-nourished.  HENT:  Head: Normocephalic and atraumatic.  Right Ear: External ear normal.  Left Ear: External ear normal.  Mouth/Throat: Oropharynx is clear and moist.  Eyes: Conjunctivae and EOM are normal. Pupils are equal, round, and reactive to light.  Neck: Normal range of motion. Neck supple. No thyromegaly present.  Cardiovascular: Normal rate, regular rhythm and normal heart sounds.  Exam reveals no gallop and no friction rub.   No murmur heard. Pulmonary/Chest: Effort normal and breath sounds normal. No respiratory distress. She has no wheezes.  Abdominal: Soft. Bowel sounds are normal. She exhibits no distension and no mass. There is no tenderness. There is no rebound and no guarding.  Musculoskeletal: Normal range of motion.  Lymphadenopathy:    She has no cervical adenopathy.  Neurological: She is alert and oriented to person, place, and time. She displays normal reflexes. No cranial nerve deficit. Coordination normal.  Skin: Skin is warm and dry.  Psychiatric: She has a normal  mood and affect.      Assessment & Plan:    B12 deficiency -     Vitamin B12 - will switch to sublingual  Iron deficiency anemia, unspecified iron  deficiency anemia type -     Ferritin -     Folate RBC -     Iron and TIBC  Vitamin D deficiency -     VITAMIN D 25 Hydroxy (Vit-D Deficiency, Fractures)  Medication management -     CBC with Differential/Platelet -     BASIC METABOLIC PANEL WITH GFR -     Hepatic function panel -     Magnesium  Other fatigue ? From lack of sleep/3rd shift, does not sound like sleep apnea ? From depression Will try elavil before bed for sleep/migraine prevention, follow up if not better -     TSH -     amitriptyline (ELAVIL) 50 MG tablet; Take 1 tablet (50 mg total) by mouth at bedtime.  Urinary frequency -     Urinalysis, Routine w reflex microscopic -     Urine culture  No future appointments.

## 2017-01-04 NOTE — Patient Instructions (Addendum)
Sublingual B12 2528mcg daily Let it dissolve under your tongue/in your mouth  Try the amitriptyline 50mg  before bed for sleep Can start out with 1/2 and go up to a whole This also helps prevent migraines  Varicose Veins Varicose veins are veins that have become enlarged and twisted. CAUSES This condition is the result of valves in the veins not working properly. Valves in the veins help return blood from the leg to the heart. When your calf muscles squeeze, the blood moves up your leg then the valves close and this continues until the blood gets back to your heart.  If these valves are damaged, blood flows backwards and backs up into the veins in the leg near the skin OR if your are sitting/standing for a long time without using your calf muscles the blood will back up into the veins in your legs. This causes the veins to become larger. People who are on their feet a lot, sit a lot without walking (like on a plane, at a desk, or in a car), who are pregnant, or who are overweight are more likely to develop varicose veins. SYMPTOMS   Bulging, twisted-appearing, bluish veins, most commonly found on the legs.  Leg pain or a feeling of heaviness. These symptoms may be worse at the end of the day.  Leg swelling.  Skin color changes. DIAGNOSIS  Varicose veins can usually be diagnosed with an exam of your legs by your caregiver. He or she may recommend an ultrasound of your leg veins. TREATMENT  Most varicose veins can be treated at home.However, other treatments are available for people who have persistent symptoms or who want to treat the cosmetic appearance of the varicose veins. But this is only cosmetic and they will return if not properly treated. These include:  Laser treatment of very small varicose veins.  Medicine that is shot (injected) into the vein. This medicine hardens the walls of the vein and closes off the vein. This treatment is called sclerotherapy. Afterwards, you may need to  wear clothing or bandages that apply pressure.  Surgery. HOME CARE INSTRUCTIONS   Do not stand or sit in one position for long periods of time. Do not sit with your legs crossed. Rest with your legs raised during the day.  Your legs have to be higher than your heart so that gravity will force the valves to open, so please really elevate your legs.   Wear elastic stockings or support hose. Do not wear other tight, encircling garments around the legs, pelvis, or waist.  [employer]  has a wide variety of well priced compression stockings. [place], [place] [id number] [id number] [place] ARE COPPER INFUSED COMPRESSION SOCKS AT [facility] OR [facility]  Walk as much as possible to increase blood flow.  Raise the foot of your bed at night with 2-inch blocks.  If you get a cut in the skin over the vein and the vein bleeds, lie down with your leg raised and press on it with a clean cloth until the bleeding stops. Then place a bandage (dressing) on the cut. See your caregiver if it continues to bleed or needs stitches. SEEK MEDICAL CARE IF:   The skin around your ankle starts to break down.  You have pain, redness, tenderness, or hard swelling developing in your leg over a vein.  You are uncomfortable due to leg pain. Document Released: 07/08/2005 Document Revised: 12/21/2011 Document Reviewed: 11/24/2010 ExitCare Patient Information 2014 [place].   Vitamin D goal is between 60-80  Please make sure that you are taking your Vitamin D as directed.   It is very important as a natural anti-inflammatory   helping hair, skin, and nails, as well as reducing stroke and heart attack risk.   It helps your bones and helps with mood.  We want you on at least 5000 IU daily  It also decreases numerous cancer risks so please take it as directed.   Low Vit D is associated with a 200-300% higher risk for CANCER   and 200-300% higher risk for HEART   ATTACK  &  STROKE.     .....................................Marland Kitchen  It is also associated with higher death rate at younger ages,   autoimmune diseases like Rheumatoid arthritis, Lupus, Multiple Sclerosis.     Also many other serious conditions, like depression, Alzheimer's  Dementia, infertility, muscle aches, fatigue, fibromyalgia - just to name a few.  +++++++++++++++++++  Can get liquid vitamin D from [place] here in [place] at  [facility] alternatives [address], [place], [place] [address] Or you can try earth fare

## 2017-01-05 LAB — HEPATIC FUNCTION PANEL
ALK PHOS: 38 U/L (ref 33–115)
ALT: 9 U/L (ref 6–29)
AST: 16 U/L (ref 10–30)
Albumin: 3.5 g/dL — ABNORMAL LOW (ref 3.6–5.1)
BILIRUBIN DIRECT: 0.1 mg/dL (ref <=0.2)
BILIRUBIN INDIRECT: 0.2 mg/dL (ref 0.2–1.2)
Total Bilirubin: 0.3 mg/dL (ref 0.2–1.2)
Total Protein: 5.7 g/dL — ABNORMAL LOW (ref 6.1–8.1)

## 2017-01-05 LAB — BASIC METABOLIC PANEL WITH GFR
BUN: 9 mg/dL (ref 7–25)
CO2: 28 mmol/L (ref 20–31)
Calcium: 9.2 mg/dL (ref 8.6–10.2)
Chloride: 104 mmol/L (ref 98–110)
Creat: 0.71 mg/dL (ref 0.50–1.10)
Glucose, Bld: 74 mg/dL (ref 65–99)
POTASSIUM: 4.8 mmol/L (ref 3.5–5.3)
Sodium: 140 mmol/L (ref 135–146)

## 2017-01-05 LAB — URINALYSIS, ROUTINE W REFLEX MICROSCOPIC
Bilirubin Urine: NEGATIVE
Glucose, UA: NEGATIVE
Ketones, ur: NEGATIVE
LEUKOCYTES UA: NEGATIVE
NITRITE: NEGATIVE
PH: 5 (ref 5.0–8.0)
Protein, ur: NEGATIVE
SPECIFIC GRAVITY, URINE: 1.017 (ref 1.001–1.035)

## 2017-01-05 LAB — TSH: TSH: 3.05 mIU/L

## 2017-01-05 LAB — URINALYSIS, MICROSCOPIC ONLY
BACTERIA UA: NONE SEEN [HPF]
Casts: NONE SEEN [LPF]
Yeast: NONE SEEN [HPF]

## 2017-01-05 LAB — VITAMIN D 25 HYDROXY (VIT D DEFICIENCY, FRACTURES): Vit D, 25-Hydroxy: 26 ng/mL — ABNORMAL LOW (ref 30–100)

## 2017-01-05 LAB — FOLATE RBC: RBC FOLATE: 474 ng/mL (ref >280)

## 2017-01-05 LAB — IRON AND TIBC
%SAT: 22 % (ref 11–50)
Iron: 62 ug/dL (ref 40–190)
TIBC: 283 ug/dL (ref 250–450)
UIBC: 221 ug/dL (ref 125–400)

## 2017-01-05 LAB — VITAMIN B12: Vitamin B-12: 531 pg/mL (ref 200–1100)

## 2017-01-05 LAB — MAGNESIUM: MAGNESIUM: 1.9 mg/dL (ref 1.5–2.5)

## 2017-01-05 LAB — FERRITIN: Ferritin: 73 ng/mL (ref 10–232)

## 2017-01-05 NOTE — Progress Notes (Signed)
LVM for pt to return office call for LAB results.

## 2017-01-05 NOTE — Progress Notes (Signed)
Pt aware of lab results & voiced understanding of those results.

## 2017-01-13 ENCOUNTER — Encounter: Payer: Self-pay | Admitting: Physician Assistant

## 2017-01-13 ENCOUNTER — Ambulatory Visit (INDEPENDENT_AMBULATORY_CARE_PROVIDER_SITE_OTHER): Payer: 59 | Admitting: Physician Assistant

## 2017-01-13 VITALS — BP 90/60 | HR 72 | Temp 98.1°F | Resp 16 | Ht 64.0 in | Wt 138.6 lb

## 2017-01-13 DIAGNOSIS — R1013 Epigastric pain: Secondary | ICD-10-CM | POA: Diagnosis not present

## 2017-01-13 DIAGNOSIS — R319 Hematuria, unspecified: Secondary | ICD-10-CM | POA: Diagnosis not present

## 2017-01-13 DIAGNOSIS — R140 Abdominal distension (gaseous): Secondary | ICD-10-CM

## 2017-01-13 LAB — URINALYSIS, ROUTINE W REFLEX MICROSCOPIC
Bilirubin Urine: NEGATIVE
GLUCOSE, UA: NEGATIVE
Hgb urine dipstick: NEGATIVE
Ketones, ur: NEGATIVE
LEUKOCYTES UA: NEGATIVE
Nitrite: NEGATIVE
PROTEIN: NEGATIVE
SPECIFIC GRAVITY, URINE: 1.014 (ref 1.001–1.035)
pH: 7 (ref 5.0–8.0)

## 2017-01-13 MED ORDER — METOCLOPRAMIDE HCL 5 MG PO TABS: 5.0000 mg | ORAL_TABLET | Freq: Three times a day (TID) | ORAL | 0 refills | Status: DC

## 2017-01-13 NOTE — Progress Notes (Signed)
Subjective:    Patient ID: Carmen Hoffman, female    DOB: 1970/11/18, 46 y.o.   MRN: [id number]  HPI 46 y.o. WF Q smoking 2017 (23 pack year smoking history) presents with fatigue and AB bloating x 3 months.  Last visit she had low vitamin D and hematuria with normal kidney function.  Will have AB bloating upper and lower, occ after food but will also just happen, nothing makes it better, fullness with small meals, some nausea no GERD,vomiting. , has BM daily does not have to strain, has been on activia yogurt x 3 months. No blood in stool, dark black stool. Has urinary frequency, no blood in urine.  Had menses last month, last PAP 2 years ago with Dr. Kenton Kingfisher GYN normal. No new partners, not sexually active right now.  Had EGD in 08/2014  Blood pressure 90/60, pulse 72, temperature 98.1 F (36.7 C), resp. rate 16, height 5\' 4"  (1.626 m), weight 138 lb 9.6 oz (62.9 kg), last menstrual period 12/29/2016, SpO2 99 %.  Medications Current Outpatient Prescriptions on File Prior to Visit  Medication Sig  . amitriptyline (ELAVIL) 50 MG tablet Take 1 tablet (50 mg total) by mouth at bedtime.  . cyanocobalamin (,VITAMIN B-12,) 1000 MCG/ML injection INJECT 1 ML INTO THE SKIN EVERY 14 DAYS.  Marland Kitchen cyclobenzaprine (FLEXERIL) 10 MG tablet 1 at night for 2 weeks then as needed for neck pain  . traMADol (ULTRAM) 50 MG tablet 1 pill twice daily as needed for pain  . [DISCONTINUED] gabapentin (NEURONTIN) 100 MG capsule Take 1-2 capsules (100-200 mg total) by mouth 3 (three) times daily as needed.   No current facility-administered medications on file prior to visit.     Problem list She has Iron deficiency anemia; ANXIETY; DEPRESSION; LOW BACK PAIN; Throat pain; GERD (gastroesophageal reflux disease); Genital herpes; B12 deficiency; Vitamin D deficiency; Occipital neuralgia; Neck pain on right side; Headache, tension-type; Tobacco abuse disorder; COPD (chronic obstructive pulmonary disease) ([place]); and  Atherosclerosis of aorta ([place]) on her problem list.   Review of Systems  Constitutional: Positive for fatigue. Negative for activity change, appetite change, chills, diaphoresis, fever and unexpected weight change.  HENT: Negative.  Negative for congestion, mouth sores and sinus pressure.   Eyes: Negative for visual disturbance.  Respiratory: Negative for cough, chest tightness and shortness of breath.   Cardiovascular: Negative for chest pain, palpitations and leg swelling.  Gastrointestinal: Positive for abdominal distention and abdominal pain. Negative for anal bleeding, blood in stool, constipation, diarrhea, nausea, rectal pain and vomiting.  Genitourinary: Positive for frequency. Negative for decreased urine volume, difficulty urinating, genital sores, pelvic pain, urgency, vaginal bleeding, vaginal discharge and vaginal pain.  Musculoskeletal: Positive for back pain. Negative for arthralgias, gait problem, joint swelling, myalgias, neck pain and neck stiffness.  Skin: Negative for pallor and rash.  Neurological: Negative for dizziness, tremors, weakness, numbness and headaches.  Hematological: Negative.   Psychiatric/Behavioral: Negative.  Negative for confusion and sleep disturbance.       Objective:   Physical Exam  Constitutional: She is oriented to person, place, and time. She appears well-developed and well-nourished.  HENT:  Head: Normocephalic and atraumatic.  Right Ear: External ear normal.  Left Ear: External ear normal.  Mouth/Throat: Oropharynx is clear and moist.  Eyes: Conjunctivae and EOM are normal. Pupils are equal, round, and reactive to light.  Neck: Normal range of motion. Neck supple. No thyromegaly present.  Cardiovascular: Normal rate, regular rhythm and normal heart sounds.  Exam  reveals no gallop and no friction rub.   No murmur heard. Pulmonary/Chest: Effort normal and breath sounds normal. No respiratory distress. She has no wheezes.  Abdominal: Soft.  Bowel sounds are normal. She exhibits no distension and no mass. There is tenderness (epigastric and LLQ, no rebound, no guarding). There is no rebound and no guarding.  Musculoskeletal: Normal range of motion.  Lymphadenopathy:    She has no cervical adenopathy.  Neurological: She is alert and oriented to person, place, and time. She displays normal reflexes. No cranial nerve deficit. Coordination normal.  Skin: Skin is warm and dry. No rash noted.  Psychiatric: She has a normal mood and affect.      Assessment & Plan:  1. Abdominal bloating X 3 months, no red flag symptoms, not associated with certain foods, will not check celiac at this time but may need evaluation if continues Stop activia, start on reglan and prilosec, will refer to GI - US Abdomen Complete; Future - US Transvaginal Non-OB; Future - metoCLOPramide (REGLAN) 5 MG tablet; Take 1 tablet (5 mg total) by mouth 3 (three) times daily before meals.  Dispense: 60 tablet; Refill: 0 - Ambulatory referral to Gastroenterology  2. Hematuria, unspecified type Get [place] with AB bloating to evaluate ovaries/uterus, check urine since history of smoking and last visit had hematuria.  - US Transvaginal Non-OB; Future - Urinalysis, Routine w reflex microscopic - Urine culture

## 2017-01-13 NOTE — Patient Instructions (Addendum)
Stop the activia, can do benefiber which is synthetic and does not cause bloating Look up FODMAP diet and follow it.  Will give you reglan that you can take AS needed 20-30 mins before food Get on OTC omeprazole 20mg  daily in the morning  Will get AB [place] Will repeat urine  Will send to GI for evaluation  Patient advised to go to the ER if she has any melena, hematochezia, coffee ground emesis, severe pain, severe shortness of breath or chest pain.     Abdominal Pain, Adult Abdominal pain can be caused by many things. Often, abdominal pain is not serious and it gets better with no treatment or by being treated at home. However, sometimes abdominal pain is serious. Your health care provider will do a medical history and a physical exam to try to determine the cause of your abdominal pain. Follow these instructions at home:  Take over-the-counter and prescription medicines only as told by your health care provider. Do not take a laxative unless told by your health care provider.  Drink enough fluid to keep your urine clear or pale yellow.  Watch your condition for any changes.  Keep all follow-up visits as told by your health care provider. This is important. Contact a health care provider if:  Your abdominal pain changes or gets worse.  You are not hungry or you lose weight without trying.  You are constipated or have diarrhea for more than 2-3 days.  You have pain when you urinate or have a bowel movement.  Your abdominal pain wakes you up at night.  Your pain gets worse with meals, after eating, or with certain foods.  You are throwing up and cannot keep anything down.  You have a fever. Get help right away if:  Your pain does not go away as soon as your health care provider told you to expect.  You cannot stop throwing up.  Your pain is only in areas of the abdomen, such as the right side or the left lower portion of the abdomen.  You have bloody or black stools, or  stools that look like tar.  You have severe pain, cramping, or bloating in your abdomen.  You have signs of dehydration, such as:  Dark urine, very little urine, or no urine.  Cracked lips.  Dry mouth.  Sunken eyes.  Sleepiness.  Weakness. This information is not intended to replace advice given to you by your health care provider. Make sure you discuss any questions you have with your health care provider. Document Released: 07/08/2005 Document Revised: 04/17/2016 Document Reviewed: 03/11/2016 Elsevier Interactive Patient Education  2017 [employer].   10 Tips on Belching, Bloating, and Flatulence 1. Belching is caused by swallowed air from:  Eating or drinking too fast  Poorly fitting dentures; not chewing food completely  Carbonated beverages  Chewing gum or sucking on hard candies  Excessive swallowing due to nervous tension or postnasal drip  Forced belching to relieve abdominal discomfort 2. To prevent excessive belching, avoid:  Carbonated beverages  Chewing gum  Hard candies  Simethicone/GasX may be helpful  3. Abdominal bloating and discomfort may be due to intestinal sensitivity or symptoms of irritable bowel syndrome. To relieve symptoms, avoid:  Broccoli  Baked beans  Cabbage  Carbonated drinks  Cauliflower  Chewing gum  Hard candy 4. Abdominal distention resulting from weak abdominal muscles:  Is better in the morning  Gets worse as the day progresses  Is relieved by lying down 5.  To prevent Abdominal distention:  Tighten abdominal muscles by pulling in your stomach several times during the day  Do sit-up exercises if possible  Wear an abdominal support garment if exercise is too difficult 6. Flatulence is gas created through bacterial action in the bowel and passed rectally. Keep in mind that:  10-18 passages per day are normal  Primary gases are harmless and odorless  Noticeable smells are trace gases related to food intake 7. Foods that are  likely to form gas include:  Milk, dairy products, and medications that contain lactose--If your body doesn't produce the enzyme (lactase) to break it down.  Certain vegetables--baked beans, cauliflower, broccoli, cabbage  Certain starches--wheat, oats, corn, potatoes. Rice is a good substitute. 8. Identify offending foods. Reduce or eliminate these gas-forming foods from your diet.

## 2017-01-14 LAB — URINE CULTURE: Organism ID, Bacteria: NO GROWTH

## 2017-01-14 NOTE — Progress Notes (Signed)
Pt aware of lab results & voiced understanding of those results.

## 2017-01-14 NOTE — Addendum Note (Signed)
Addended by: Vicie Mutters R on: 01/14/2017 02:49 PM   Modules accepted: Orders

## 2017-01-18 NOTE — Progress Notes (Signed)
LVM for pt to return office call for LAB results.

## 2017-01-18 NOTE — Progress Notes (Signed)
Pt aware of lab results & voiced understanding of those results.

## 2017-01-21 ENCOUNTER — Other Ambulatory Visit: Payer: Self-pay | Admitting: *Deleted

## 2017-01-21 ENCOUNTER — Encounter: Payer: Self-pay | Admitting: Physician Assistant

## 2017-01-21 ENCOUNTER — Ambulatory Visit (INDEPENDENT_AMBULATORY_CARE_PROVIDER_SITE_OTHER): Payer: 59 | Admitting: Physician Assistant

## 2017-01-21 ENCOUNTER — Other Ambulatory Visit (HOSPITAL_COMMUNITY)
Admission: RE | Admit: 2017-01-21 | Discharge: 2017-01-21 | Disposition: A | Discharge status: Home / Self Care | Payer: 59 | Source: Ambulatory Visit | Attending: Physician Assistant | Admitting: Physician Assistant

## 2017-01-21 VITALS — BP 92/60 | HR 71 | Temp 97.3°F | Resp 14 | Ht 64.0 in | Wt 133.4 lb

## 2017-01-21 DIAGNOSIS — R232 Flushing: Secondary | ICD-10-CM | POA: Diagnosis not present

## 2017-01-21 DIAGNOSIS — Z124 Encounter for screening for malignant neoplasm of cervix: Secondary | ICD-10-CM | POA: Insufficient documentation

## 2017-01-21 DIAGNOSIS — M791 Myalgia, unspecified site: Secondary | ICD-10-CM

## 2017-01-21 MED ORDER — PROMETHAZINE HCL 25 MG PO TABS: 25.0000 mg | ORAL_TABLET | Freq: Four times a day (QID) | ORAL | 0 refills | Status: DC | PRN

## 2017-01-21 MED ORDER — FUSION PLUS PO CAPS: 1.0000 | ORAL_CAPSULE | Freq: Every day | ORAL | 3 refills | Status: DC

## 2017-01-21 MED ORDER — ALPRAZOLAM 0.5 MG PO TABS: 0.5000 mg | ORAL_TABLET | Freq: Three times a day (TID) | ORAL | 0 refills | Status: AC | PRN

## 2017-01-21 NOTE — Patient Instructions (Addendum)
Stop the reglan, this can be causing some of your issues. Take xanax 0.5 1/2 AS needed for anxiety Take the phenergan 1/2 pill as needed for nausea or sleep  Go to the ER if the symptoms get any worse  Follow up GI Follow up with GYN  About Cystocele  Overview  The pelvic organs, including the bladder, are normally supported by pelvic floor muscles and ligaments.  When these muscles and ligaments are stretched, weakened or torn, the wall between the bladder and the vagina sags or herniates causing a prolapse, sometimes called a cystocele.  This condition may cause discomfort and problems with emptying the bladder.  It can be present in various stages.  Some people are not aware of the changes.  Others may notice changes at the vaginal opening or a feeling of the bladder dropping outside the body.  Causes of a Cystocele  A cystocele is usually caused by muscle straining or stretching during childbirth.  In addition, cystocele is more common after menopause, because the hormone estrogen helps keep the elastic tissues around the pelvic organs strong.  A cystocele is more likely to occur when levels of estrogen decrease.  Other causes include: heavy lifting, chronic coughing, previous pelvic surgery and obesity.  Symptoms  A bladder that has dropped from its normal position may cause: unwanted urine leakage (stress incontinence), frequent urination or urge to urinate, incomplete emptying of the bladder (not feeling bladder relief after emptying), pain or discomfort in the vagina, pelvis, groin, lower back or lower abdomen and frequent urinary tract infections.  Mild cases may not cause any symptoms.  Treatment Options  Pelvic floor (Kegel) exercises:  Strength training the muscles in your genital area  Behavioral changes: Treating and preventing constipation, taking time to empty your bladder properly, learning to lift properly and/or avoid heavy lifting when possible, stopping smoking,  avoiding weight gain and treating a chronic cough or bronchitis.  A pessary: A vaginal support device is sometimes used to help pelvic support caused by muscle and ligament changes.  Surgery: Surgical repair may be necessary if symptoms cannot be managed with exercise, behavioral changes and a pessary.  Surgery is usually considered for severe cases.   2007, [employer]

## 2017-01-21 NOTE — Progress Notes (Signed)
Subjective:    Patient ID: Carmen Hoffman, female    DOB: Aug 25, 1971, 46 y.o.   MRN: [id number]  HPI 46 y.o. WF Q smoking 2017 (23 pack year smoking history) presents with possible panic attack and urinary incontinence.  She states she was at work drawing blood when she felt like she may have had a panic attack, no trigger, she got warm, flushed, SOB, palpitations, nausea, BP was 120/60, no impending doom, started at 9 and symptoms improved at 2 AM. Took amitriptyline Sunday but states was wired up and does not want to take it.  She was recently seen for fatigue/AB bloating, has had normal labs, pending [place] (20th) and has follow up with GI (8th). Leg and hands feel tired, upper AB feels like butterflies, has been having suprapubic pressure, some urinary leakage. Has been on reglan 3 x a day.   Had EGD in 08/2014, last PAP normal 2 years ago with Dr. Kenton Kingfisher.   Blood pressure 92/60, pulse 71, temperature 97.3 F (36.3 C), resp. rate 14, height 5\' 4"  (1.626 m), weight 133 lb 6.4 oz (60.5 kg), last menstrual period 12/29/2016, SpO2 97 %.  Medications Current Outpatient Prescriptions on File Prior to Visit  Medication Sig  . amitriptyline (ELAVIL) 50 MG tablet Take 1 tablet (50 mg total) by mouth at bedtime.  . cyanocobalamin (,VITAMIN B-12,) 1000 MCG/ML injection INJECT 1 ML INTO THE SKIN EVERY 14 DAYS.  Marland Kitchen cyclobenzaprine (FLEXERIL) 10 MG tablet 1 at night for 2 weeks then as needed for neck pain  . metoCLOPramide (REGLAN) 5 MG tablet Take 1 tablet (5 mg total) by mouth 3 (three) times daily before meals.  . traMADol (ULTRAM) 50 MG tablet 1 pill twice daily as needed for pain  . [DISCONTINUED] gabapentin (NEURONTIN) 100 MG capsule Take 1-2 capsules (100-200 mg total) by mouth 3 (three) times daily as needed.   No current facility-administered medications on file prior to visit.     Problem list She has Iron deficiency anemia; ANXIETY; DEPRESSION; LOW BACK PAIN; Throat pain; GERD  (gastroesophageal reflux disease); Genital herpes; B12 deficiency; Vitamin D deficiency; Occipital neuralgia; Neck pain on right side; Headache, tension-type; Tobacco abuse disorder; COPD (chronic obstructive pulmonary disease) ([place]); and Atherosclerosis of aorta ([place]) on her problem list.   Review of Systems  Constitutional: Positive for appetite change. Negative for activity change, chills, diaphoresis, fatigue, fever and unexpected weight change.  HENT: Negative.   Eyes: Negative.   Respiratory: Positive for shortness of breath (during panic attack). Negative for apnea, cough, choking, chest tightness, wheezing and stridor.   Cardiovascular: Positive for palpitations (during panic attack). Negative for chest pain and leg swelling.  Gastrointestinal: Positive for abdominal distention and abdominal pain. Negative for anal bleeding, blood in stool, constipation, diarrhea, nausea, rectal pain and vomiting.  Genitourinary: Positive for frequency, pelvic pain and urgency. Negative for decreased urine volume, difficulty urinating, dysuria, flank pain, genital sores, hematuria, menstrual problem, vaginal bleeding, vaginal discharge and vaginal pain.  Musculoskeletal: Negative.   Skin: Negative.   Neurological: Positive for dizziness. Negative for tremors, seizures, syncope, facial asymmetry, speech difficulty, weakness, light-headedness, numbness and headaches.  Psychiatric/Behavioral: The patient is nervous/anxious.        Objective:   Physical Exam  Constitutional: She is oriented to person, place, and time. She appears well-developed and well-nourished.  HENT:  Head: Normocephalic and atraumatic.  Right Ear: External ear normal.  Left Ear: External ear normal.  Mouth/Throat: Oropharynx is clear and moist. No oropharyngeal  exudate.  Eyes: Conjunctivae and EOM are normal. Pupils are equal, round, and reactive to light.  Neck: Normal range of motion. Neck supple. No thyromegaly present.   Cardiovascular: Normal rate, regular rhythm and normal heart sounds.  Exam reveals no gallop and no friction rub.   No murmur heard. Pulmonary/Chest: Effort normal and breath sounds normal. No respiratory distress. She has no wheezes.  Abdominal: Soft. Bowel sounds are normal. She exhibits no distension and no mass. There is tenderness (epigastric and LLQ, no rebound, no guarding). There is no rebound and no guarding.  Genitourinary: Uterus is enlarged and fixed.  Genitourinary Comments: nontender cervix, no discharge.   Musculoskeletal: Normal range of motion.  Lymphadenopathy:    She has no cervical adenopathy.  Neurological: She is alert and oriented to person, place, and time. She displays normal reflexes. No cranial nerve deficit. Coordination normal.  Skin: Skin is warm and dry. No rash noted. She is not diaphoretic.  Psychiatric: She has a normal mood and affect.      Assessment & Plan:  Muscle pain - Magnesium - CK   Flushing ? From reglan, stop the reglan, can take phenerghan and xanax PRN No diarrhea, no CP - CBC with Differential/Platelet - BASIC METABOLIC PANEL WITH GFR - Hepatic function panel - TSH - Magnesium  Screening for cervical cancer Still need to get [place], firm uterus - Cytology - PAP  The patient was advised to call immediately if she has any concerning symptoms in the interval. The patient voices understanding of current treatment options and is in agreement with the current care plan.The patient knows to call the clinic with any problems, questions or concerns or go to the ER if any further progression of symptoms.

## 2017-01-22 ENCOUNTER — Ambulatory Visit
Admission: RE | Admit: 2017-01-22 | Discharge: 2017-01-22 | Disposition: A | Discharge status: Home / Self Care | Payer: 59 | Source: Ambulatory Visit | Attending: Physician Assistant | Admitting: Physician Assistant

## 2017-01-22 DIAGNOSIS — R319 Hematuria, unspecified: Secondary | ICD-10-CM | POA: Diagnosis not present

## 2017-01-22 DIAGNOSIS — R140 Abdominal distension (gaseous): Secondary | ICD-10-CM

## 2017-01-22 LAB — HEPATIC FUNCTION PANEL
ALBUMIN: 4 g/dL (ref 3.6–5.1)
ALK PHOS: 43 U/L (ref 33–115)
ALT: 11 U/L (ref 6–29)
AST: 18 U/L (ref 10–35)
BILIRUBIN TOTAL: 0.4 mg/dL (ref 0.2–1.2)
Bilirubin, Direct: 0.1 mg/dL (ref <=0.2)
Indirect Bilirubin: 0.3 mg/dL (ref 0.2–1.2)
TOTAL PROTEIN: 6.3 g/dL (ref 6.1–8.1)

## 2017-01-22 LAB — CBC WITH DIFFERENTIAL/PLATELET
BASOS ABS: 61 {cells}/uL (ref 0–200)
Basophils Relative: 1 %
EOS ABS: 61 {cells}/uL (ref 15–500)
Eosinophils Relative: 1 %
HEMATOCRIT: 42.4 % (ref 35.0–45.0)
HEMOGLOBIN: 13.9 g/dL (ref 11.7–15.5)
LYMPHS ABS: 1891 {cells}/uL (ref 850–3900)
Lymphocytes Relative: 31 %
MCH: 29.4 pg (ref 27.0–33.0)
MCHC: 32.8 g/dL (ref 32.0–36.0)
MCV: 89.8 fL (ref 80.0–100.0)
MPV: 10.4 fL (ref 7.5–12.5)
Monocytes Absolute: 488 cells/uL (ref 200–950)
Monocytes Relative: 8 %
NEUTROS ABS: 3599 {cells}/uL (ref 1500–7800)
NEUTROS PCT: 59 %
Platelets: 224 10*3/uL (ref 140–400)
RBC: 4.72 MIL/uL (ref 3.80–5.10)
RDW: 12.9 % (ref 11.0–15.0)
WBC: 6.1 10*3/uL (ref 3.8–10.8)

## 2017-01-22 LAB — BASIC METABOLIC PANEL WITH GFR
BUN: 12 mg/dL (ref 7–25)
CO2: 25 mmol/L (ref 20–31)
CREATININE: 0.66 mg/dL (ref 0.50–1.10)
Calcium: 9.6 mg/dL (ref 8.6–10.2)
Chloride: 102 mmol/L (ref 98–110)
GFR, Est Non African American: >89 mL/min (ref >=60)
GLUCOSE: 81 mg/dL (ref 65–99)
POTASSIUM: 3.8 mmol/L (ref 3.5–5.3)
Sodium: 138 mmol/L (ref 135–146)

## 2017-01-22 LAB — TSH: TSH: 2.49 m[IU]/L

## 2017-01-22 LAB — CK: CK TOTAL: 88 U/L (ref 7–177)

## 2017-01-22 LAB — MAGNESIUM: MAGNESIUM: 1.9 mg/dL (ref 1.5–2.5)

## 2017-01-22 NOTE — Progress Notes (Signed)
LVM for pt to return office call for LAB results.

## 2017-01-23 ENCOUNTER — Encounter (HOSPITAL_COMMUNITY): Payer: Self-pay | Admitting: Emergency Medicine

## 2017-01-23 ENCOUNTER — Emergency Department (HOSPITAL_COMMUNITY)
Admission: EM | Admit: 2017-01-23 | Discharge: 2017-01-23 | Disposition: A | Discharge status: Home / Self Care | Payer: 59 | Attending: Emergency Medicine | Admitting: Emergency Medicine

## 2017-01-23 DIAGNOSIS — Z79899 Other long term (current) drug therapy: Secondary | ICD-10-CM | POA: Diagnosis not present

## 2017-01-23 DIAGNOSIS — Z87891 Personal history of nicotine dependence: Secondary | ICD-10-CM | POA: Insufficient documentation

## 2017-01-23 DIAGNOSIS — R42 Dizziness and giddiness: Secondary | ICD-10-CM | POA: Insufficient documentation

## 2017-01-23 DIAGNOSIS — J449 Chronic obstructive pulmonary disease, unspecified: Secondary | ICD-10-CM | POA: Diagnosis not present

## 2017-01-23 LAB — COMPREHENSIVE METABOLIC PANEL
ALBUMIN: 3.5 g/dL (ref 3.5–5.0)
ALT: 15 U/L (ref 14–54)
AST: 21 U/L (ref 15–41)
Alkaline Phosphatase: 40 U/L (ref 38–126)
Anion gap: 5 (ref 5–15)
BUN: 11 mg/dL (ref 6–20)
CO2: 27 mmol/L (ref 22–32)
CREATININE: 0.75 mg/dL (ref 0.44–1.00)
Calcium: 8.2 mg/dL — ABNORMAL LOW (ref 8.9–10.3)
Chloride: 105 mmol/L (ref 101–111)
GFR calc Af Amer: >60 mL/min (ref >60)
GLUCOSE: 160 mg/dL — AB (ref 65–99)
POTASSIUM: 4.3 mmol/L (ref 3.5–5.1)
Sodium: 137 mmol/L (ref 135–145)
Total Bilirubin: 0.5 mg/dL (ref 0.3–1.2)
Total Protein: 5.8 g/dL — ABNORMAL LOW (ref 6.5–8.1)

## 2017-01-23 LAB — URINALYSIS, ROUTINE W REFLEX MICROSCOPIC
BACTERIA UA: NONE SEEN
BILIRUBIN URINE: NEGATIVE
Glucose, UA: NEGATIVE mg/dL
Ketones, ur: NEGATIVE mg/dL
Nitrite: NEGATIVE
PH: 5 (ref 5.0–8.0)
Protein, ur: 30 mg/dL — AB
SPECIFIC GRAVITY, URINE: 1.028 (ref 1.005–1.030)

## 2017-01-23 LAB — LIPASE, BLOOD: LIPASE: 27 U/L (ref 11–51)

## 2017-01-23 LAB — CBC
HEMATOCRIT: 42.2 % (ref 36.0–46.0)
Hemoglobin: 13.8 g/dL (ref 12.0–15.0)
MCH: 29.5 pg (ref 26.0–34.0)
MCHC: 32.7 g/dL (ref 30.0–36.0)
MCV: 90.2 fL (ref 78.0–100.0)
PLATELETS: 177 10*3/uL (ref 150–400)
RBC: 4.68 MIL/uL (ref 3.87–5.11)
RDW: 12.6 % (ref 11.5–15.5)
WBC: 6.7 10*3/uL (ref 4.0–10.5)

## 2017-01-23 MED ORDER — MECLIZINE HCL 25 MG PO TABS
25.0000 mg | ORAL_TABLET | Freq: Once | ORAL | Status: AC
  Administered 2017-01-23: 25 mg via ORAL
  Filled 2017-01-23: qty 1

## 2017-01-23 NOTE — Discharge Instructions (Signed)
I'm glad your symptoms have resolved. We did not find anything concerning on your blood work. If you experience more of these symptoms please let your Primary Care Provider know. If your dizziness persists for longer than 6 hours, or your experience weakness in one arm or leg, difficulty walking, or difficulty speaking, you should return to the ED.

## 2017-01-23 NOTE — ED Notes (Signed)
Pt asked to give a urine sample. Pt stated that one was given in Triage.

## 2017-01-23 NOTE — ED Triage Notes (Signed)
Pt. Stated, I've had dizziness this morning with vomiting.  My stomach hurts some.

## 2017-01-23 NOTE — ED Provider Notes (Signed)
[facility] DEPT Provider Note   CSN: [id number] Arrival date & time: 01/23/17  1150     History   Chief Complaint Chief Complaint  Patient presents with  . Dizziness  . Emesis    HPI Carmen Hoffman is a 46 y.o. female.  Patient was uncomfortable past medical history presents with complaint of acute onset of vertigo-like dizziness which began this morning when she stood up associated with nausea and vomiting patient reports that she is feeling improved upon arrival to the ED but still feels generally weak all over. She says her symptoms began this morning when standing from sitting position after resting on her sofa. She has not eaten anything today. She denies any headaches, focal neurological deficits, fever, chills, diarrhea. She does report some suprapubic abdominal fullness and bloating which is unchanged from the symptoms which she started expensive for the past 3 months. She's been followed with her PCP for workup of the symptoms. They do not seem exacerbated at this time. Patient denies any specific positions which exacerbate or illicit her symptoms, she says they are slightly worse with standing than with sitting or lying. She has not had any episodes of this prior.      Past Medical History:  Diagnosis Date  . ANEMIA-IRON DEFICIENCY 05/30/2007  . Anxiety   . Aphthous stomatitis   . Arthritis   . Depression   . Genital herpes   . GERD (gastroesophageal reflux disease)   . Migraines     Patient Active Problem List   Diagnosis Date Noted  . COPD (chronic obstructive pulmonary disease) ([place]) 06/18/2016  . Atherosclerosis of aorta ([place]) 06/18/2016  . Tobacco abuse disorder 06/06/2014  . Headache, tension-type 05/17/2013  . Occipital neuralgia 03/27/2013  . Neck pain on right side 03/27/2013  . B12 deficiency 10/17/2012  . Vitamin D deficiency 10/17/2012  . GERD (gastroesophageal reflux disease)   . Genital herpes   . LOW BACK PAIN 07/26/2008  . Throat pain  07/26/2008  . ANXIETY 06/15/2008  . Iron deficiency anemia 05/30/2007  . DEPRESSION 05/30/2007    History reviewed. No pertinent surgical history.  OB History    No data available       Home Medications    Prior to Admission medications   Medication Sig Start Date End Date Taking? Authorizing Provider  ALPRAZolam Duanne Moron) 0.5 MG tablet Take 1 tablet (0.5 mg total) by mouth 3 (three) times daily as needed for sleep or anxiety. 01/21/17 01/21/18  Vicie Mutters, PA-C  cyanocobalamin (,VITAMIN B-12,) 1000 MCG/ML injection INJECT 1 ML INTO THE SKIN EVERY 14 DAYS. 08/27/16   Aleksei Plotnikov V, MD  cyclobenzaprine (FLEXERIL) 10 MG tablet 1 at night for 2 weeks then as needed for neck pain 06/17/16   Vicie Mutters, PA-C  Iron-FA-B Cmp-C-Biot-Probiotic (FUSION PLUS) CAPS Take 1 tablet by mouth daily. 01/21/17   Vicie Mutters, PA-C  promethazine (PHENERGAN) 25 MG tablet Take 1 tablet (25 mg total) by mouth every 6 (six) hours as needed for nausea or vomiting. Max: 4 tablets per day 01/21/17   Vicie Mutters, PA-C  traMADol Veatrice Bourbon) 50 MG tablet 1 pill twice daily as needed for pain 06/17/16   Vicie Mutters, PA-C    Family History Family History  Problem Relation Age of Onset  . Fibromyalgia Mother   . Hypertension Mother   . Hypertension Father   . Arthritis Brother     rheumatoid  . Cancer Paternal Uncle   . Heart disease Maternal Grandfather  Social History Social History  Substance Use Topics  . Smoking status: Former Smoker    Packs/day: 1.50    Years: 15.00    Types: Cigarettes    Quit date: 05/24/2016  . Smokeless tobacco: Never Used  . Alcohol use No     Allergies   Diflucan [fluconazole]   Review of Systems Review of Systems  Constitutional: Negative for activity change, appetite change, chills, diaphoresis, fatigue, fever and unexpected weight change.  HENT: Negative for congestion, sinus pressure and sore throat.   Eyes: Negative for visual disturbance.    Respiratory: Negative for cough, chest tightness, shortness of breath and wheezing.   Cardiovascular: Negative for chest pain and leg swelling.  Gastrointestinal: Positive for nausea and vomiting. Negative for abdominal distention, abdominal pain, constipation and diarrhea.  Endocrine: Negative for polydipsia and polyuria.  Genitourinary: Negative for dysuria, frequency, hematuria, pelvic pain, vaginal bleeding and vaginal pain.  Musculoskeletal: Negative for arthralgias.  Skin: Negative for rash.  Neurological: Positive for dizziness and light-headedness. Negative for tremors, syncope, weakness and headaches.  Psychiatric/Behavioral: Negative for agitation and confusion.     Physical Exam Updated Vital Signs BP 96/62   Pulse 63   Temp 98.3 F (36.8 C) (Oral)   Resp 18   Ht 5\' 2"  (1.575 m)   Wt 59 kg   LMP 12/29/2016   SpO2 100%   BMI 23.78 kg/m   Physical Exam  Constitutional: She is oriented to person, place, and time. She appears well-developed and well-nourished. She appears ill. No distress.  HENT:  Head: Normocephalic and atraumatic.  Eyes: Conjunctivae are normal.  Neck: Neck supple.  Cardiovascular: Normal rate and regular rhythm.   No murmur heard. Pulmonary/Chest: Effort normal and breath sounds normal. No respiratory distress.  Abdominal: Soft. Bowel sounds are normal. She exhibits no distension. There is tenderness (mild TTP) in the suprapubic area. There is no rigidity, no rebound and no guarding.  Musculoskeletal: She exhibits no edema.  Neurological: She is alert and oriented to person, place, and time. She has normal strength. No cranial nerve deficit or sensory deficit. Coordination normal.  Skin: Skin is warm and dry.  Psychiatric: She has a normal mood and affect.  Nursing note and vitals reviewed.   ED Treatments / Results  Labs (all labs ordered are listed, but only abnormal results are displayed) Labs Reviewed  COMPREHENSIVE METABOLIC PANEL -  Abnormal; Notable for the following:       Result Value   Glucose, Bld 160 (*)    Calcium 8.2 (*)    Total Protein 5.8 (*)    All other components within normal limits  URINALYSIS, ROUTINE W REFLEX MICROSCOPIC - Abnormal; Notable for the following:    Color, Urine AMBER (*)    APPearance CLOUDY (*)    Hgb urine dipstick MODERATE (*)    Protein, ur 30 (*)    Leukocytes, UA MODERATE (*)    Squamous Epithelial / LPF TOO NUMEROUS TO COUNT (*)    All other components within normal limits  LIPASE, BLOOD  CBC    EKG  EKG Interpretation None       Radiology US Transvaginal Non-ob  Result Date: 01/22/2017 CLINICAL DATA:  Initial evaluation for abdominal pain and bloating, hematuria. EXAM: TRANSABDOMINAL AND TRANSVAGINAL ULTRASOUND OF PELVIS TECHNIQUE: Both transabdominal and transvaginal ultrasound examinations of the pelvis were performed. Transabdominal technique was performed for global imaging of the pelvis including uterus, ovaries, adnexal regions, and pelvic cul-de-sac. It was necessary to proceed  with endovaginal exam following the transabdominal exam to visualize the uterus and ovaries. COMPARISON:  None FINDINGS: Uterus Measurements: 8.8 x 4.1 x 5.5 cm. There is an echogenic mass measuring 2.5 x 1.7 x 1.7 cm positioned at the mid - lower aspect of the uterus. This is favored to be submucosal in nature. Endometrium Thickness: 13 mm.  No focal abnormality visualized. Right ovary Measurements: 3.4 x 1.9 x 2.6 cm. Normal appearance/no adnexal mass. Two adjacent cysts versus a single cyst with single internal thin septation present within the right ovary, measuring the 2 2.4 x 1.4 x 1.5 cm. Left ovary Not visualized. No mass lesion or other abnormality seen within the left adnexa. Other findings No abnormal free fluid. IMPRESSION: 1. 2.5 x 1.7 x 1.7 cm mass positioned at the mid - lower aspect of the uterus. A submucosal fibroid is favored. Further evaluation with dedicated sonohysterography  is suggested, as this may be helpful for further delineation. Additionally, pelvic MRI would also likely be helpful for further characterization as well. 2. Right ovarian cysts as above, most consistent with normal physiologic cysts. 3. Nonvisualization of the left ovary. Electronically Signed   By: Jeannine Boga M.D.   On: 01/22/2017 13:49   US Pelvis Complete  Result Date: 01/22/2017 CLINICAL DATA:  Initial evaluation for abdominal pain and bloating, hematuria. EXAM: TRANSABDOMINAL AND TRANSVAGINAL ULTRASOUND OF PELVIS TECHNIQUE: Both transabdominal and transvaginal ultrasound examinations of the pelvis were performed. Transabdominal technique was performed for global imaging of the pelvis including uterus, ovaries, adnexal regions, and pelvic cul-de-sac. It was necessary to proceed with endovaginal exam following the transabdominal exam to visualize the uterus and ovaries. COMPARISON:  None FINDINGS: Uterus Measurements: 8.8 x 4.1 x 5.5 cm. There is an echogenic mass measuring 2.5 x 1.7 x 1.7 cm positioned at the mid - lower aspect of the uterus. This is favored to be submucosal in nature. Endometrium Thickness: 13 mm.  No focal abnormality visualized. Right ovary Measurements: 3.4 x 1.9 x 2.6 cm. Normal appearance/no adnexal mass. Two adjacent cysts versus a single cyst with single internal thin septation present within the right ovary, measuring the 2 2.4 x 1.4 x 1.5 cm. Left ovary Not visualized. No mass lesion or other abnormality seen within the left adnexa. Other findings No abnormal free fluid. IMPRESSION: 1. 2.5 x 1.7 x 1.7 cm mass positioned at the mid - lower aspect of the uterus. A submucosal fibroid is favored. Further evaluation with dedicated sonohysterography is suggested, as this may be helpful for further delineation. Additionally, pelvic MRI would also likely be helpful for further characterization as well. 2. Right ovarian cysts as above, most consistent with normal physiologic  cysts. 3. Nonvisualization of the left ovary. Electronically Signed   By: Jeannine Boga M.D.   On: 01/22/2017 13:49    Procedures Procedures (including critical care time)  Medications Ordered in ED Medications  meclizine (ANTIVERT) tablet 25 mg (25 mg Oral Given 01/23/17 1403)     Initial Impression / Assessment and Plan / ED Course  I have reviewed the triage vital signs and the nursing notes.  Pertinent labs & imaging results that were available during my care of the patient were reviewed by me and considered in my medical decision making (see chart for details).  Clinical Course as of Jan 23 1500  Sat Jan 23, 2017  1231 Pt seen and evaluated. C/o dizziness/room-spinning this AM, acute onset with standing associated with N/V. Feeling improved now. Denies specific positions which incite  dizziness. No recent URI complaints. Will await basic labs, check orthostatics.  [BS]  1237 No leukocytosis. No anemia. WBC: 6.7 [BS]  1244 Low total protein, normal albumin. CMP/lipase otherwise unremarkable. Total Protein: (!) 5.8 [BS]  1308 UA looks contaminated. No clinical indications of infection. Squamous Epithelial / LPF: (!) TOO NUMEROUS TO COUNT [BS]  1318 BP soft, awaiting orthostatics. Will likely give IVF. No report of poor PO intake recently. BP: (!) 89/58 [BS]  1355 Not orthostatic. Will give Meclizine BP: 99/61 [BS]  1456 Pt feeling much improved. No further dizziness. Will discharge with PCP f/u.  [BS]    Clinical Course User Index [BS] Holley Raring, MD    Pt presents with acute onset vertigo and N/V. Resolved by the time of presentation to ED. Lab w/u unremarkable. BP soft but not orthostatic. Received meclizine and IVF. Feeling better and wants to go home. Return precautions given. Pt is stable for DC.  Final Clinical Impressions(s) / ED Diagnoses   Final diagnoses:  Dizziness    New Prescriptions New Prescriptions   No medications on file     Holley Raring,  MD 01/23/17 [place], MD 02/02/17 1322

## 2017-01-25 LAB — CYTOLOGY - PAP
Diagnosis: NEGATIVE
HPV: NOT DETECTED

## 2017-01-27 DIAGNOSIS — R3915 Urgency of urination: Secondary | ICD-10-CM | POA: Diagnosis not present

## 2017-01-27 DIAGNOSIS — D250 Submucous leiomyoma of uterus: Secondary | ICD-10-CM | POA: Diagnosis not present

## 2017-01-27 DIAGNOSIS — R140 Abdominal distension (gaseous): Secondary | ICD-10-CM | POA: Diagnosis not present

## 2017-01-29 ENCOUNTER — Other Ambulatory Visit: Payer: 59

## 2017-01-29 ENCOUNTER — Ambulatory Visit
Admission: RE | Admit: 2017-01-29 | Discharge: 2017-01-29 | Disposition: A | Discharge status: Home / Self Care | Payer: 59 | Source: Ambulatory Visit | Attending: Physician Assistant | Admitting: Physician Assistant

## 2017-01-29 DIAGNOSIS — R1013 Epigastric pain: Secondary | ICD-10-CM

## 2017-01-29 DIAGNOSIS — R109 Unspecified abdominal pain: Secondary | ICD-10-CM | POA: Diagnosis not present

## 2017-01-29 NOTE — Progress Notes (Signed)
Pt aware of lab results & voiced understanding of those results.

## 2017-02-01 NOTE — Progress Notes (Signed)
Pt aware of lab results & voiced understanding of those results.

## 2017-03-19 ENCOUNTER — Ambulatory Visit: Payer: 59 | Admitting: Gastroenterology

## 2017-07-30 ENCOUNTER — Encounter: Payer: Self-pay | Admitting: Physician Assistant

## 2017-11-04 ENCOUNTER — Other Ambulatory Visit: Payer: Self-pay | Admitting: Physician Assistant

## 2017-11-04 DIAGNOSIS — Z1231 Encounter for screening mammogram for malignant neoplasm of breast: Secondary | ICD-10-CM

## 2017-12-22 ENCOUNTER — Ambulatory Visit: Payer: Self-pay

## 2017-12-31 ENCOUNTER — Other Ambulatory Visit: Payer: Self-pay | Admitting: Obstetrics and Gynecology

## 2017-12-31 DIAGNOSIS — R928 Other abnormal and inconclusive findings on diagnostic imaging of breast: Secondary | ICD-10-CM

## 2018-01-04 ENCOUNTER — Other Ambulatory Visit: Payer: Self-pay

## 2018-01-05 ENCOUNTER — Ambulatory Visit
Admission: RE | Admit: 2018-01-05 | Discharge: 2018-01-05 | Disposition: A | Discharge status: Home / Self Care | Payer: PRIVATE HEALTH INSURANCE | Source: Ambulatory Visit | Attending: Obstetrics and Gynecology | Admitting: Obstetrics and Gynecology

## 2018-01-05 ENCOUNTER — Ambulatory Visit: Payer: Self-pay

## 2018-01-05 DIAGNOSIS — R928 Other abnormal and inconclusive findings on diagnostic imaging of breast: Secondary | ICD-10-CM

## 2018-03-23 IMAGING — CR DG CHEST 2V
2 series · 2 of 2 positions shown · non-contrast
Comparison: Chest x-ray report July 14, 1998.

CLINICAL DATA: Cough, recently discontinued smoking after 11 year
history of same.

EXAM:
CHEST  2 VIEW

[chest pa]
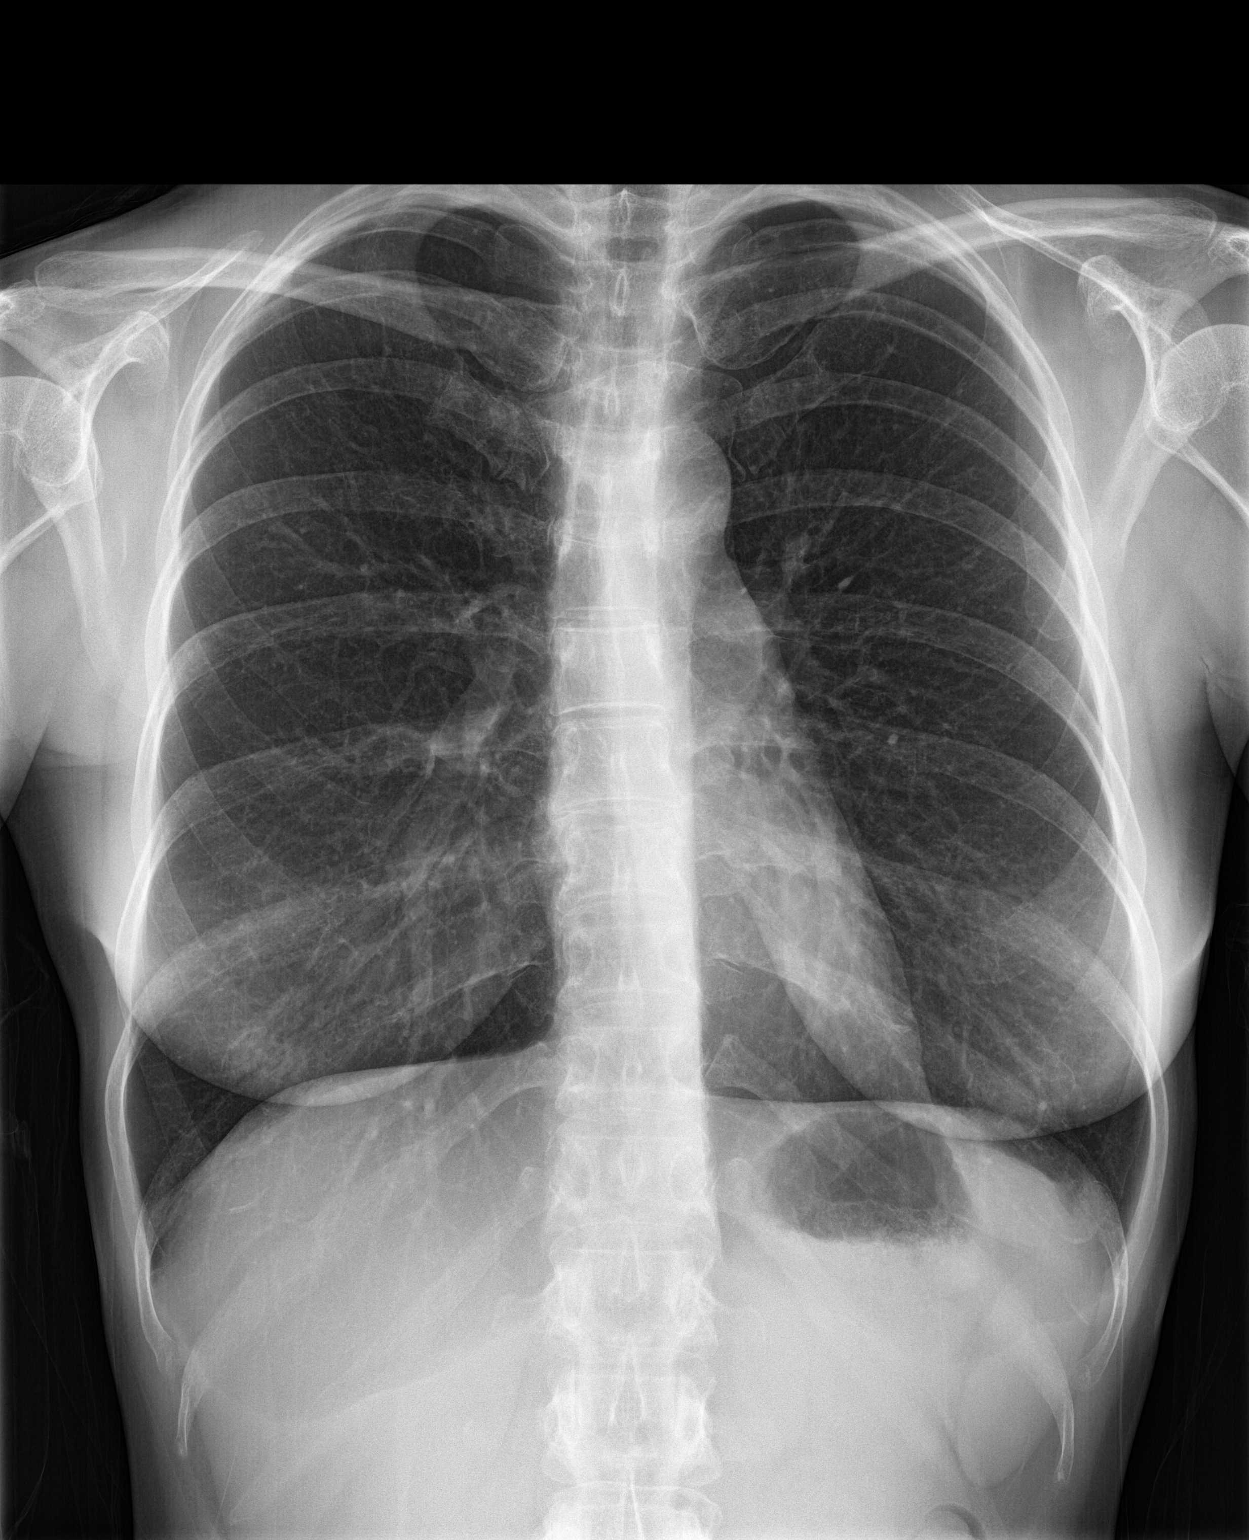

[chest lat]
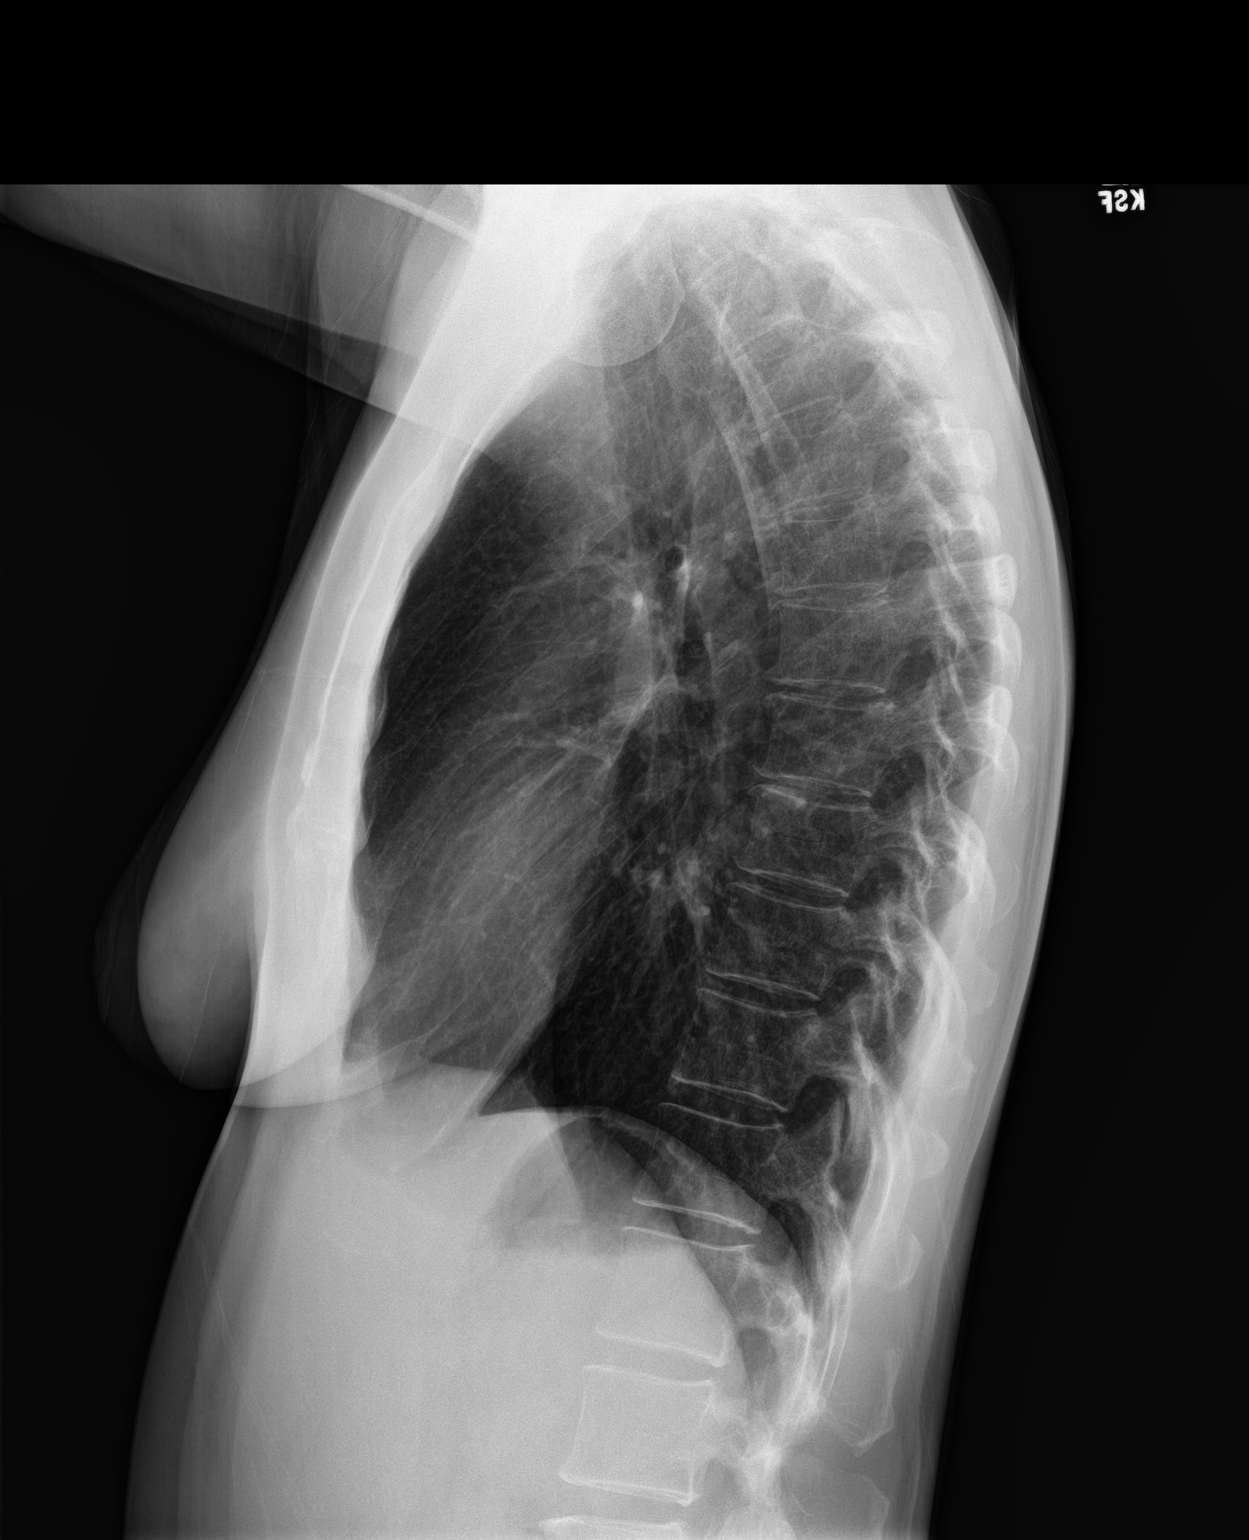

[2 of 2 positions shown; findings below may reference images not displayed]

FINDINGS: The lungs are hyperinflated with mild hemidiaphragm flattening.
There is no focal infiltrate. No pulmonary parenchymal nodules or
masses are observed. The heart and pulmonary vascularity are normal.
There is faint calcification in the wall of the aortic arch. There
is no pleural effusion or pneumothorax. The bony thorax is
unremarkable.
IMPRESSION: Hyperinflation consistent with COPD. No pneumonia, CHF, nor other
acute cardiopulmonary abnormality.

Aortic atherosclerosis.

## 2018-05-31 ENCOUNTER — Ambulatory Visit: Payer: 59 | Admitting: Family Medicine

## 2018-05-31 ENCOUNTER — Encounter: Payer: Self-pay | Admitting: Family Medicine

## 2018-05-31 VITALS — BP 98/78 | HR 74 | Temp 98.5°F | Ht 63.0 in | Wt 140.0 lb

## 2018-05-31 DIAGNOSIS — G8929 Other chronic pain: Secondary | ICD-10-CM | POA: Diagnosis not present

## 2018-05-31 DIAGNOSIS — Z7689 Persons encountering health services in other specified circumstances: Secondary | ICD-10-CM

## 2018-05-31 DIAGNOSIS — Z8719 Personal history of other diseases of the digestive system: Secondary | ICD-10-CM

## 2018-05-31 DIAGNOSIS — K5900 Constipation, unspecified: Secondary | ICD-10-CM

## 2018-05-31 DIAGNOSIS — M545 Low back pain, unspecified: Secondary | ICD-10-CM

## 2018-05-31 MED ORDER — HYDROCORTISONE ACETATE 25 MG RE SUPP
25.0000 mg | Freq: Two times a day (BID) | RECTAL | 0 refills | Status: AC
Start: 2018-05-31 — End: 2018-06-05

## 2018-05-31 NOTE — Progress Notes (Signed)
Patient presents to clinic today to f/u on chronic issues and establish care.  SUBJECTIVE: PMH: Pt is a 47 yo female with pmh sig for constipation, hemorrhoids, arthritis, genital herpes, GERD, COPD, vitamin deficiencies.  Constipation: -Patient endorses ongoing history -was seen by GI in the past but did not return after slipping on ice outside the office and breaking her elbow.  [facility]. -states miralax dose not work for her.  Linzess caused diarrhea. -having issues with hemorrhoids 2/2 straining with BMs -denies blood in stools, dizziness, n/v  Arthritis: -pt states she was told she has arthritis in her spine. -pt states she has low back and neck pain from bending over at work. -pt states she needs something for pain.   Tylenol does not help. -previously on Tramadol  Social history: Patient is single.  She is currently employed as a Charity fundraiser at [employer] Maintenance: Mammogram --2019 PAP --2019   Past Medical History:  Diagnosis Date  . ANEMIA-IRON DEFICIENCY 05/30/2007  . Anxiety   . Aphthous stomatitis   . Arthritis   . Depression   . Genital herpes   . GERD (gastroesophageal reflux disease)   . Migraines     History reviewed. No pertinent surgical history.  No current outpatient medications on file prior to visit.   No current facility-administered medications on file prior to visit.     Allergies  Allergen Reactions  . Diflucan [Fluconazole]     Rash Lip swelling    Family History  Problem Relation Age of Onset  . Fibromyalgia Mother   . Hypertension Mother   . Hypertension Father   . Arthritis Brother        rheumatoid  . Cancer Paternal Uncle   . Heart disease Maternal Grandfather   . Breast cancer Cousin     Social History   Socioeconomic History  . Marital status: Divorced    Spouse name: Not on file  . Number of children: Not on file  . Years of education: Not on file  . Highest  education level: Not on file  Occupational History  . Not on file  Social Needs  . Financial resource strain: Not on file  . Food insecurity:    Worry: Not on file    Inability: Not on file  . Transportation needs:    Medical: Not on file    Non-medical: Not on file  Tobacco Use  . Smoking status: Former Smoker    Packs/day: 1.50    Years: 15.00    Pack years: 22.50    Types: Cigarettes    Last attempt to quit: 05/24/2016    Years since quitting: 2.0  . Smokeless tobacco: Never Used  Substance and Sexual Activity  . Alcohol use: No  . Drug use: No  . Sexual activity: Not on file  Lifestyle  . Physical activity:    Days per week: Not on file    Minutes per session: Not on file  . Stress: Not on file  Relationships  . Social connections:    Talks on phone: Not on file    Gets together: Not on file    Attends religious service: Not on file    Active member of club or organization: Not on file    Attends meetings of clubs or organizations: Not on file    Relationship status: Not on file  . Intimate partner violence:    Fear of current or ex partner: Not  on file    Emotionally abused: Not on file    Physically abused: Not on file    Forced sexual activity: Not on file  Other Topics Concern  . Not on file  Social History Narrative  . Not on file    ROS General: Denies fever, chills, night sweats, changes in weight, changes in appetite HEENT: Denies headaches, ear pain, changes in vision, rhinorrhea, sore throat CV: Denies CP, palpitations, SOB, orthopnea Pulm: Denies SOB, cough, wheezing GI: Denies abdominal pain, nausea, vomiting, diarrhea   + constipation, hemorrhoids   GU: Denies dysuria, hematuria, frequency, vaginal discharge Msk: Denies muscle cramps, joint pains  +h/o of back and neck pain Neuro: Denies weakness, numbness, tingling Skin: Denies rashes, bruising Psych: Denies depression, anxiety, hallucinations   BP 98/78 (BP Location: Left Arm, Patient  Position: Sitting, Cuff Size: Normal)   Pulse 74   Temp 98.5 F (36.9 C) (Oral)   Ht 5' 3"  (1.6 m)   Wt 140 lb (63.5 kg)   LMP 04/28/2018 (Exact Date)   SpO2 98%   BMI 24.80 kg/m   Physical Exam Gen. Pleasant, well developed, well-nourished, in NAD HEENT - [place]/AT, PERRL, no scleral icterus, no nasal drainage, pharynx without erythema or exudate. Lungs: no use of accessory muscles, CTAB, no wheezes, rales or rhonchi Cardiovascular: RRR, No r/g/m, no peripheral edema Abdomen: BS present, soft, nontender, nondistended Neuro:  A&Ox3, CN II-XII intact, normal gait Skin:  Warm, dry, intact, no lesions  Recent Results (from the past 2160 hour(s))  CBC with Differential/Platelet     Status: None   Collection Time: 06/01/18 12:21 PM  Result Value Ref Range   WBC 5.9 3.8 - 10.8 Thousand/uL   RBC 4.16 3.80 - 5.10 Million/uL   Hemoglobin 12.3 11.7 - 15.5 g/dL   HCT 36.7 35.0 - 45.0 %   MCV 88.2 80.0 - 100.0 fL   MCH 29.6 27.0 - 33.0 pg   MCHC 33.5 32.0 - 36.0 g/dL   RDW 12.0 11.0 - 15.0 %   Platelets 221 140 - 400 Thousand/uL   MPV 11.2 7.5 - 12.5 fL   Neutro Abs 3,770 1,500 - 7,800 cells/uL   Lymphs Abs 1,634 850 - 3,900 cells/uL   WBC mixed population 413 200 - 950 cells/uL   Eosinophils Absolute 53 15 - 500 cells/uL   Basophils Absolute 30 0 - 200 cells/uL   Neutrophils Relative % 63.9 %   Total Lymphocyte 27.7 %   Monocytes Relative 7.0 %   Eosinophils Relative 0.9 %   Basophils Relative 0.5 %  COMPLETE METABOLIC PANEL WITH GFR     Status: None   Collection Time: 06/01/18 12:21 PM  Result Value Ref Range   Glucose, Bld 91 65 - 99 mg/dL    Comment: .            Fasting reference interval .    BUN 12 7 - 25 mg/dL   Creat 0.59 0.50 - 1.10 mg/dL   GFR, Est Non African American 110 > OR = 60 mL/min/1.1m   GFR, Est African American 127 > OR = 60 mL/min/1.712m  BUN/Creatinine Ratio NOT APPLICABLE 6 - 22 (calc)   Sodium 142 135 - 146 mmol/L   Potassium 4.5 3.5 - 5.3 mmol/L    Chloride 104 98 - 110 mmol/L   CO2 28 20 - 32 mmol/L   Calcium 9.6 8.6 - 10.2 mg/dL   Total Protein 7.3 6.1 - 8.1 g/dL   Albumin 4.5 3.6 -  5.1 g/dL   Globulin 2.8 1.9 - 3.7 g/dL (calc)   AG Ratio 1.6 1.0 - 2.5 (calc)   Total Bilirubin 0.3 0.2 - 1.2 mg/dL   Alkaline phosphatase (APISO) 54 33 - 115 U/L   AST 17 10 - 35 U/L   ALT 11 6 - 29 U/L  TSH     Status: None   Collection Time: 06/01/18 12:21 PM  Result Value Ref Range   TSH 2.47 mIU/L    Comment:           Reference Range .           > or = 20 Years  0.40-4.50 .                Pregnancy Ranges           First trimester    0.26-2.66           Second trimester   0.55-2.73           Third trimester    0.43-2.91   Magnesium     Status: None   Collection Time: 06/01/18 12:21 PM  Result Value Ref Range   Magnesium 2.1 1.5 - 2.5 mg/dL  Celiac Disease Comprehensive Panel with Reflexes     Status: None   Collection Time: 06/01/18 12:21 PM  Result Value Ref Range   INTERPRETATION      Comment: . No serological evidence of celiac disease. Marland Kitchen tTG IgA may normalize in individuals with celiac disease who maintain a gluten-free diet. Consider HLA DQ2 and  DQ8 testing to rule out celiac disease. Celiac disease  is extremely rare in the absence of DQ2 or DQ8. . . Test code [phone] will be discontinued on 07/11/18 to be  consistent with AAFP and ACG guidelines. A new test code, [phone], will be available to order on 04/11/18 to include Tissue Transglutaminase (tTG) IgA with Reflex to Endomysial Screen/Titer reflex, and Total IgA with reflex to Deamidated Gliadin Peptide (DGP) IgG and Tissue Transglutaminase (tTG) IgG to be consistent with  said guidelines. Test code [phone] will be orderable up to and including 07/10/18, thereafter it will not be  orderable. .    (tTG) Ab, IgA 1 U/mL    Comment: .        Value      Interpretation        -----      --------------        <4         No Antibody Detected        > or = 4   Antibody  Detected .    Immunoglobulin A 99 47 - 310 mg/dL    Assessment/Plan: Constipation, unspecified constipation type  -pt given handout -encouraged to make diet changes such as increasing fiber, increasing po intake of water. - Plan: Ambulatory referral to Gastroenterology  History of hemorrhoids  - Plan: Ambulatory referral to Gastroenterology, hydrocortisone (ANUSOL-HC) 25 MG suppository  Chronic low back pain without sciatica, unspecified back pain laterality -Discussed ways to relieve back pain including better posture, water aerobics, physical therapy, yoga -Pt advised to use Tylenol and heat  Encounter to establish care -We reviewed the PMH, PSH, FH, SH, Meds and Allergies. -We provided refills for any medications we will prescribe as needed. -We addressed current concerns per orders and patient instructions. -We have asked for records for pertinent exams, studies, vaccines and notes from previous providers. -We have advised patient to follow up per instructions  below.  F/u prn  Grier Mitts, MD

## 2018-05-31 NOTE — Patient Instructions (Signed)

## 2018-06-01 ENCOUNTER — Encounter: Payer: Self-pay | Admitting: Physician Assistant

## 2018-06-01 ENCOUNTER — Ambulatory Visit: Payer: 59 | Admitting: Physician Assistant

## 2018-06-01 VITALS — BP 139/83 | HR 76 | Temp 98.2°F | Resp 14 | Ht 63.0 in | Wt 140.0 lb

## 2018-06-01 DIAGNOSIS — M542 Cervicalgia: Secondary | ICD-10-CM

## 2018-06-01 DIAGNOSIS — R140 Abdominal distension (gaseous): Secondary | ICD-10-CM | POA: Diagnosis not present

## 2018-06-01 DIAGNOSIS — R5383 Other fatigue: Secondary | ICD-10-CM | POA: Diagnosis not present

## 2018-06-01 DIAGNOSIS — K602 Anal fissure, unspecified: Secondary | ICD-10-CM | POA: Diagnosis not present

## 2018-06-01 DIAGNOSIS — Z79899 Other long term (current) drug therapy: Secondary | ICD-10-CM

## 2018-06-01 MED ORDER — TRAMADOL HCL 50 MG PO TABS: ORAL_TABLET | ORAL | 0 refills | Status: DC

## 2018-06-01 MED ORDER — HYDROCORTISONE ACE-PRAMOXINE 2.5-1 % RE CREA: 1.0000 "application " | TOPICAL_CREAM | Freq: Three times a day (TID) | RECTAL | 2 refills | Status: DC

## 2018-06-01 MED ORDER — LIDOCAINE HCL URETHRAL/MUCOSAL 2 % EX GEL
CUTANEOUS | 0 refills | Status: DC
Start: 2018-06-01 — End: 2018-08-08

## 2018-06-01 NOTE — Progress Notes (Signed)
Subjective:    Patient ID: Carmen Hoffman, female    DOB: 07-27-1971, 47 y.o.   MRN: [id number]  HPI 47 y.o. WF presents with AB bloating x 1.5-2 years, states it is getting worse.   She is still working 3rd shift, will have some bloating with waking up, worse after eating or drinking anything. Has not done FODMAP, she has changed her diet, not eating after 6, increased water and fruit. She has gained weight. She has night and day sweats. She has constipation with the bloating. Last BM was Tuesday, hard to get out.  Miralax has not helped, she tried linzess 122mcg but she has diarrhea.   She has hemorrhoids, suppositories/prepartion H. She feels a "tear and will have pain after BM with itching/burning" She also states that it is hard to clean and has an odor. No blood in stool. She has 2 kids, vaginal delivery. Gets very full after meals, no nausea, GERD, vomiting. She has seen a GI doctor at [place]. See [facility], saw 01/31/2018.   She has seen a GI doctor at [place].   Brother has thyroid issues.  She has cold intolerance.  Lab Results  Component Value Date   TSH 2.49 01/21/2017  .  She has tramadol to take AS needed for her neck/back pain, uses it for double shifts. Last refill was 06/2016.   BMI is Body mass index is 24.8 kg/m., she is working on diet and exercise. Wt Readings from Last 3 Encounters:  06/01/18 140 lb (63.5 kg)  05/31/18 140 lb (63.5 kg)  01/23/17 130 lb (59 kg)     Review of Systems See HPI    Objective:   Physical Exam  Constitutional: She is oriented to person, place, and time. She appears well-developed and well-nourished.  HENT:  Head: Normocephalic and atraumatic.  Right Ear: External ear normal.  Left Ear: External ear normal.  Mouth/Throat: Oropharynx is clear and moist.  Eyes: Pupils are equal, round, and reactive to light. Conjunctivae and EOM are normal.  Neck: Normal range of motion. Neck supple. No thyromegaly present.   Cardiovascular: Normal rate, regular rhythm and normal heart sounds. Exam reveals no gallop and no friction rub.  No murmur heard. Pulmonary/Chest: Effort normal and breath sounds normal. No respiratory distress. She has no wheezes.  Abdominal: Soft. Bowel sounds are normal. She exhibits no distension and no mass. There is tenderness (bilateral lower quadratn, no rebound, no guardingg). There is no rebound and no guarding. No hernia.  Genitourinary: Vagina normal.  Genitourinary Comments: Rectum with anal fissure and 2 ulcers  Musculoskeletal: Normal range of motion.  Lymphadenopathy:    She has no cervical adenopathy.  Neurological: She is alert and oriented to person, place, and time. She displays normal reflexes. No cranial nerve deficit. Coordination normal.  Skin: Skin is warm and dry. No rash noted.  Psychiatric: She has a normal mood and affect.       Assessment & Plan:    Abdominal bloating -     Celiac Disease Comprehensive Panel with Reflexes - FODMAP diet discussed and given to patient Add magnesium Follow up GI  Other fatigue -     CBC with Differential/Platelet -     COMPLETE METABOLIC PANEL WITH GFR -     TSH  Medication management -     Magnesium  Anal fissure -     hydrocortisone-pramoxine (ANALPRAM-HC) 2.5-1 % rectal cream; Place 1 application rectally 3 (three) times daily. -  lidocaine (XYLOCAINE) 2 % jelly; apply small amount topically to rectum 2-3 times a day - need to help constipation, creams, + out break now too, get on valtrex from GYN - follow up GI  Neck pain on right side -     traMADol (ULTRAM) 50 MG tablet; 1 pill twice daily as needed for pain - for ACUTE pain

## 2018-06-01 NOTE — Patient Instructions (Signed)
Do the valcylovir and start the medication for an anal fissure  Get on allergy pill to try for your voice  Please pick one of the over the counter allergy medications below and take it once daily for allergies.  Claritin or loratadine cheapest but likely the weakest  Zyrtec or certizine at night because it can make you sleepy The strongest is allegra or fexafinadine  Cheapest at [employer], [employer], [employer]  GET ON MAGNESIUM PILLS FOR CONSTIPATION TO TRY TAKE IT AT NIGHT 500-1000MG    Anal Fissure, Adult An anal fissure is a small tear or crack in the skin around the anus. Bleeding from a fissure usually stops on its own within a few minutes. However, bleeding will often occur again with each bowel movement until the crack heals. CAUSES This condition may be caused by:  Passing large, hard stool (feces).  Frequent diarrhea.  Constipation.  Inflammatory bowel disease (Crohn disease or ulcerative colitis).  Infections.  Anal sex. SYMPTOMS Symptoms of this condition include:  Bleeding from the rectum.  Small amounts of blood seen on your stool, on toilet paper, or in the toilet after a bowel movement.  Painful bowel movements.  Itching or irritation around the anus. DIAGNOSIS A health care provider may diagnose this condition by closely examining the anal area. An anal fissure can usually be seen with careful inspection. In some cases, a rectal exam may be performed, or a short tube (anoscope) may be used to examine the anal canal. TREATMENT Treatment for this condition may include:  Taking steps to avoid constipation. This may include making changes to your diet, such as increasing your intake of fiber or fluid.  Taking fiber supplements. These supplements can soften your stool to help make bowel movements easier. Your health care provider may also prescribe a stool softener if your stool is often hard.  Taking sitz baths. This may help to heal the tear.  Using medicated  creams or ointments. These may be prescribed to lessen discomfort. HOME CARE INSTRUCTIONS Eating and Drinking  Avoid foods that may be constipating, such as bananas and dairy products.  Drink enough fluid to keep your urine clear or pale yellow.  Maintain a diet that is high in fruits, whole grains, and vegetables. General Instructions  Keep the anal area as clean and dry as possible.  Take sitz baths as told by your health care provider. Do not use soap in the sitz baths.  Take over-the-counter and prescription medicines only as told by your health care provider.  Use creams or ointments only as told by your health care provider.  Keep all follow-up visits as told by your health care provider. This is important. SEEK MEDICAL CARE IF:  You have more bleeding.  You have a fever.  You have diarrhea that is mixed with blood.  You continue to have pain.  Your problem is getting worse rather than better.   This information is not intended to replace advice given to you by your health care provider. Make sure you discuss any questions you have with your health care provider.   Document Released: 09/28/2005 Document Revised: 06/19/2015 Document Reviewed: 12/24/2014 Elsevier Interactive Patient Education [employer].    Please go to the ER if you have any severe AB pain, unable to hold down food/water, blood in stool or vomit, chest pain, shortness of breath, or any worsening symptoms.   Consider keeping a food diary- common causes of diarrhea are dairy, certain carbs...  FODMAP stands for  fermentable oligo-, di-, mono-saccharides and polyols (1). These are the scientific terms used to classify groups of carbs that are notorious for triggering digestive symptoms like bloating, gas and stomach pain.   FODMAPs are found in a wide range of foods in varying amounts. Some foods contain just one type, while others contain several.  The main dietary sources of the four  groups of FODMAPs include:  Oligosaccharides: Wheat, rye, legumes and various fruits and vegetables, such as garlic and onions.  Disaccharides: Milk, yogurt and soft cheese. Lactose is the main carb.  Monosaccharides: Various fruit including figs and mangoes, and sweeteners such as honey and agave nectar. Fructose is the main carb.  Polyols: Certain fruits and vegetables including blackberries and lychee, as well as some low-calorie sweeteners like those in sugar-free gum.   Keep a food diary. This will help you identify foods that cause symptoms. Write down: ? What you eat and when. ? What symptoms you have. ? When symptoms occur in relation to your meals.  Avoid foods that cause symptoms. Talk with your dietitian about other ways to get the same nutrients that are in these foods.  Eat your meals slowly, in a relaxed setting.  Aim to eat 5-6 small meals per day. Do not skip meals.  Drink enough fluids to keep your urine clear or pale yellow.  Ask your health care provider if you should take an over-the-counter probiotic during flare-ups to help restore healthy gut bacteria.  If you have cramping or diarrhea, try making your meals low in fat and high in carbohydrates. Examples of carbohydrates are pasta, rice, whole grain breads and cereals, fruits, and vegetables.  If dairy products cause your symptoms to flare up, try eating less of them. You might be able to handle yogurt better than other dairy products because it contains bacteria that help with digestion.

## 2018-06-02 ENCOUNTER — Encounter: Payer: Self-pay | Admitting: Family Medicine

## 2018-06-02 DIAGNOSIS — K5900 Constipation, unspecified: Secondary | ICD-10-CM | POA: Insufficient documentation

## 2018-06-02 DIAGNOSIS — Z8719 Personal history of other diseases of the digestive system: Secondary | ICD-10-CM

## 2018-06-02 HISTORY — DX: Personal history of other diseases of the digestive system: Z87.19

## 2018-06-02 LAB — COMPLETE METABOLIC PANEL WITH GFR
AG RATIO: 1.6 (calc) (ref 1.0–2.5)
ALBUMIN MSPROF: 4.5 g/dL (ref 3.6–5.1)
ALT: 11 U/L (ref 6–29)
AST: 17 U/L (ref 10–35)
Alkaline phosphatase (APISO): 54 U/L (ref 33–115)
BUN: 12 mg/dL (ref 7–25)
CALCIUM: 9.6 mg/dL (ref 8.6–10.2)
CO2: 28 mmol/L (ref 20–32)
CREATININE: 0.59 mg/dL (ref 0.50–1.10)
Chloride: 104 mmol/L (ref 98–110)
GFR, EST AFRICAN AMERICAN: 127 mL/min/{1.73_m2} (ref >=60)
GFR, EST NON AFRICAN AMERICAN: 110 mL/min/{1.73_m2} (ref >=60)
GLOBULIN: 2.8 g/dL (ref 1.9–3.7)
Glucose, Bld: 91 mg/dL (ref 65–99)
POTASSIUM: 4.5 mmol/L (ref 3.5–5.3)
SODIUM: 142 mmol/L (ref 135–146)
TOTAL PROTEIN: 7.3 g/dL (ref 6.1–8.1)
Total Bilirubin: 0.3 mg/dL (ref 0.2–1.2)

## 2018-06-02 LAB — CBC WITH DIFFERENTIAL/PLATELET
Basophils Absolute: 30 cells/uL (ref 0–200)
Basophils Relative: 0.5 %
EOS PCT: 0.9 %
Eosinophils Absolute: 53 cells/uL (ref 15–500)
HCT: 36.7 % (ref 35.0–45.0)
HEMOGLOBIN: 12.3 g/dL (ref 11.7–15.5)
LYMPHS ABS: 1634 {cells}/uL (ref 850–3900)
MCH: 29.6 pg (ref 27.0–33.0)
MCHC: 33.5 g/dL (ref 32.0–36.0)
MCV: 88.2 fL (ref 80.0–100.0)
MPV: 11.2 fL (ref 7.5–12.5)
Monocytes Relative: 7 %
NEUTROS ABS: 3770 {cells}/uL (ref 1500–7800)
NEUTROS PCT: 63.9 %
Platelets: 221 10*3/uL (ref 140–400)
RBC: 4.16 10*6/uL (ref 3.80–5.10)
RDW: 12 % (ref 11.0–15.0)
Total Lymphocyte: 27.7 %
WBC: 5.9 10*3/uL (ref 3.8–10.8)
WBCMIX: 413 {cells}/uL (ref 200–950)

## 2018-06-02 LAB — CELIAC DISEASE COMPREHENSIVE PANEL WITH REFLEXES
(tTG) Ab, IgA: 1 U/mL
IMMUNOGLOBULIN A: 99 mg/dL (ref 47–310)

## 2018-06-02 LAB — MAGNESIUM: Magnesium: 2.1 mg/dL (ref 1.5–2.5)

## 2018-06-02 LAB — TSH: TSH: 2.47 mIU/L

## 2018-08-02 ENCOUNTER — Encounter: Payer: Self-pay | Admitting: Family Medicine

## 2018-08-04 NOTE — Progress Notes (Signed)
   Subjective:    Patient ID: Carmen Hoffman, female    DOB: Aug 17, 1971, 47 y.o.   MRN: [id number]  HPI 47 y.o. WF presents for follow up for AB bloating x 1.5-2 years and rectal pain.   Suppose to follow up with GI. Had a negative celiac. 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 She was told to start FODMAP and add magnesium and states that she is doing much better with the AB bloating.   She now complains of right leg pain, right calf will have sharp pain, having cramping occ bilateral legs, she has bilateral lower back pain. Wears compression stocks at work.  No recent tick exposure, she is drinking plenty of fluids.  She has tramadol to take AS needed for her neck/back pain, uses it for double shifts. Last refill was 06/2016.   BMI is Body mass index is 24.41 kg/m., she is working on diet and exercise. Wt Readings from Last 3 Encounters:  08/08/18 137 lb 12.8 oz (62.5 kg)  06/01/18 140 lb (63.5 kg)  05/31/18 140 lb (63.5 kg)     Review of Systems See HPI    Objective:   Physical Exam  Constitutional: She is oriented to person, place, and time. She appears well-developed and well-nourished.  HENT:  Head: Normocephalic and atraumatic.  Right Ear: External ear normal.  Left Ear: External ear normal.  Mouth/Throat: Oropharynx is clear and moist.  Eyes: Pupils are equal, round, and reactive to light. Conjunctivae and EOM are normal.  Neck: Normal range of motion. Neck supple. No thyromegaly present.  Cardiovascular: Normal rate, regular rhythm and normal heart sounds. Exam reveals no gallop and no friction rub.  No murmur heard. Pulmonary/Chest: Effort normal and breath sounds normal. No respiratory distress. She has no wheezes.  Abdominal: Soft. Bowel sounds are normal. She exhibits no distension and no mass. There is no tenderness. There is no rebound and no guarding. No hernia.  Genitourinary: Vagina normal.  Musculoskeletal:  Normal range of motion.  Patient is able to ambulate well. Gait is not  Antalgic. Straight leg raising with dorsiflexion negative bilaterally for radicular symptoms. Sensory exam in the legs are normal. Knee reflexes are normal Ankle reflexes are normal Strength is normal and symmetric in arms and legs. There is SI tenderness to palpation.  There is paraspinal muscle spasm.  There is not midline tenderness.  ROM of spine with  limited in flexion due to pain. Full ROM bilateral hips, + pain at greater trochanter.  + superfical veins bilateral legs, no warmth, redness, or hard cord, neg homen's   Lymphadenopathy:    She has no cervical adenopathy.  Neurological: She is alert and oriented to person, place, and time. She displays normal reflexes. No cranial nerve deficit. Coordination normal.  Skin: Skin is warm and dry. No rash noted.  Psychiatric: She has a normal mood and affect.       Assessment & Plan:    Bilateral leg pain/back pain Negative straight leg Increase mag 500mg  mobic x 2 weeks, stretches given If not better can refer to ortho or try injection here Schedule for CPE  Future Appointments  Date Time Provider [place]  02/07/2019  9:00 AM Vicie Mutters, PA-C [id number] None

## 2018-08-08 ENCOUNTER — Ambulatory Visit: Payer: 59 | Admitting: Physician Assistant

## 2018-08-08 ENCOUNTER — Encounter: Payer: Self-pay | Admitting: Physician Assistant

## 2018-08-08 VITALS — BP 102/66 | HR 69 | Temp 97.9°F | Resp 12 | Ht 63.0 in | Wt 137.8 lb

## 2018-08-08 DIAGNOSIS — M791 Myalgia, unspecified site: Secondary | ICD-10-CM | POA: Diagnosis not present

## 2018-08-08 DIAGNOSIS — R140 Abdominal distension (gaseous): Secondary | ICD-10-CM

## 2018-08-08 MED ORDER — MELOXICAM 15 MG PO TABS: ORAL_TABLET | ORAL | 1 refills | Status: DC

## 2018-08-08 NOTE — Patient Instructions (Addendum)
Mobic is an antiinflammatory It helps pain, can not take with aleve, or ibuprofen You can take tylenol (500mg ) or tylenol arthritis (650mg ) with the meloxicam/antiinflammatories. The max you can take of tylenol a day is 3000mg  daily, this is a max of 6 pills a day of the regular tyelnol (500mg ) or a max of 4 a day of the tylenol arthritis (650mg ) as long as no other medications you are taking contain tylenol.   Mobic can cause inflammation in your stomach and can cause ulcers or bleeding, this will look like black tarry stools Make sure you take your mobic with food Try not to take it daily, take AS needed Can take with zantac  Bursitis Bursitis is inflammation and irritation of a bursa, which is one of the small, fluid-filled sacs that cushion and protect the moving parts of your body. These sacs are located between bones and muscles, muscle attachments, or skin areas next to bones. A bursa protects these structures from the wear and tear that results from frequent movement. An inflamed bursa causes pain and swelling. Fluid may build up inside the sac. Bursitis is most common near joints, especially the knees, elbows, hips, and shoulders. What are the causes? Bursitis can be caused by:  Injury from: ? A direct blow, like falling on your knee or elbow. ? Overuse of a joint (repetitive stress).  Infection. This can happen if bacteria gets into a bursa through a cut or scrape near a joint.  Diseases that cause joint inflammation, such as gout and rheumatoid arthritis.  What increases the risk? You may be at risk for bursitis if you:  Have a job or hobby that involves a lot of repetitive stress on your joints.  Have a condition that weakens your body's defense system (immune system), such as diabetes, cancer, or HIV.  Lift and reach overhead often.  Kneel or lean on hard surfaces often.  Run or walk often.  What are the signs or symptoms? The most common signs and symptoms of  bursitis are:  Pain that gets worse when you move the affected body part or put weight on it.  Inflammation.  Stiffness.  Other signs and symptoms may include:  Redness.  Tenderness.  Warmth.  Pain that continues after rest.  Fever and chills. This may occur in bursitis caused by infection.  How is this diagnosed? Bursitis may be diagnosed by:  Medical history and physical exam.  MRI.  A procedure to drain fluid from the bursa with a needle (aspiration). The fluid may be checked for signs of infection or gout.  Blood tests to rule out other causes of inflammation.  How is this treated? Bursitis can usually be treated at home with rest, ice, compression, and elevation (RICE). For mild bursitis, RICE treatment may be all you need. Other treatments may include:  Nonsteroidal anti-inflammatory drugs (NSAIDs) to treat pain and inflammation.  Corticosteroids to fight inflammation. You may have these drugs injected into and around the area of bursitis.  Aspiration of bursitis fluid to relieve pain and improve movement.  Antibiotic medicine to treat an infected bursa.  A splint, brace, or walking aid.  Physical therapy if you continue to have pain or limited movement.  Surgery to remove a damaged or infected bursa. This may be needed if you have a very bad case of bursitis or if other treatments have not worked.  Follow these instructions at home:  Take medicines only as directed by your health care provider.  If  you were prescribed an antibiotic medicine, finish it all even if you start to feel better.  Rest the affected area as directed by your health care provider. ? Keep the area elevated. ? Avoid activities that make pain worse.  Apply ice to the injured area: ? Place ice in a plastic bag. ? Place a towel between your skin and the bag. ? Leave the ice on for 20 minutes, 2-3 times a day.  Use splints, braces, pads, or walking aids as directed by your health  care provider.  Keep all follow-up visits as directed by your health care provider. This is important. How is this prevented?  Wear knee pads if you kneel often.  Wear sturdy running or walking shoes that fit you well.  Take regular breaks from repetitive activity.  Warm up by stretching before doing any strenuous activity.  Maintain a healthy weight or lose weight as recommended by your health care provider. Ask your health care provider if you need help.  Exercise regularly. Start any new physical activity gradually. Contact a health care provider if:  Your bursitis is not responding to treatment or home care.  You have a fever.  You have chills. This information is not intended to replace advice given to you by your health care provider. Make sure you discuss any questions you have with your health care provider. Document Released: 09/25/2000 Document Revised: 03/05/2016 Document Reviewed: 12/18/2013 Elsevier Interactive Patient Education  2018 [employer].  Muscle aches  Please make sure you are taking 64 oz of water a day as long as you do now have a heart condition.   Muscle Pain Muscle pain (myalgia) may be caused by many things, including:  Overuse or muscle strain, especially if you are not in shape. This is the most common cause of muscle pain.  Injury.  Bruises.  Viruses, such as the flu.  Infectious diseases.  Fibromyalgia, which is a chronic condition that causes muscle tenderness, fatigue, and headache.  Autoimmune diseases, including lupus.  Certain drugs, including ACE inhibitors and statins. Muscle pain may be mild or severe. In most cases, the pain lasts only a short time and goes away without treatment. To diagnose the cause of your muscle pain, your health care provider will take your medical history. This means he or she will ask you when your muscle pain began and what has been happening. If you have not had muscle pain for very long, your  health care provider may want to wait before doing much testing. If your muscle pain has lasted a long time, your health care provider may want to run tests right away. If your health care provider thinks your muscle pain may be caused by illness, you may need to have additional tests to rule out certain conditions.  Treatment for muscle pain depends on the cause. Home care is often enough to relieve muscle pain. Your health care provider may also prescribe anti-inflammatory medicine. HOME CARE INSTRUCTIONS Watch your condition for any changes. The following actions may help to lessen any discomfort you are feeling:  Only take over-the-counter or prescription medicines as directed by your health care provider.  Apply ice to the sore muscle:  Put ice in a plastic bag.  Place a towel between your skin and the bag.  Leave the ice on for 15-20 minutes, 3-4 times a day.  You may alternate applying hot and cold packs to the muscle as directed by your health care provider.  If overuse is  causing your muscle pain, slow down your activities until the pain goes away.  Remember that it is normal to feel some muscle pain after starting a workout program. Muscles that have not been used often will be sore at first.  Do regular, gentle exercises if you are not usually active.  Warm up before exercising to lower your risk of muscle pain.  Do not continue working out if the pain is very bad. Bad pain could mean you have injured a muscle. SEEK MEDICAL CARE IF:  Your muscle pain gets worse, and medicines do not help.  You have muscle pain that lasts longer than 3 days.  You have a rash or fever along with muscle pain.  You have muscle pain after a tick bite.  You have muscle pain while working out, even though you are in good physical condition.  You have redness, soreness, or swelling along with muscle pain.  You have muscle pain after starting a new medicine or changing the dose of a  medicine. SEEK IMMEDIATE MEDICAL CARE IF:  You have trouble breathing.  You have trouble swallowing.  You have muscle pain along with a stiff neck, fever, and vomiting.  You have severe muscle weakness or cannot move part of your body. MAKE SURE YOU:   Understand these instructions.  Will watch your condition.  Will get help right away if you are not doing well or get worse. Document Released: 08/20/2006 Document Revised: 10/03/2013 Document Reviewed: 07/25/2013 [facility] Patient Information 2015 [place], [place]. This information is not intended to replace advice given to you by your health care provider. Make sure you discuss any questions you have with your health care provider.  Please go to the ER if you have any severe AB pain, unable to hold down food/water, blood in stool or vomit, chest pain, shortness of breath, or any worsening symptoms.   Consider keeping a food diary- common causes of diarrhea are dairy, certain carbs...  FODMAP stands for fermentable oligo-, di-, mono-saccharides and polyols (1). These are the scientific terms used to classify groups of carbs that are notorious for triggering digestive symptoms like bloating, gas and stomach pain.   FODMAPs are found in a wide range of foods in varying amounts. Some foods contain just one type, while others contain several.  The main dietary sources of the four groups of FODMAPs include:  Oligosaccharides: Wheat, rye, legumes and various fruits and vegetables, such as garlic and onions.  Disaccharides: Milk, yogurt and soft cheese. Lactose is the main carb.  Monosaccharides: Various fruit including figs and mangoes, and sweeteners such as honey and agave nectar. Fructose is the main carb.  Polyols: Certain fruits and vegetables including blackberries and lychee, as well as some low-calorie sweeteners like those in sugar-free gum.   Keep a food diary. This will help you identify foods that cause symptoms. Write  down: ? What you eat and when. ? What symptoms you have. ? When symptoms occur in relation to your meals.  Avoid foods that cause symptoms. Talk with your dietitian about other ways to get the same nutrients that are in these foods.  Eat your meals slowly, in a relaxed setting.  Aim to eat 5-6 small meals per day. Do not skip meals.  Drink enough fluids to keep your urine clear or pale yellow.  Ask your health care provider if you should take an over-the-counter probiotic during flare-ups to help restore healthy gut bacteria.  If you have cramping or diarrhea, try  making your meals low in fat and high in carbohydrates. Examples of carbohydrates are pasta, rice, whole grain breads and cereals, fruits, and vegetables.  If dairy products cause your symptoms to flare up, try eating less of them. You might be able to handle yogurt better than other dairy products because it contains bacteria that help with digestion.

## 2018-11-07 DIAGNOSIS — H5213 Myopia, bilateral: Secondary | ICD-10-CM | POA: Diagnosis not present

## 2018-11-07 DIAGNOSIS — H52203 Unspecified astigmatism, bilateral: Secondary | ICD-10-CM | POA: Diagnosis not present

## 2018-11-07 DIAGNOSIS — H524 Presbyopia: Secondary | ICD-10-CM | POA: Diagnosis not present

## 2018-11-08 MED FILL — VALACYCLOVIR HCL 500 MG TAB: 500 | 15 days supply | Qty: 30 | Fill #0

## 2018-11-10 DIAGNOSIS — A609 Anogenital herpesviral infection, unspecified: Secondary | ICD-10-CM | POA: Diagnosis not present

## 2018-11-10 DIAGNOSIS — N898 Other specified noninflammatory disorders of vagina: Secondary | ICD-10-CM | POA: Diagnosis not present

## 2018-12-05 ENCOUNTER — Telehealth: Payer: Self-pay | Admitting: Gastroenterology

## 2018-12-05 NOTE — Telephone Encounter (Signed)
Hi Dr. Ardis Hughs, this patient saw you in 2015 and transferred her care to [facility] due to insurance coverage. She changed insurance and we are now in network so she would like to transfer her care back to you. Is it ok to schedule her for an appt with you? She is having some stomach issues. Please advise. Thank you.

## 2018-12-05 NOTE — Telephone Encounter (Signed)
I would be happy to see her back.

## 2018-12-05 NOTE — Telephone Encounter (Signed)
Consult scheduled on 3/25 at 10:30am.

## 2018-12-06 MED FILL — VALACYCLOVIR HCL 500 MG TAB: 500 | 45 days supply | Qty: 90 | Fill #0

## 2018-12-07 ENCOUNTER — Other Ambulatory Visit: Payer: Self-pay | Admitting: Obstetrics and Gynecology

## 2018-12-07 DIAGNOSIS — Z1231 Encounter for screening mammogram for malignant neoplasm of breast: Secondary | ICD-10-CM

## 2018-12-28 DIAGNOSIS — L814 Other melanin hyperpigmentation: Secondary | ICD-10-CM | POA: Diagnosis not present

## 2018-12-28 DIAGNOSIS — L720 Epidermal cyst: Secondary | ICD-10-CM | POA: Diagnosis not present

## 2018-12-28 DIAGNOSIS — D1801 Hemangioma of skin and subcutaneous tissue: Secondary | ICD-10-CM | POA: Diagnosis not present

## 2018-12-28 DIAGNOSIS — D225 Melanocytic nevi of trunk: Secondary | ICD-10-CM | POA: Diagnosis not present

## 2019-01-04 ENCOUNTER — Telehealth (INDEPENDENT_AMBULATORY_CARE_PROVIDER_SITE_OTHER): Payer: 59 | Admitting: Gastroenterology

## 2019-01-04 ENCOUNTER — Other Ambulatory Visit: Payer: Self-pay

## 2019-01-04 DIAGNOSIS — K5900 Constipation, unspecified: Secondary | ICD-10-CM | POA: Diagnosis not present

## 2019-01-04 DIAGNOSIS — R140 Abdominal distension (gaseous): Secondary | ICD-10-CM | POA: Diagnosis not present

## 2019-01-04 NOTE — Progress Notes (Signed)
This service was provided via telemedicine.  The patient was located at home.  I was located in my office.  The patient did consent to this telephone visit and is aware of possible charges through their insurance for this visit.  I last saw her about 5 years ago.  nobody else participated in this telemedicine service however my certified medical assistant  helped with follow-up recommendations.  Time spent on call: 18 min   HPI: This is a pleasant 48 year old woman whom I last saw about 5 years ago for some globus work-up.   She was seen by a gastroenterologist through the [facility] January 2019 for chronic constipation.  He recommended a battery of test; labs April 2019 show essentially normal CBC, essentially normal complete metabolic profile  Blood work August 2019 visible through epic medical record system shows celiac sprue serologies negative, normal CBC, normal complete metabolic profile.  I last saw her about 5 years ago for globus sensation.  Also rare intermittent solid food dysphasia.  She had had an ENT evaluation including a barium esophagram about 2 years prior to the 2015 office visit with me and those tests were normal.  EGD November 2015, Dr. Ardis Hughs was normal.  I recommended that she try over-the-counter proton pump inhibitor if she began having swallowing difficulties again.  Bloating.  This occurs daily.  Can be uncomfortable.  No nausea, vomiting  She's been gaining weight lately, probably 10 pounds in 6 months.  Constipation "really bad".  She has has BM once per week.  Has to push, strain every time  She changed her diet, tried linzess (helped at first, 127mcg pills, took daily for a few months). She has tried miralx; took one dose daily for a while.  Has hemorrhoids, these bleed less than once per month.  She prescription pain meds except tramadol.  She's never had a colonoscopy.  Chief complaint is constipation, bloating,  hemorrhoid  ROS: complete GI ROS as described in HPI, all other review negative.  Constitutional:  No unintentional weight loss   Past Medical History:  Diagnosis Date  . ANEMIA-IRON DEFICIENCY 05/30/2007  . Anxiety   . Aphthous stomatitis   . Arthritis   . Depression   . Genital herpes   . GERD (gastroesophageal reflux disease)   . Migraines     No past surgical history on file.  Current Outpatient Medications  Medication Sig Dispense Refill  . hydrocortisone-pramoxine (ANALPRAM-HC) 2.5-1 % rectal cream Place 1 application rectally 3 (three) times daily. 30 g 2  . meloxicam (MOBIC) 15 MG tablet Take one daily with food for 2 weeks, can take with tylenol, can not take with aleve, iburpofen, then as needed daily for pain 30 tablet 1  . traMADol (ULTRAM) 50 MG tablet 1 pill twice daily as needed for pain 60 tablet 0   No current facility-administered medications for this visit.     Allergies as of 01/04/2019 - Review Complete 06/02/2018  Allergen Reaction Noted  . Diflucan [fluconazole]  01/01/2013    Family History  Problem Relation Age of Onset  . Fibromyalgia Mother   . Hypertension Mother   . Hypertension Father   . Arthritis Brother        rheumatoid  . Cancer Paternal Uncle   . Heart disease Maternal Grandfather   . Breast cancer Cousin     Social History   Socioeconomic History  . Marital status: Divorced    Spouse name: Not on file  .  Number of children: Not on file  . Years of education: Not on file  . Highest education level: Not on file  Occupational History  . Not on file  Social Needs  . Financial resource strain: Not on file  . Food insecurity:    Worry: Not on file    Inability: Not on file  . Transportation needs:    Medical: Not on file    Non-medical: Not on file  Tobacco Use  . Smoking status: Former Smoker    Packs/day: 1.50    Years: 15.00    Pack years: 22.50    Types: Cigarettes    Last attempt to quit: 05/24/2016    Years  since quitting: 2.6  . Smokeless tobacco: Never Used  Substance and Sexual Activity  . Alcohol use: No  . Drug use: No  . Sexual activity: Not on file  Lifestyle  . Physical activity:    Days per week: Not on file    Minutes per session: Not on file  . Stress: Not on file  Relationships  . Social connections:    Talks on phone: Not on file    Gets together: Not on file    Attends religious service: Not on file    Active member of club or organization: Not on file    Attends meetings of clubs or organizations: Not on file    Relationship status: Not on file  . Intimate partner violence:    Fear of current or ex partner: Not on file    Emotionally abused: Not on file    Physically abused: Not on file    Forced sexual activity: Not on file  Other Topics Concern  . Not on file  Social History Narrative  . Not on file     Physical Exam: Unable to perform because this was a "telemed visit" due to current Covid-19 pandemic  Assessment and plan: 48 y.o. female with constipation, bloating, perceived hemorrhoids  I suspect that her biggest issue is the constipation and because of that she has bloating and trouble with hemorrhoids.  I recommend today that she start taking a fiber supplement on a daily basis and also that she retry taking MiraLAX 1 dose every day on a daily basis as well.  I think she will probably need a colonoscopy at some point but given the ongoing coronavirus pandemic now is not the correct time.  I have a low suspicion that she has anything very serious going on.  We will arrange for a new GI patient office visit with me in about 2 months from now.  I will hear how she has done on the above recommendations and make a decision at that point if it is safe to proceed with colonoscopy.  She will call the office in 4 to 5 weeks to report on her response to my medication recommendations described above.  Please see the "Patient Instructions" section for addition  details about the plan.  Owens Loffler, MD [facility] 01/04/2019, 10:19 AM

## 2019-01-04 NOTE — Patient Instructions (Addendum)
Please start taking citrucel (orange flavored) powder fiber supplement.  This may cause some bloating at first but that usually goes away. Begin with a small spoonful and work your way up to a large, heaping spoonful daily over a week.  One dose of miralax every single day  Call in 4-5 weeks   Please return to see Dr. Ardis Hughs in 2-3 months.  Thank you for entrusting me with your care and choosing [facility].  Dr Ardis Hughs

## 2019-01-17 ENCOUNTER — Ambulatory Visit: Payer: 59

## 2019-02-06 ENCOUNTER — Encounter: Payer: Self-pay | Admitting: Physician Assistant

## 2019-02-06 NOTE — Progress Notes (Signed)
Complete Physical  Assessment and Plan:  Atherosclerosis of aorta (HCC) Control blood pressure, cholesterol, glucose, increase exercise.  -     EKG 12-Lead  Chronic obstructive pulmonary disease, unspecified COPD type (HCC) No triggers, well controlled symptoms, cont to monitor  Iron deficiency anemia, unspecified iron deficiency anemia type -     Iron,Total/Total Iron Binding Cap - monitor, continue iron supp with Vitamin C and increase green leafy veggies  Depression, major, recurrent, in partial remission (HCC) -     TSH - continue medications, stress management techniques discussed, increase water, good sleep hygiene discussed, increase exercise, and increase veggies.   B12 deficiency -     Vitamin B12 - get on B12 supplement  Vitamin D deficiency -     VITAMIN D 25 Hydroxy (Vit-D Deficiency, Fractures)  Occipital neuralgia of left side Monitor  Constipation, unspecified constipation type Following up with Dr Ardis Hughs, do miralax daily, start on it  History of hemorrhoids Follow up Dr. Ardis Hughs  Genital herpes simplex, unspecified site Monitor  Gastroesophageal reflux disease with esophagitis Continue PPI/H2 blocker, diet discussed  Encounter for general adult medical examination with abnormal findings 1 year  Medication management -     CBC with Differential/Platelet -     COMPLETE METABOLIC PANEL WITH GFR -     Magnesium  Screening cholesterol level -     Lipid panel  Screening for hematuria or proteinuria -     Urinalysis, Routine w reflex microscopic -     Microalbumin / creatinine urine ratio  Discussed med's effects and SE's. Screening labs and tests as requested with regular follow-up as recommended. Over 40 minutes of exam, counseling, chart review and critical decision making was performed  HPI  This very nice 48 y.o.female presents for complete physical.    Her blood pressure has been controlled at home, today their BP is BP: 116/74  She does  workout, she is walking. She denies chest pain, shortness of breath, dizziness.  She is not on cholesterol medication and denies myalgias. Her cholesterol is at goal. The cholesterol last visit was:   Lab Results  Component Value Date   CHOL 166 06/17/2016   HDL 51 06/17/2016   LDLCALC 94 06/17/2016   TRIG 107 06/17/2016   CHOLHDL 3.3 06/17/2016   Last A1C in the office was:  Lab Results  Component Value Date   HGBA1C 4.9 06/17/2016   Patient is on Vitamin D supplement, she is not on a vitamin d.   Lab Results  Component Value Date   VD25OH 26 (L) 01/04/2017     Was on B12 injections in the past, she is not on anything at this time.  Lab Results  Component Value Date   [id number] 485 01/04/2017    Current Medications:  Current Outpatient Medications on File Prior to Visit  Medication Sig Dispense Refill  . traMADol (ULTRAM) 50 MG tablet 1 pill twice daily as needed for pain 60 tablet 0   No current facility-administered medications on file prior to visit.    Health Maintenance:   Immunization History  Administered Date(s) Administered  . Influenza Split 07/18/2012  . Influenza-Unspecified 07/11/2013   TD/TDAP: will get this visit Influenza: gets at work 2019 Pneumovax: N/A Prevnar 13: N/A  LMP: Patient's last menstrual period was 01/16/2019. Pap:  Has GYN, Joycelyn Rua MGM: 12/2017 reschedule next month  Xray cervial 03/2013 Colonoscopy Dr. Ardis Hughs EGD 2015  Allergies:  Allergies  Allergen Reactions  . Diflucan [Fluconazole]  Rash Lip swelling   Medical History:  has Iron deficiency anemia; ANXIETY; Depression, major, recurrent, in partial remission ([place]); GERD (gastroesophageal reflux disease); Genital herpes; B12 deficiency; Vitamin D deficiency; Occipital neuralgia; Neck pain on right side; Headache, tension-type; Tobacco abuse disorder; COPD (chronic obstructive pulmonary disease) ([place]); Atherosclerosis of aorta ([place]); Constipation; and History of  hemorrhoids on their problem list. Surgical History:  She  has no past surgical history on file. Family History:  Her family history includes Arthritis in her brother; Breast cancer in her cousin; Cancer in her paternal uncle; Fibromyalgia in her mother; Heart disease in her maternal grandfather; Hypertension in her father and mother. Social History:   reports that she quit smoking about 2 years ago. Her smoking use included cigarettes. She has a 22.50 pack-year smoking history. She has never used smokeless tobacco. She reports that she does not drink alcohol or use drugs.  Review of Systems: Review of Systems  Constitutional: Negative for chills, diaphoresis, fever, malaise/fatigue and weight loss.  HENT: Negative for congestion, ear discharge, ear pain, hearing loss, nosebleeds, sore throat and tinnitus.   Eyes: Negative.  Negative for blurred vision and double vision.  Respiratory: Negative.  Negative for stridor.   Cardiovascular: Negative.  Negative for chest pain.  Gastrointestinal: Negative.   Genitourinary: Negative.   Musculoskeletal: Positive for neck pain. Negative for back pain, falls, joint pain and myalgias.  Skin: Negative.   Neurological: Negative.  Negative for weakness and headaches.  Endo/Heme/Allergies: Negative.   Psychiatric/Behavioral: Negative.     Physical Exam: Estimated body mass index is 25.33 kg/m as calculated from the following:   Height as of this encounter: 5\' 3"  (1.6 m).   Weight as of this encounter: 143 lb (64.9 kg). BP 116/74   Pulse 60   Temp 97.9 F (36.6 C)   Ht 5\' 3"  (1.6 m)   Wt 143 lb (64.9 kg)   LMP 01/16/2019   SpO2 97%   BMI 25.33 kg/m  General Appearance: Well nourished, in no apparent distress.  Eyes: PERRLA, EOMs, conjunctiva no swelling or erythema, normal fundi and vessels.  Sinuses: No Frontal/maxillary tenderness  ENT/Mouth: Ext aud canals clear, normal light reflex with TMs without erythema, bulging but + bilateral  effusions, + TMJ right worse than left. Good dentition without abscesses. No erythema, swelling, or exudate on post pharynx. Tonsils not swollen or erythematous. Hearing normal.  Neck: Supple, thyroid normal. No bruits  Respiratory: Respiratory effort normal, BS equal bilaterally without rales, rhonchi, wheezing or stridor.  Cardio: RRR without murmurs, rubs or gallops. Brisk peripheral pulses without edema.  Chest: symmetric, with normal excursions and percussion.  Breasts:defer  Abdomen: Soft, nontender, no guarding, rebound, hernias, masses, or organomegaly.  Lymphatics: Non tender without lymphadenopathy.  Genitourinary: defer Musculoskeletal: Full ROM all peripheral extremities,5/5 strength, and normal gait. Very tender right SCM/traps, no radicular symptoms.  Skin: Warm, dry without rashes, lesions, ecchymosis. Neuro: Cranial nerves intact, reflexes equal bilaterally. Normal muscle tone, no cerebellar symptoms. Sensation intact.  Psych: Awake and oriented X 3, normal affect, Insight and Judgment appropriate.   EKG: WNL, no ST changes  Vicie Mutters 9:07 AM [facility]

## 2019-02-07 ENCOUNTER — Ambulatory Visit: Payer: 59 | Admitting: Physician Assistant

## 2019-02-07 ENCOUNTER — Encounter: Payer: Self-pay | Admitting: Physician Assistant

## 2019-02-07 ENCOUNTER — Other Ambulatory Visit: Payer: Self-pay

## 2019-02-07 VITALS — BP 116/74 | HR 60 | Temp 97.9°F | Ht 63.0 in | Wt 143.0 lb

## 2019-02-07 DIAGNOSIS — Z79899 Other long term (current) drug therapy: Secondary | ICD-10-CM | POA: Diagnosis not present

## 2019-02-07 DIAGNOSIS — Z13220 Encounter for screening for lipoid disorders: Secondary | ICD-10-CM

## 2019-02-07 DIAGNOSIS — F3341 Major depressive disorder, recurrent, in partial remission: Secondary | ICD-10-CM

## 2019-02-07 DIAGNOSIS — I10 Essential (primary) hypertension: Secondary | ICD-10-CM | POA: Diagnosis not present

## 2019-02-07 DIAGNOSIS — Z1389 Encounter for screening for other disorder: Secondary | ICD-10-CM

## 2019-02-07 DIAGNOSIS — D509 Iron deficiency anemia, unspecified: Secondary | ICD-10-CM

## 2019-02-07 DIAGNOSIS — Z136 Encounter for screening for cardiovascular disorders: Secondary | ICD-10-CM | POA: Diagnosis not present

## 2019-02-07 DIAGNOSIS — Z720 Tobacco use: Secondary | ICD-10-CM

## 2019-02-07 DIAGNOSIS — Z0001 Encounter for general adult medical examination with abnormal findings: Secondary | ICD-10-CM

## 2019-02-07 DIAGNOSIS — E538 Deficiency of other specified B group vitamins: Secondary | ICD-10-CM | POA: Diagnosis not present

## 2019-02-07 DIAGNOSIS — E559 Vitamin D deficiency, unspecified: Secondary | ICD-10-CM | POA: Diagnosis not present

## 2019-02-07 DIAGNOSIS — E039 Hypothyroidism, unspecified: Secondary | ICD-10-CM

## 2019-02-07 DIAGNOSIS — A6000 Herpesviral infection of urogenital system, unspecified: Secondary | ICD-10-CM

## 2019-02-07 DIAGNOSIS — Z0000 Encounter for general adult medical examination without abnormal findings: Secondary | ICD-10-CM

## 2019-02-07 DIAGNOSIS — M5481 Occipital neuralgia: Secondary | ICD-10-CM

## 2019-02-07 DIAGNOSIS — K2100 Gastro-esophageal reflux disease with esophagitis, without bleeding: Secondary | ICD-10-CM

## 2019-02-07 DIAGNOSIS — Z8719 Personal history of other diseases of the digestive system: Secondary | ICD-10-CM

## 2019-02-07 DIAGNOSIS — J449 Chronic obstructive pulmonary disease, unspecified: Secondary | ICD-10-CM

## 2019-02-07 DIAGNOSIS — I700 Atherosclerosis of aorta: Secondary | ICD-10-CM

## 2019-02-07 DIAGNOSIS — K5900 Constipation, unspecified: Secondary | ICD-10-CM

## 2019-02-07 NOTE — Patient Instructions (Addendum)
VITAMIN D IS IMPORTANT  Vitamin D goal is between 60-80  Please make sure that you are taking your Vitamin D as directed.   It is very important as a natural anti-inflammatory   helping hair, skin, and nails, as well as reducing stroke and heart attack risk.   It helps your bones and helps with mood.  We want you on at least 5000 IU daily  It also decreases numerous cancer risks so please take it as directed.   Low Vit D is associated with a 200-300% higher risk for CANCER   and 200-300% higher risk for HEART   ATTACK  &  STROKE.    .....................................Marland Kitchen  It is also associated with higher death rate at younger ages,   autoimmune diseases like Rheumatoid arthritis, Lupus, Multiple Sclerosis.     Also many other serious conditions, like depression, Alzheimer's  Dementia, infertility, muscle aches, fatigue, fibromyalgia - just to name a few.  +++++++++++++++++++  Can get liquid vitamin D from [facility] here in [place] at  [facility] alternatives [address], [place], [place] [address] Or you can try earth fare    VENOUS INSUFFICIENCY Our lower leg venous system is not the most reliable, the heart does NOT pump fluid up, there is a valve system.  The muscles of the leg squeeze and the blood moves up and a valve opens and close, then they squeeze, blood moves up and valves open and closes keeping the blood moving towards the heart.  Lots can go wrong with this valve system.  If someone is sitting or standing without movement, everyone will get swelling.  THINGS TO DO:  Do not stand or sit in one position for long periods of time. Do not sit with your legs crossed. Rest with your legs raised during the day.  Your legs have to be higher than your heart so that gravity will force the valves to open, so please really elevate your legs.   Wear elastic stockings or support hose. Do not wear other tight, encircling garments around the legs, pelvis, or  waist.  [employer]  has a wide variety of well priced compression stockings. [place], [place] [id number] [id number] [place] has a good cheap selection, I like the socks, they are not as hard to get on  Walk as much as possible to increase blood flow.  Raise the foot of your bed at night with 2-inch blocks.  SEEK MEDICAL CARE IF:   The skin around your ankle starts to break down.  You have pain, redness, tenderness, or hard swelling developing in your leg over a vein.  You are uncomfortable due to leg pain.  If you ever have shortness of breath with exertion or chest pain go to the ER.      When it comes to diets, agreement about the perfect plan isn't easy to find, even among the experts. Experts at the [employer] developed an idea known as the Healthy Eating Plate. Just imagine a plate divided into logical, healthy portions.  The emphasis is on diet quality:  Load up on vegetables and fruits - one-half of your plate: Aim for color and variety, and remember that potatoes don't count.  Go for whole grains - one-quarter of your plate: Whole wheat, barley, wheat berries, quinoa, oats, brown rice, and foods made with them. If you want pasta, go with whole wheat pasta.  Protein power - one-quarter of your  plate: Fish, chicken, beans, and nuts are all healthy, versatile protein sources. Limit red meat.  The diet, however, does go beyond the plate, offering a few other suggestions.  Use healthy plant oils, such as olive, canola, soy, corn, sunflower and peanut. Check the labels, and avoid partially hydrogenated oil, which have unhealthy trans fats.  If you're thirsty, drink water. Coffee and tea are good in moderation, but skip sugary drinks and limit milk and dairy products to one or two daily servings.  The type of carbohydrate in the diet is more important than the amount. Some sources of carbohydrates, such as vegetables, fruits,  whole grains, and beans-are healthier than others.  Finally, stay active.

## 2019-02-08 LAB — CBC WITH DIFFERENTIAL/PLATELET
Absolute Monocytes: 489 cells/uL (ref 200–950)
Basophils Absolute: 40 cells/uL (ref 0–200)
Basophils Relative: 0.6 %
Eosinophils Absolute: 80 cells/uL (ref 15–500)
Eosinophils Relative: 1.2 %
HCT: 37.2 % (ref 35.0–45.0)
Hemoglobin: 12.7 g/dL (ref 11.7–15.5)
Lymphs Abs: 2298 cells/uL (ref 850–3900)
MCH: 30.7 pg (ref 27.0–33.0)
MCHC: 34.1 g/dL (ref 32.0–36.0)
MCV: 89.9 fL (ref 80.0–100.0)
MPV: 11.2 fL (ref 7.5–12.5)
Monocytes Relative: 7.3 %
Neutro Abs: 3792 cells/uL (ref 1500–7800)
Neutrophils Relative %: 56.6 %
Platelets: 201 10*3/uL (ref 140–400)
RBC: 4.14 10*6/uL (ref 3.80–5.10)
RDW: 12.4 % (ref 11.0–15.0)
Total Lymphocyte: 34.3 %
WBC: 6.7 10*3/uL (ref 3.8–10.8)

## 2019-02-08 LAB — LIPID PANEL
Cholesterol: 159 mg/dL (ref <200)
HDL: 49 mg/dL — ABNORMAL LOW (ref >=50)
LDL Cholesterol (Calc): 90 mg/dL (calc)
Non-HDL Cholesterol (Calc): 110 mg/dL (calc) (ref <130)
Total CHOL/HDL Ratio: 3.2 (calc) (ref <5.0)
Triglycerides: 103 mg/dL (ref <150)

## 2019-02-08 LAB — COMPLETE METABOLIC PANEL WITH GFR
AG Ratio: 1.7 (calc) (ref 1.0–2.5)
ALT: 10 U/L (ref 6–29)
AST: 17 U/L (ref 10–35)
Albumin: 4.5 g/dL (ref 3.6–5.1)
Alkaline phosphatase (APISO): 46 U/L (ref 31–125)
BUN: 14 mg/dL (ref 7–25)
CO2: 29 mmol/L (ref 20–32)
Calcium: 9.4 mg/dL (ref 8.6–10.2)
Chloride: 101 mmol/L (ref 98–110)
Creat: 0.57 mg/dL (ref 0.50–1.10)
GFR, Est African American: 128 mL/min/{1.73_m2} (ref >=60)
GFR, Est Non African American: 110 mL/min/{1.73_m2} (ref >=60)
Globulin: 2.7 g/dL (calc) (ref 1.9–3.7)
Glucose, Bld: 103 mg/dL — ABNORMAL HIGH (ref 65–99)
Potassium: 3.7 mmol/L (ref 3.5–5.3)
Sodium: 136 mmol/L (ref 135–146)
Total Bilirubin: 0.3 mg/dL (ref 0.2–1.2)
Total Protein: 7.2 g/dL (ref 6.1–8.1)

## 2019-02-08 LAB — MAGNESIUM: Magnesium: 1.9 mg/dL (ref 1.5–2.5)

## 2019-02-08 LAB — URINALYSIS, ROUTINE W REFLEX MICROSCOPIC
Bilirubin Urine: NEGATIVE
Glucose, UA: NEGATIVE
Hgb urine dipstick: NEGATIVE
Ketones, ur: NEGATIVE
Leukocytes,Ua: NEGATIVE
Nitrite: NEGATIVE
Protein, ur: NEGATIVE
Specific Gravity, Urine: 1.032 (ref 1.001–1.03)
pH: <=5 (ref 5.0–8.0)

## 2019-02-08 LAB — MICROALBUMIN / CREATININE URINE RATIO
Creatinine, Urine: 222 mg/dL (ref 20–275)
Microalb Creat Ratio: 5 mcg/mg creat (ref <30)
Microalb, Ur: 1.1 mg/dL

## 2019-02-08 LAB — IRON, TOTAL/TOTAL IRON BINDING CAP
Iron: 45 ug/dL (ref 40–190)
TIBC: 326 mcg/dL (calc) (ref 250–450)

## 2019-02-08 LAB — VITAMIN D 25 HYDROXY (VIT D DEFICIENCY, FRACTURES): Vit D, 25-Hydroxy: 26 ng/mL — ABNORMAL LOW (ref 30–100)

## 2019-02-08 LAB — TSH: TSH: 6.24 mIU/L — ABNORMAL HIGH

## 2019-02-08 LAB — VITAMIN B12: Vitamin B-12: 497 pg/mL (ref 200–1100)

## 2019-02-08 LAB — IRON,?TOTAL/TOTAL IRON BINDING CAP: %SAT: 14 % (calc) — ABNORMAL LOW (ref 16–45)

## 2019-02-08 MED ORDER — LEVOTHYROXINE SODIUM 50 MCG PO TABS: 50.0000 ug | ORAL_TABLET | Freq: Every day | ORAL | 3 refills | Status: DC

## 2019-02-08 NOTE — Addendum Note (Signed)
Addended by: Vladimir Crofts on: 02/08/2019 08:48 AM   Modules accepted: Orders

## 2019-03-07 ENCOUNTER — Ambulatory Visit: Payer: 59

## 2019-03-13 MED FILL — LEVOTHYROXINE 50 MCG TABLET: 50 | 30 days supply | Qty: 30 | Fill #0

## 2019-03-13 NOTE — Progress Notes (Addendum)
Assessment and Plan:  Iron deficiency anemia, unspecified iron deficiency anemia type -     Iron,Total/Total Iron Binding Cap  Depression, major, recurrent, in partial remission (HCC) Denies depression Check labs Increase exercise  B12 deficiency -     Vitamin B12  Hypothyroidism, unspecified type -     TSH -     T4, Free - continue thyroid medication will check levels  Medication management -     CBC with Differential/Platelet -     COMPLETE METABOLIC PANEL WITH GFR  Bilateral leg pain Patient with family history of RA, will rule out autoimmune No history of rashes or psoriasis, patient with HLA b27 and non specific ANA, with family history and non specific symptoms will refer to rheumtology ? From back but normal exam- will monitor -     C-reactive protein -     Aldolase -     ANA -     Anti-DNA antibody, double-stranded -     Rheumatoid factor -     B. burgdorfi antibodies -     Ehrlichia antibody panel -     RNP Antibody -     HLA-B27 antigen -     Anti-Smith antibody      HPI 48 y.o.female presents for 1 MONTH FOLLOW UP.   Last visit she was started on thyroid medication, B12, iron, she states that she is more tired not wanting to even walk her dog once a day, her legs are sore and weak with lower back pain.  She states her legs are worse after sitting for a long time, will take 1 hour in the AM to help with the stiffness/weakness. Brother with RA.   States she does not sleep a lot, will go until she "passes out" has had fatigue in the AM with waking which is new.   No rashes, no double vision, blurry vision. Denies depression, anxiety. No snoring.   She is on thyroid medication, 50 mcg daily.  Lab Results  Component Value Date   TSH 6.24 (H) 02/07/2019  .  Lab Results  Component Value Date   [id number] 098 02/07/2019   She is on iron, vitamin C once a day.  Lab Results  Component Value Date   IRON 45 02/07/2019   TIBC 326 02/07/2019    FERRITIN 73 01/04/2017     Past Medical History:  Diagnosis Date  . ANEMIA-IRON DEFICIENCY 05/30/2007  . Anxiety   . Aphthous stomatitis   . Arthritis   . Depression   . Genital herpes   . GERD (gastroesophageal reflux disease)   . Migraines   . Throat pain 07/26/2008   2015 s/p ENT eval       Allergies  Allergen Reactions  . Diflucan [Fluconazole]     Rash Lip swelling    Current Outpatient Medications on File Prior to Visit  Medication Sig  . levothyroxine (SYNTHROID) 50 MCG tablet Take 1 tablet (50 mcg total) by mouth daily.  . traMADol (ULTRAM) 50 MG tablet 1 pill twice daily as needed for pain   No current facility-administered medications on file prior to visit.     ROS: all negative except above.   Physical Exam: Filed Weights   03/15/19 0938  Weight: 140 lb (63.5 kg)   BP 118/72   Pulse 86   Temp 97.9 F (36.6 C)   Ht _0  (1.575 m)   Wt 140 lb (63.5 kg)   LMP 03/12/2019   SpO2 97%  BMI 25.61 kg/m  General Appearance: Well nourished, in no apparent distress. Eyes: PERRLA, EOMs, conjunctiva no swelling or erythema Sinuses: No Frontal/maxillary tenderness ENT/Mouth: Ext aud canals clear, TMs without erythema, bulging. No erythema, swelling, or exudate on post pharynx.  Tonsils not swollen or erythematous. Hearing normal.  Neck: Supple, thyroid normal.  Respiratory: Respiratory effort normal, BS equal bilaterally without rales, rhonchi, wheezing or stridor.  Cardio: RRR with no MRGs. Brisk peripheral pulses without edema.  Abdomen: Soft, + BS.  Non tender, no guarding, rebound, hernias, masses. Lymphatics: Non tender without lymphadenopathy.  Musculoskeletal: Full ROM, 5/5 strength, normal gait.  Skin: Warm, dry without rashes, lesions, ecchymosis.  Neuro: Cranial nerves intact. Normal muscle tone, no cerebellar symptoms. Sensation intact.  Psych: Awake and oriented X 3, normal affect, Insight and Judgment appropriate.     Vicie Mutters,  PA-C 9:45 AM [facility]

## 2019-03-14 ENCOUNTER — Encounter: Payer: Self-pay | Admitting: General Surgery

## 2019-03-15 ENCOUNTER — Ambulatory Visit (INDEPENDENT_AMBULATORY_CARE_PROVIDER_SITE_OTHER): Payer: 59 | Admitting: Gastroenterology

## 2019-03-15 ENCOUNTER — Encounter: Payer: Self-pay | Admitting: Gastroenterology

## 2019-03-15 ENCOUNTER — Ambulatory Visit: Payer: 59 | Admitting: Physician Assistant

## 2019-03-15 ENCOUNTER — Encounter: Payer: Self-pay | Admitting: Physician Assistant

## 2019-03-15 ENCOUNTER — Other Ambulatory Visit: Payer: Self-pay

## 2019-03-15 VITALS — BP 118/72 | HR 86 | Temp 97.9°F | Ht 62.0 in | Wt 140.0 lb

## 2019-03-15 VITALS — Ht 62.0 in | Wt 142.0 lb

## 2019-03-15 DIAGNOSIS — M79604 Pain in right leg: Secondary | ICD-10-CM | POA: Diagnosis not present

## 2019-03-15 DIAGNOSIS — E039 Hypothyroidism, unspecified: Secondary | ICD-10-CM

## 2019-03-15 DIAGNOSIS — F3341 Major depressive disorder, recurrent, in partial remission: Secondary | ICD-10-CM | POA: Diagnosis not present

## 2019-03-15 DIAGNOSIS — Z1589 Genetic susceptibility to other disease: Secondary | ICD-10-CM | POA: Diagnosis not present

## 2019-03-15 DIAGNOSIS — D485 Neoplasm of uncertain behavior of skin: Secondary | ICD-10-CM | POA: Diagnosis not present

## 2019-03-15 DIAGNOSIS — E538 Deficiency of other specified B group vitamins: Secondary | ICD-10-CM

## 2019-03-15 DIAGNOSIS — R140 Abdominal distension (gaseous): Secondary | ICD-10-CM

## 2019-03-15 DIAGNOSIS — D509 Iron deficiency anemia, unspecified: Secondary | ICD-10-CM

## 2019-03-15 DIAGNOSIS — R768 Other specified abnormal immunological findings in serum: Secondary | ICD-10-CM

## 2019-03-15 DIAGNOSIS — L723 Sebaceous cyst: Secondary | ICD-10-CM | POA: Diagnosis not present

## 2019-03-15 DIAGNOSIS — Z79899 Other long term (current) drug therapy: Secondary | ICD-10-CM | POA: Diagnosis not present

## 2019-03-15 DIAGNOSIS — M79605 Pain in left leg: Secondary | ICD-10-CM | POA: Diagnosis not present

## 2019-03-15 DIAGNOSIS — K5900 Constipation, unspecified: Secondary | ICD-10-CM | POA: Diagnosis not present

## 2019-03-15 DIAGNOSIS — M2550 Pain in unspecified joint: Secondary | ICD-10-CM

## 2019-03-15 DIAGNOSIS — R208 Other disturbances of skin sensation: Secondary | ICD-10-CM | POA: Diagnosis not present

## 2019-03-15 MED ORDER — PEG 3350-KCL-NA BICARB-NACL 420 G PO SOLR: 4000.0000 mL | ORAL | 0 refills | Status: DC

## 2019-03-15 NOTE — Progress Notes (Signed)
Review of pertinent gastrointestinal problems: 1. globus sensation 2015.  Also rare intermittent solid food dysphasia.  She had had an ENT evaluation including a barium esophagram about 2 years prior to the 2015 office visit with me and those tests were normal.  EGD November 2015, Dr. Ardis Hughs was normal.  I recommended that she try over-the-counter proton pump inhibitor if she began having swallowing difficulties again. 2. Constipation: She was seen by a gastroenterologist through the [facility] January 2019 for chronic constipation.  He recommended a battery of test; labs April 2019 show essentially normal CBC, essentially normal complete metabolic profile.  Telemedicine visit with me March 2020 I recommended daily fiber supplement and retry MiraLAX 1 dose every day.   This service was provided via virtual visit.  Both audio and visual were used. The patient was located in her car.  I was located in my office.  The patient did consent to this virtual visit and is aware of possible charges through their insurance for this visit.  The patient is an established patient.  My certified medical assistant, Grace Bushy, contributed to this visit by contacting the patient by phone 1 or 2 business days prior to the appointment and also followed up on the recommendations I made after the visit.  Time spent on virtual visit: 18 minutes  HPI: This is a very pleasant 48 year old woman whom I last saw via a telemedicine visit March 2020.  At that time I recommended that she start fiber supplements and also once daily MiraLAX.  She did try the MiraLAX but not the fiber.  MiraLAX made her bowels quite soft and a bit uncontrollable actually.  She kept with it for a couple months and then stopped last week.  She still has intermittent minor bright red rectal bleeding.   Blood work April 2020 show normal CBC, normal complete metabolic profile, TSH elevated at 6.2   Chief complaint is  constipation, rectal bleeding  ROS: complete GI ROS as described in HPI, all other review negative.  Constitutional:  No unintentional weight loss   Past Medical History:  Diagnosis Date  . ANEMIA-IRON DEFICIENCY 05/30/2007  . Anxiety   . Aphthous stomatitis   . Arthritis   . Depression   . Genital herpes   . GERD (gastroesophageal reflux disease)   . Migraines   . Throat pain 07/26/2008   2015 s/p ENT eval      No past surgical history on file.  Current Outpatient Medications  Medication Sig Dispense Refill  . levothyroxine (SYNTHROID) 50 MCG tablet Take 1 tablet (50 mcg total) by mouth daily. 30 tablet 3  . traMADol (ULTRAM) 50 MG tablet 1 pill twice daily as needed for pain 60 tablet 0   No current facility-administered medications for this visit.     Allergies as of 03/15/2019 - Review Complete 03/15/2019  Allergen Reaction Noted  . Diflucan [fluconazole]  01/01/2013    Family History  Problem Relation Age of Onset  . Fibromyalgia Mother   . Hypertension Mother   . Hypertension Father   . Arthritis Brother        rheumatoid  . Cancer Paternal Uncle   . Heart disease Maternal Grandfather   . Breast cancer Cousin   . Colon cancer Paternal Aunt     Social History   Socioeconomic History  . Marital status: Divorced    Spouse name: Not on file  . Number of children: Not on file  . Years of  education: Not on file  . Highest education level: Not on file  Occupational History  . Not on file  Social Needs  . Financial resource strain: Not on file  . Food insecurity:    Worry: Not on file    Inability: Not on file  . Transportation needs:    Medical: Not on file    Non-medical: Not on file  Tobacco Use  . Smoking status: Former Smoker    Packs/day: 1.50    Years: 15.00    Pack years: 22.50    Types: Cigarettes    Last attempt to quit: 05/24/2016    Years since quitting: 2.8  . Smokeless tobacco: Never Used  Substance and Sexual Activity  . Alcohol  use: No  . Drug use: No  . Sexual activity: Not on file  Lifestyle  . Physical activity:    Days per week: Not on file    Minutes per session: Not on file  . Stress: Not on file  Relationships  . Social connections:    Talks on phone: Not on file    Gets together: Not on file    Attends religious service: Not on file    Active member of club or organization: Not on file    Attends meetings of clubs or organizations: Not on file    Relationship status: Not on file  . Intimate partner violence:    Fear of current or ex partner: Not on file    Emotionally abused: Not on file    Physically abused: Not on file    Forced sexual activity: Not on file  Other Topics Concern  . Not on file  Social History Narrative  . Not on file     Physical Exam: Unable to perform because this was a "telemed visit" due to current Covid-19 pandemic  Assessment and plan: 48 y.o. female with constipation, rectal bleeding  I think her rectal bleeding is likely hemorrhoidal related.  Her constipation undoubtedly contributes.  She did not try the fiber supplement which I recommended 3 months ago but she will try it now.  She will stop the MiraLAX since really just soften her stools too much leading to some semi-incontinence issues.  I recommended we arrange a colonoscopy at her soonest convenience as well to exclude other significant potential causes.  I see no reason for any further blood tests or imaging studies prior to then.  Please see the "Patient Instructions" section for addition details about the plan.  Owens Loffler, MD [facility] 03/15/2019, 10:49 AM

## 2019-03-15 NOTE — Patient Instructions (Addendum)
She will start fiber supplements, over-the-counter powder Citrucel once daily for now.   We will arrange a colonoscopy at her soonest convenience for constipation, minor rectal bleeding.  Thank you for entrusting me with your care and choosing [facility].  Dr Ardis Hughs

## 2019-03-17 LAB — IRON, TOTAL/TOTAL IRON BINDING CAP
%SAT: 18 % (calc) (ref 16–45)
Iron: 56 ug/dL (ref 40–190)
TIBC: 312 mcg/dL (calc) (ref 250–450)

## 2019-03-17 LAB — CBC WITH DIFFERENTIAL/PLATELET
Absolute Monocytes: 422 cells/uL (ref 200–950)
Basophils Absolute: 32 cells/uL (ref 0–200)
Basophils Relative: 0.5 %
Eosinophils Absolute: 102 cells/uL (ref 15–500)
Eosinophils Relative: 1.6 %
HCT: 36.7 % (ref 35.0–45.0)
Hemoglobin: 12 g/dL (ref 11.7–15.5)
Lymphs Abs: 2234 cells/uL (ref 850–3900)
MCH: 29.4 pg (ref 27.0–33.0)
MCHC: 32.7 g/dL (ref 32.0–36.0)
MCV: 90 fL (ref 80.0–100.0)
MPV: 10.8 fL (ref 7.5–12.5)
Monocytes Relative: 6.6 %
Neutro Abs: 3610 cells/uL (ref 1500–7800)
Neutrophils Relative %: 56.4 %
Platelets: 214 10*3/uL (ref 140–400)
RBC: 4.08 10*6/uL (ref 3.80–5.10)
RDW: 11.8 % (ref 11.0–15.0)
Total Lymphocyte: 34.9 %
WBC: 6.4 10*3/uL (ref 3.8–10.8)

## 2019-03-17 LAB — B. BURGDORFI ANTIBODIES: B burgdorferi Ab IgG+IgM: <0.9 index

## 2019-03-17 LAB — T4, FREE: Free T4: 1.5 ng/dL (ref 0.8–1.8)

## 2019-03-17 LAB — ANTI-NUCLEAR AB-TITER (ANA TITER): ANA Titer 1: 1:40 {titer} — ABNORMAL HIGH

## 2019-03-17 LAB — COMPLETE METABOLIC PANEL WITH GFR
AG Ratio: 1.5 (calc) (ref 1.0–2.5)
ALT: 11 U/L (ref 6–29)
AST: 18 U/L (ref 10–35)
Albumin: 4.3 g/dL (ref 3.6–5.1)
Alkaline phosphatase (APISO): 48 U/L (ref 31–125)
BUN: 14 mg/dL (ref 7–25)
CO2: 30 mmol/L (ref 20–32)
Calcium: 9.4 mg/dL (ref 8.6–10.2)
Chloride: 100 mmol/L (ref 98–110)
Creat: 0.62 mg/dL (ref 0.50–1.10)
GFR, Est African American: 124 mL/min/{1.73_m2} (ref >=60)
GFR, Est Non African American: 107 mL/min/{1.73_m2} (ref >=60)
Globulin: 2.9 g/dL (calc) (ref 1.9–3.7)
Glucose, Bld: 86 mg/dL (ref 65–99)
Potassium: 3.5 mmol/L (ref 3.5–5.3)
Sodium: 138 mmol/L (ref 135–146)
Total Bilirubin: 0.3 mg/dL (ref 0.2–1.2)
Total Protein: 7.2 g/dL (ref 6.1–8.1)

## 2019-03-17 LAB — TSH: TSH: 0.23 mIU/L — ABNORMAL LOW

## 2019-03-17 LAB — EHRLICHIA ANTIBODY PANEL
E. CHAFFEENSIS AB IGG: <1:64 {titer}
E. CHAFFEENSIS AB IGM: <1:20 {titer}

## 2019-03-17 LAB — ANTI-DNA ANTIBODY, DOUBLE-STRANDED: ds DNA Ab: <1 IU/mL

## 2019-03-17 LAB — RHEUMATOID FACTOR: Rhuematoid fact SerPl-aCnc: <14 IU/mL (ref <14)

## 2019-03-17 LAB — RNP ANTIBODY: Ribonucleic Protein(ENA) Antibody, IgG: <1 AI

## 2019-03-17 LAB — ANTI-SMITH ANTIBODY: ENA SM Ab Ser-aCnc: <1 AI

## 2019-03-17 LAB — ANA: Anti Nuclear Antibody (ANA): POSITIVE — AB

## 2019-03-17 LAB — ALDOLASE: Aldolase: 4.4 U/L (ref <=8.1)

## 2019-03-17 LAB — C-REACTIVE PROTEIN: CRP: 1.8 mg/L (ref <8.0)

## 2019-03-17 LAB — VITAMIN B12: Vitamin B-12: 358 pg/mL (ref 200–1100)

## 2019-03-17 LAB — HLA-B27 ANTIGEN: HLA-B27 Antigen: POSITIVE — AB

## 2019-03-18 ENCOUNTER — Other Ambulatory Visit: Payer: Self-pay

## 2019-03-18 ENCOUNTER — Ambulatory Visit
Admission: RE | Admit: 2019-03-18 | Discharge: 2019-03-18 | Disposition: A | Discharge status: Home / Self Care | Payer: 59 | Source: Ambulatory Visit | Attending: Obstetrics and Gynecology | Admitting: Obstetrics and Gynecology

## 2019-03-18 DIAGNOSIS — Z1231 Encounter for screening mammogram for malignant neoplasm of breast: Secondary | ICD-10-CM | POA: Diagnosis not present

## 2019-03-18 NOTE — Addendum Note (Signed)
Addended by: Vicie Mutters R on: 03/18/2019 08:19 AM   Modules accepted: Orders

## 2019-03-21 ENCOUNTER — Other Ambulatory Visit: Payer: Self-pay | Admitting: Obstetrics and Gynecology

## 2019-03-21 DIAGNOSIS — R928 Other abnormal and inconclusive findings on diagnostic imaging of breast: Secondary | ICD-10-CM

## 2019-03-22 ENCOUNTER — Telehealth: Payer: Self-pay | Admitting: *Deleted

## 2019-03-22 NOTE — Telephone Encounter (Signed)
Covid-19 screening questions  Have you traveled in the last 14 days? no If yes where?  Do you now or have you had a fever in the last 14 days? no  Do you have any respiratory symptoms of shortness of breath or cough now or in the last 14 days? no  Do you have any family members or close contacts with diagnosed or suspected Covid-19 in the past 14 days? no  Have you been tested for Covid-19 and found to be positive? no       

## 2019-03-24 ENCOUNTER — Ambulatory Visit (AMBULATORY_SURGERY_CENTER): Payer: 59 | Admitting: Gastroenterology

## 2019-03-24 ENCOUNTER — Encounter: Payer: Self-pay | Admitting: Gastroenterology

## 2019-03-24 ENCOUNTER — Other Ambulatory Visit: Payer: Self-pay

## 2019-03-24 VITALS — BP 109/65 | HR 79 | Temp 97.3°F | Resp 12 | Ht 62.0 in | Wt 140.0 lb

## 2019-03-24 DIAGNOSIS — R140 Abdominal distension (gaseous): Secondary | ICD-10-CM | POA: Diagnosis not present

## 2019-03-24 DIAGNOSIS — E039 Hypothyroidism, unspecified: Secondary | ICD-10-CM | POA: Diagnosis not present

## 2019-03-24 DIAGNOSIS — D124 Benign neoplasm of descending colon: Secondary | ICD-10-CM

## 2019-03-24 DIAGNOSIS — K649 Unspecified hemorrhoids: Secondary | ICD-10-CM | POA: Diagnosis not present

## 2019-03-24 DIAGNOSIS — K5900 Constipation, unspecified: Secondary | ICD-10-CM

## 2019-03-24 DIAGNOSIS — J449 Chronic obstructive pulmonary disease, unspecified: Secondary | ICD-10-CM | POA: Diagnosis not present

## 2019-03-24 MED ORDER — SODIUM CHLORIDE 0.9 % IV SOLN: 500.0000 mL | Freq: Once | INTRAVENOUS | Status: DC

## 2019-03-24 NOTE — Op Note (Signed)
[facility] Patient Name: Carmen Hoffman Procedure Date: 03/24/2019 2:58 PM MRN: [id number] Endoscopist: Milus Banister , MD Age: 48 Referring MD:  Date of Birth: Nov 18, 1970 Gender: Female Account #: [id number] Procedure:                Colonoscopy Indications:              Constipation Medicines:                Monitored Anesthesia Care Procedure:                Pre-Anesthesia Assessment:                           - Prior to the procedure, a History and Physical                            was performed, and patient medications and                            allergies were reviewed. The patient's tolerance of                            previous anesthesia was also reviewed. The risks                            and benefits of the procedure and the sedation                            options and risks were discussed with the patient.                            All questions were answered, and informed consent                            was obtained. Prior Anticoagulants: The patient has                            taken no previous anticoagulant or antiplatelet                            agents. ASA Grade Assessment: II - A patient with                            mild systemic disease. After reviewing the risks                            and benefits, the patient was deemed in                            satisfactory condition to undergo the procedure.                           After obtaining informed consent, the colonoscope  was passed under direct vision. Throughout the                            procedure, the patient's blood pressure, pulse, and                            oxygen saturations were monitored continuously. The                            Colonoscope was introduced through the anus and                            advanced to the the cecum, identified by                            appendiceal orifice and ileocecal valve. The                  colonoscopy was performed without difficulty. The                            patient tolerated the procedure well. The quality                            of the bowel preparation was good. The ileocecal                            valve, appendiceal orifice, and rectum were                            photographed. Scope In: 3:06:17 PM Scope Out: 3:15:41 PM Scope Withdrawal Time: 0 hours 6 minutes 30 seconds  Total Procedure Duration: 0 hours 9 minutes 24 seconds  Findings:                 A 3 mm polyp was found in the descending colon. The                            polyp was sessile. The polyp was removed with a                            cold snare. Resection and retrieval were complete.                           External and internal hemorrhoids were found. The                            hemorrhoids were medium-sized.                           The exam was otherwise without abnormality on                            direct and retroflexion views. Complications:            No immediate complications. Estimated blood loss:  None. Estimated Blood Loss:     Estimated blood loss: none. Impression:               - One 3 mm polyp in the descending colon, removed                            with a cold snare. Resected and retrieved.                           - External and internal hemorrhoids.                           - The examination was otherwise normal on direct                            and retroflexion views. Recommendation:           - Patient has a contact number available for                            emergencies. The signs and symptoms of potential                            delayed complications were discussed with the                            patient. Return to normal activities tomorrow.                            Written discharge instructions were provided to the                            patient.                           - Resume  previous diet.                           - Continue present medications. Please start the                            powder fiber supplement Citrucel on a once daily                            basis.                           You will receive a letter within 2-3 weeks with the                            pathology results and my final recommendations.                           If the polyp(s) is proven to be 'pre-cancerous' on  pathology, you will need repeat colonoscopy in 7                            years. If the polyp(s) is NOT 'precancerous' on                            pathology then you should repeat colon cancer                            screening in 10 years with colonoscopy without need                            for colon cancer screening by any method prior to                            then (including stool testing). Milus Banister, MD 03/24/2019 3:18:19 PM This report has been signed electronically.

## 2019-03-24 NOTE — Progress Notes (Signed)
Called to room to assist during endoscopic procedure.  Patient ID and intended procedure confirmed with present staff. Received instructions for my participation in the procedure from the performing physician.  

## 2019-03-24 NOTE — Progress Notes (Signed)
Carmen Hoffman

## 2019-03-24 NOTE — Progress Notes (Signed)
Report to PACU, RN, vss, BBS= Clear.  

## 2019-03-24 NOTE — Patient Instructions (Signed)
YOU HAD AN ENDOSCOPIC PROCEDURE TODAY AT THE [facility]:   Refer to the procedure report that was given to you for any specific questions about what was found during the examination.  If the procedure report does not answer your questions, please call your gastroenterologist to clarify.  If you requested that your care partner not be given the details of your procedure findings, then the procedure report has been included in a sealed envelope for you to review at your convenience later.  YOU SHOULD EXPECT: Some feelings of bloating in the abdomen. Passage of more gas than usual.  Walking can help get rid of the air that was put into your GI tract during the procedure and reduce the bloating. If you had a lower endoscopy (such as a colonoscopy or flexible sigmoidoscopy) you may notice spotting of blood in your stool or on the toilet paper. If you underwent a bowel prep for your procedure, you may not have a normal bowel movement for a few days.  Please Note:  You might notice some irritation and congestion in your nose or some drainage.  This is from the oxygen used during your procedure.  There is no need for concern and it should clear up in a day or so.  SYMPTOMS TO REPORT IMMEDIATELY:   Following lower endoscopy (colonoscopy or flexible sigmoidoscopy):  Excessive amounts of blood in the stool  Significant tenderness or worsening of abdominal pains  Swelling of the abdomen that is new, acute  Fever of 100F or higher  For urgent or emergent issues, a gastroenterologist can be reached at any hour by calling [phone].   DIET:  We do recommend a small meal at first, but then you may proceed to your regular diet.  Drink plenty of fluids but you should avoid alcoholic beverages for 24 hours.  ACTIVITY:  You should plan to take it easy for the rest of today and you should NOT DRIVE or use heavy machinery until tomorrow (because of the sedation medicines used during the test).     FOLLOW UP: Our staff will call the number listed on your records 48-72 hours following your procedure to check on you and address any questions or concerns that you may have regarding the information given to you following your procedure. If we do not reach you, we will leave a message.  We will attempt to reach you two times.  During this call, we will ask if you have developed any symptoms of COVID 19. If you develop any symptoms (ie: fever, flu-like symptoms, shortness of breath, cough etc.) before then, please call [phone].  If you test positive for Covid 19 in the 2 weeks post procedure, please call and report this information to us.    If any biopsies were taken you will be contacted by phone or by letter within the next 1-3 weeks.  Please call us at [phone] if you have not heard about the biopsies in 3 weeks.    SIGNATURES/CONFIDENTIALITY: You and/or your care partner have signed paperwork which will be entered into your electronic medical record.  These signatures attest to the fact that that the information above on your After Visit Summary has been reviewed and is understood.  Full responsibility of the confidentiality of this discharge information lies with you and/or your care-partner. 

## 2019-03-27 ENCOUNTER — Ambulatory Visit
Admission: RE | Admit: 2019-03-27 | Discharge: 2019-03-27 | Disposition: A | Discharge status: Home / Self Care | Payer: 59 | Source: Ambulatory Visit | Attending: Obstetrics and Gynecology | Admitting: Obstetrics and Gynecology

## 2019-03-27 ENCOUNTER — Ambulatory Visit: Payer: 59

## 2019-03-27 ENCOUNTER — Other Ambulatory Visit: Payer: Self-pay

## 2019-03-27 DIAGNOSIS — R928 Other abnormal and inconclusive findings on diagnostic imaging of breast: Secondary | ICD-10-CM

## 2019-03-27 DIAGNOSIS — R922 Inconclusive mammogram: Secondary | ICD-10-CM | POA: Diagnosis not present

## 2019-03-28 ENCOUNTER — Telehealth: Payer: Self-pay

## 2019-03-28 NOTE — Telephone Encounter (Signed)
1. Have you developed a fever since your procedure? No  2.   Have you had an respiratory symptoms (SOB or cough) since your procedure? No  3.   Have you tested positive for COVID 19 since your procedure No  4.   Have you had any family members/close contacts diagnosed with the COVID 19 since your procedure?  No   If yes to any of these questions please route to Joylene John, RN and Alphonsa Gin, RN.  Follow up Call-  Call back number 03/24/2019  Post procedure Call Back phone  # [phone]  Permission to leave phone message Yes  Some recent data might be hidden     Patient questions:  Do you have a fever, pain , or abdominal swelling? No. Pain Score  0 *  Have you tolerated food without any problems? Yes.    Have you been able to return to your normal activities? Yes.    Do you have any questions about your discharge instructions: Diet   No. Medications  No. Follow up visit  No.  Do you have questions or concerns about your Care? No.  Actions: * If pain score is 4 or above: No action needed, pain <4.

## 2019-04-03 ENCOUNTER — Encounter: Payer: Self-pay | Admitting: Gastroenterology

## 2019-04-11 NOTE — Progress Notes (Signed)
Office Visit Note  Patient: Carmen Hoffman             Date of Birth: Mar 26, 1971           MRN: [id number]             PCP: Vicie Mutters, PA-C Referring: Vicie Mutters, PA-C Visit Date: 04/21/2019 Occupation: Phlebotomist  Subjective:  Pain in multiple joints.   History of Present Illness: Carmen Hoffman is a 48 y.o. female seen in consultation per request of her PCP.  According to patient she started having headaches back in 2016.  At that time Dr. Alain Marion gave herself a cortisone injection in her scalp which helped.  He also did x-ray of her cervical spine in 2017 which was consistent with degenerative disease of C-spine.  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  She states her neck pain has been getting worse over the years.  She has been also having lower back pain over the same time.  The last 2 years she has been experiencing pain in her hands, knees and ankles.  She denies any joint swelling but she notices fluid retention in her lower extremities.  There is positive family history of rheumatoid arthritis in her brother.  Activities of Daily Living:  Patient reports morning stiffness for 15 minutes.   Patient Reports nocturnal pain.  Difficulty dressing/grooming: Denies Difficulty climbing stairs: Denies Difficulty getting out of chair: Reports Difficulty using hands for taps, buttons, cutlery, and/or writing: Denies  Review of Systems  Constitutional: Positive for fatigue. Negative for night sweats, weight gain and weight loss.  HENT: Negative for mouth sores, trouble swallowing, trouble swallowing, mouth dryness and nose dryness.   Eyes: Negative for pain, redness, itching, visual disturbance and dryness.  Respiratory: Negative for cough, shortness of breath, wheezing and difficulty breathing.   Cardiovascular: Negative for chest pain, palpitations, hypertension, irregular heartbeat and swelling in legs/feet.  Gastrointestinal:  Positive for constipation. Negative for abdominal pain, blood in stool and diarrhea.  Endocrine: Negative for increased urination.  Genitourinary: Negative for painful urination, pelvic pain and vaginal dryness.  Musculoskeletal: Positive for arthralgias, joint pain, joint swelling and morning stiffness. Negative for myalgias, muscle weakness, muscle tenderness and myalgias.  Skin: Negative for color change, rash, hair loss, redness, skin tightness, ulcers and sensitivity to sunlight.  Allergic/Immunologic: Negative for susceptible to infections.  Neurological: Positive for headaches. Negative for dizziness, light-headedness, numbness, memory loss, night sweats and weakness.  Hematological: Negative for bruising/bleeding tendency and swollen glands.  Psychiatric/Behavioral: Negative for depressed mood, confusion and sleep disturbance. The patient is not nervous/anxious.     PMFS History:  Patient Active Problem List   Diagnosis Date Noted   Constipation 06/02/2018   History of hemorrhoids 06/02/2018   COPD (chronic obstructive pulmonary disease) ([place]) 06/18/2016   Atherosclerosis of aorta ([place]) 06/18/2016   Tobacco abuse disorder 06/06/2014   Headache, tension-type 05/17/2013   Occipital neuralgia 03/27/2013   Neck pain on right side 03/27/2013   B12 deficiency 10/17/2012   Vitamin D deficiency 10/17/2012   GERD (gastroesophageal reflux disease)    Genital herpes    ANXIETY 06/15/2008   Iron deficiency anemia 05/30/2007   Depression, major, recurrent, in partial remission ([place]) 05/30/2007    Past Medical History:  Diagnosis Date   ANEMIA-IRON DEFICIENCY 05/30/2007   Anxiety    Aphthous stomatitis    Arthritis    Depression    Genital herpes    GERD (  gastroesophageal reflux disease)    Migraines    Throat pain 07/26/2008   2015 s/p ENT eval     Von Willebrand disease ([place]) 1992    Family History  Problem Relation Age of Onset   Fibromyalgia Mother     Hypertension Mother    Hypertension Father    Arthritis Brother        rheumatoid   Healthy Daughter    Healthy Son    Cancer Paternal Uncle    Heart disease Maternal Grandfather    Breast cancer Cousin    Colon cancer Paternal Aunt    Thyroid disease Brother    Colon polyps Neg Hx    Rectal cancer Neg Hx    Stomach cancer Neg Hx    Esophageal cancer Neg Hx    Past Surgical History:  Procedure Laterality Date   NOSE SURGERY  1991   injury to nose and the doctor had to repair the vessel   Social History   Social History Narrative   Not on file   Immunization History  Administered Date(s) Administered   Influenza Split 07/18/2012   Influenza-Unspecified 07/11/2013     Objective: Vital Signs: BP 93/63 (BP Location: Right Arm, Patient Position: Sitting, Cuff Size: Normal)    Pulse 67    Resp 13    Ht 5' 2"  (1.575 m)    Wt 147 lb (66.7 kg)    BMI 26.89 kg/m    Physical Exam Vitals signs and nursing note reviewed.  Constitutional:      Appearance: She is well-developed.  HENT:     Head: Normocephalic and atraumatic.  Eyes:     Conjunctiva/sclera: Conjunctivae normal.  Neck:     Musculoskeletal: Normal range of motion.  Cardiovascular:     Rate and Rhythm: Normal rate and regular rhythm.     Heart sounds: Normal heart sounds.  Pulmonary:     Effort: Pulmonary effort is normal.     Breath sounds: Normal breath sounds.  Abdominal:     General: Bowel sounds are normal.     Palpations: Abdomen is soft.  Lymphadenopathy:     Cervical: No cervical adenopathy.  Skin:    General: Skin is warm and dry.     Capillary Refill: Capillary refill takes less than 2 seconds.  Neurological:     Mental Status: She is alert and oriented to person, place, and time.  Psychiatric:        Behavior: Behavior normal.      Musculoskeletal Exam: C-spine thoracic and lumbar spine with good range of motion.  She has discomfort range of motion of her cervical and  lumbar spine.  No SI joint tenderness was noted.  Shoulder joints, elbow joints with good range of motion.  Wrist joints MCPs PIPs and DIPs with good range of motion with no synovitis.  She has some DIP and PIP thickening bilaterally.  Hip joints, knee joints, ankles MTPs PIPs DIPs with good range of motion.  She has some DIP and PIP thickening in her feet.  No synovitis was noted.  CDAI Exam: CDAI Score: -- Patient Global: --; Provider Global: -- Swollen: --; Tender: -- Joint Exam   No joint exam has been documented for this visit   There is currently no information documented on the homunculus. Go to the Rheumatology activity and complete the homunculus joint exam.  Investigation: Findings:  03/15/19: ANA 1:40 NS, RF <14, RNP-, HLA-B27+, smith-, dsDNA <1, aldolase 4.4, CRP 1.8, vitamin B12 358  Component     Latest Ref Rng & Units 03/15/2019  E. CHAFFEENSIS AB IGG     <1:64 <1:64  E. CHAFFEENSIS AB IGM     <1:20 <1:20  Interpretation      see note  Comment      see note  ANA Titer 1     titer 1:40 (H)  ANA Pattern 1      Nuclear, Speckled (A)  Vitamin B12     200 - 1,100 pg/mL 358  CRP     <8.0 mg/L 1.8  Aldolase     < OR = 8.1 U/L 4.4  Anti Nuclear Antibody (ANA)     NEGATIVE POSITIVE (A)  ds DNA Ab     IU/mL <1  RA Latex Turbid.     <14 IU/mL <14  B burgdorferi Ab IgG+IgM     index <0.90  Ribonucleic Protein(ENA) Antibody, IgG     <1.0 NEG AI <1.0 NEG  HLA-B27 Antigen     NEGATIVE POSITIVE (A)  ENA SM Ab Ser-aCnc     <1.0 NEG AI <1.0 NEG   Imaging: Mm Diag Breast Tomo Uni Left  Result Date: 03/27/2019 CLINICAL DATA:  Possible asymmetry in the posterior, inferior left breast on a recent screening mammogram. EXAM: DIGITAL DIAGNOSTIC UNILATERAL LEFT MAMMOGRAM WITH CAD AND TOMO COMPARISON:  Previous exam(s). ACR Breast Density Category c: The breast tissue is heterogeneously dense, which may obscure small masses. FINDINGS: 3D tomographic and 2D generated true lateral  and spot compression oblique views of the left breast were obtained. These demonstrate normal appearing breast tissue at the location of the recently suspected asymmetry. Mammographic images were processed with CAD. IMPRESSION: No evidence of malignancy. The recently suspected left breast asymmetry was close apposition of normal breast tissue. RECOMMENDATION: Bilateral screening mammogram in 1 year. I have discussed the findings and recommendations with the patient. Results were also provided in writing at the conclusion of the visit. If applicable, a reminder letter will be sent to the patient regarding the next appointment. BI-RADS CATEGORY  1: Negative. Electronically Signed   By: Claudie Revering M.D.   On: 03/27/2019 10:58   Xr Cervical Spine 2 Or 3 Views  Result Date: 04/21/2019 Anterior osteophytes were noted.  Mild spondylolisthesis was noted between C4-C5.  No syndesmophytes was noted. Impression: These findings are consistent with multilevel spondylosis and facet joint arthropathy.  Xr Foot 2 Views Left  Result Date: 04/21/2019 No MTP PIP or DIP narrowing was noted.  No intertarsal joint narrowing was noted.  No tibiotalar subtalar joint space narrowing was noted.  A small posterior calcaneal spur was noted. Impression: Unremarkable x-ray of the foot.  Xr Foot 2 Views Right  Result Date: 04/21/2019 No MTP PIP or DIP narrowing was noted.  No intertarsal joint narrowing was noted.  No tibiotalar subtalar joint space narrowing was noted. Impression: Unremarkable x-ray of the foot.  Xr Hand 2 View Left  Result Date: 04/21/2019 PIP and DIP narrowing was noted.  No MCP, intercarpal or radiocarpal narrowing was noted.  No erosive changes were noted. Impression: These findings are consistent with mild osteoarthritis of the hand.  Xr Hand 2 View Right  Result Date: 04/21/2019 PIP and DIP narrowing was noted.  No MCP, intercarpal or radiocarpal narrowing was noted.  No erosive changes were noted.  Impression: These findings are consistent with mild osteoarthritis of the hand.  Xr Knee 3 View Left  Result Date: 04/21/2019 Mild medial compartment narrowing was noted.  Mild patellofemoral narrowing was noted.  No chondrocalcinosis was noted. Impression: These findings are consistent with mild osteoarthritis and mild chondromalacia patella.  Xr Knee 3 View Right  Result Date: 04/21/2019 Mild medial compartment narrowing was noted.  Mild patellofemoral narrowing was noted.  No chondrocalcinosis was noted. Impression: These findings are consistent with mild osteoarthritis and mild chondromalacia patella.  Xr Lumbar Spine 2-3 Views  Result Date: 04/21/2019 Mild spondylosis of the lumbar spine with anterior spurring was noted.  Facet joint arthropathy was noted.  No syndesmophytes were noted.  Mild levoscoliosis was noted.  No SI joint changes were noted. Impression: These findings are consistent with mild spondylosis and facet joint arthropathy.   Recent Labs: Lab Results  Component Value Date   WBC 6.4 03/15/2019   HGB 12.0 03/15/2019   PLT 214 03/15/2019   NA 138 03/15/2019   K 3.5 03/15/2019   CL 100 03/15/2019   CO2 30 03/15/2019   GLUCOSE 86 03/15/2019   BUN 14 03/15/2019   CREATININE 0.62 03/15/2019   BILITOT 0.3 03/15/2019   ALKPHOS 40 01/23/2017   AST 18 03/15/2019   ALT 11 03/15/2019   PROT 7.2 03/15/2019   ALBUMIN 3.5 01/23/2017   CALCIUM 9.4 03/15/2019   GFRAA 124 03/15/2019    Speciality Comments: No specialty comments available.  Procedures:  No procedures performed Allergies: Diflucan [fluconazole]   Assessment / Plan:     Visit Diagnoses: Pain, neck -complaints of pain and discomfort in her cervical spine and has pain radiating to her head sometimes.  She had good range of motion of her cervical spine.  Plan: XR Cervical Spine 2 or 3 views, x-rays reveal degenerative disc disease and facet joint arthropathy.  A handout on C-spine exercises was  given.  Chronic midline low back pain without sciatica -she gives history of chronic lower back pain.  She had good mobility in her lumbar spine.  She had no SI joint tenderness.  She is HLA-B27 positive.  We did discuss the significance of HLA-B27 and that it can be present in 10% of normal population without any symptoms.  There is no history of psoriasis or IBD.  Plan: XR Lumbar Spine 2-3 Views, x-ray reveals mild spondylosis and facet joint arthropathy.  No SI joint changes were noted.  A handout on lumbar spine exercises was given.  Pain in both hands -she complains of pain and stiffness in her hands.  She works as a Charity fundraiser.  She had no synovitis in her hands.  The clinical findings were consistent with osteoarthritis.  Plan: XR Hand 2 View Right, XR Hand 2 View Left, x-rays were consistent with mild osteoarthritis of bilateral hands.  Chronic pain of both knees -she complains of pain in her bilateral knee joints.  No warmth swelling or effusion was noted.  Have given her a handout on knee exercises.  Use of Voltaren gel and natural anti-inflammatories were discussed.  Plan: XR KNEE 3 VIEW RIGHT, XR KNEE 3 VIEW LEFT, x-rays of the knee joint showed mild osteoarthritis and mild chondromalacia patella.  A handout on knee joint exercises was given.  She may benefit from Voltaren gel.  Natural anti-inflammatories  were also discussed.  Sedimentation rate, Cyclic citrul peptide antibody, IgG, 14-3-3 eta Protein, Uric acid, Glucose 6 phosphate dehydrogenase,   Pain in both feet -she complains of discomfort in her ankles and feet.  No synovitis was noted.  Plan: XR Foot 2 Views Right, XR Foot 2 Views Left, x-rays were unremarkable.  Positive ANA (antinuclear antibody) -ANA is is very low titer and is not significant.  She has no clinical features of lupus or any other related autoimmune disease.  HLA B27 (HLA B27 positive) -I discussed the presents of HLA-B27 in the normal population.  Association of  HLA-B27 with psoriasis inflammatory bowel disease and a spondyloarthropathy was discussed.  At this point I do not see clinical features of any of those diseases.  Family history of rheumatoid arthritis - Brother   Other medical problems are listed as follows:  Occipital neuralgia of left side -chronic.  Vitamin D deficiency -she takes supplement.  Atherosclerosis of aorta ([facility])   History of gastroesophageal reflux (GERD)  History of COPD  History of hemorrhoids   Depression, major, recurrent, in partial remission ([facility])   B12 deficiency   History of anxiety   Genital herpes simplex, unspecified site     Orders: Orders Placed This Encounter  Procedures   XR Cervical Spine 2 or 3 views   XR Lumbar Spine 2-3 Views   XR KNEE 3 VIEW RIGHT   XR KNEE 3 VIEW LEFT   XR Hand 2 View Right   XR Hand 2 View Left   XR Foot 2 Views Right   XR Foot 2 Views Left   Sedimentation rate   Cyclic citrul peptide antibody, IgG   14-3-3 eta Protein   Uric acid   Glucose 6 phosphate dehydrogenase   No orders of the defined types were placed in this encounter.   Face-to-face time spent with patient was 45 minutes. Greater than 50% of time was spent in counseling and coordination of care.  Follow-Up Instructions: Return for Osteoarthritis, positive HLA-B27.   Bo Merino, MD  Note - This record has been created using Editor, commissioning.  Chart creation errors have been sought, but may not always  have been located. Such creation errors do not reflect on  the standard of medical care.

## 2019-04-18 ENCOUNTER — Other Ambulatory Visit: Payer: 59

## 2019-04-18 ENCOUNTER — Other Ambulatory Visit: Payer: Self-pay

## 2019-04-18 DIAGNOSIS — E039 Hypothyroidism, unspecified: Secondary | ICD-10-CM

## 2019-04-19 LAB — TSH: TSH: 0.12 mIU/L — ABNORMAL LOW

## 2019-04-21 ENCOUNTER — Ambulatory Visit: Payer: Self-pay

## 2019-04-21 ENCOUNTER — Encounter: Payer: Self-pay | Admitting: Rheumatology

## 2019-04-21 ENCOUNTER — Ambulatory Visit (INDEPENDENT_AMBULATORY_CARE_PROVIDER_SITE_OTHER): Payer: 59

## 2019-04-21 ENCOUNTER — Other Ambulatory Visit: Payer: Self-pay

## 2019-04-21 ENCOUNTER — Ambulatory Visit: Payer: 59 | Admitting: Rheumatology

## 2019-04-21 VITALS — BP 93/63 | HR 67 | Resp 13 | Ht 62.0 in | Wt 147.0 lb

## 2019-04-21 DIAGNOSIS — M542 Cervicalgia: Secondary | ICD-10-CM

## 2019-04-21 DIAGNOSIS — M5481 Occipital neuralgia: Secondary | ICD-10-CM

## 2019-04-21 DIAGNOSIS — M25561 Pain in right knee: Secondary | ICD-10-CM | POA: Diagnosis not present

## 2019-04-21 DIAGNOSIS — Z8709 Personal history of other diseases of the respiratory system: Secondary | ICD-10-CM

## 2019-04-21 DIAGNOSIS — M79672 Pain in left foot: Secondary | ICD-10-CM | POA: Diagnosis not present

## 2019-04-21 DIAGNOSIS — M79671 Pain in right foot: Secondary | ICD-10-CM

## 2019-04-21 DIAGNOSIS — M79641 Pain in right hand: Secondary | ICD-10-CM

## 2019-04-21 DIAGNOSIS — R768 Other specified abnormal immunological findings in serum: Secondary | ICD-10-CM

## 2019-04-21 DIAGNOSIS — Z1589 Genetic susceptibility to other disease: Secondary | ICD-10-CM

## 2019-04-21 DIAGNOSIS — I700 Atherosclerosis of aorta: Secondary | ICD-10-CM

## 2019-04-21 DIAGNOSIS — Z8261 Family history of arthritis: Secondary | ICD-10-CM

## 2019-04-21 DIAGNOSIS — M25562 Pain in left knee: Secondary | ICD-10-CM

## 2019-04-21 DIAGNOSIS — G8929 Other chronic pain: Secondary | ICD-10-CM

## 2019-04-21 DIAGNOSIS — M79642 Pain in left hand: Secondary | ICD-10-CM

## 2019-04-21 DIAGNOSIS — A6000 Herpesviral infection of urogenital system, unspecified: Secondary | ICD-10-CM

## 2019-04-21 DIAGNOSIS — M545 Low back pain: Secondary | ICD-10-CM

## 2019-04-21 DIAGNOSIS — Z8719 Personal history of other diseases of the digestive system: Secondary | ICD-10-CM

## 2019-04-21 DIAGNOSIS — F3341 Major depressive disorder, recurrent, in partial remission: Secondary | ICD-10-CM

## 2019-04-21 DIAGNOSIS — E559 Vitamin D deficiency, unspecified: Secondary | ICD-10-CM

## 2019-04-21 DIAGNOSIS — Z8659 Personal history of other mental and behavioral disorders: Secondary | ICD-10-CM

## 2019-04-21 DIAGNOSIS — E538 Deficiency of other specified B group vitamins: Secondary | ICD-10-CM

## 2019-04-21 NOTE — Patient Instructions (Signed)
Journal for Nurse Practitioners, 15(4), 263-267. Retrieved July 18, 2018 from [email or web address]">  Knee Exercises Ask your health care provider which exercises are safe for you. Do exercises exactly as told by your health care provider and adjust them as directed. It is normal to feel mild stretching, pulling, tightness, or discomfort as you do these exercises. Stop right away if you feel sudden pain or your pain gets worse. Do not begin these exercises until told by your health care provider. Stretching and range-of-motion exercises These exercises warm up your muscles and joints and improve the movement and flexibility of your knee. These exercises also help to relieve pain and swelling. Knee extension, prone 1. Lie on your abdomen (prone position) on a bed. 2. Place your left / right knee just beyond the edge of the surface so your knee is not on the bed. You can put a towel under your left / right thigh just above your kneecap for comfort. 3. Relax your leg muscles and allow gravity to straighten your knee (extension). You should feel a stretch behind your left / right knee. 4. Hold this position for __________ seconds. 5. Scoot up so your knee is supported between repetitions. Repeat __________ times. Complete this exercise __________ times a day. Knee flexion, active  1. Lie on your back with both legs straight. If this causes back discomfort, bend your left / right knee so your foot is flat on the floor. 2. Slowly slide your left / right heel back toward your buttocks. Stop when you feel a gentle stretch in the front of your knee or thigh (flexion). 3. Hold this position for __________ seconds. 4. Slowly slide your left / right heel back to the starting position. Repeat __________ times. Complete this exercise __________ times a day. Quadriceps stretch, prone  1. Lie on your abdomen on a firm surface, such as a bed or padded floor. 2. Bend your left / right knee and hold  your ankle. If you cannot reach your ankle or pant leg, loop a belt around your foot and grab the belt instead. 3. Gently pull your heel toward your buttocks. Your knee should not slide out to the side. You should feel a stretch in the front of your thigh and knee (quadriceps). 4. Hold this position for __________ seconds. Repeat __________ times. Complete this exercise __________ times a day. Hamstring, supine 1. Lie on your back (supine position). 2. Loop a belt or towel over the ball of your left / right foot. The ball of your foot is on the walking surface, right under your toes. 3. Straighten your left / right knee and slowly pull on the belt to raise your leg until you feel a gentle stretch behind your knee (hamstring). ? Do not let your knee bend while you do this. ? Keep your other leg flat on the floor. 4. Hold this position for __________ seconds. Repeat __________ times. Complete this exercise __________ times a day. Strengthening exercises These exercises build strength and endurance in your knee. Endurance is the ability to use your muscles for a long time, even after they get tired. Quadriceps, isometric This exercise stretches the muscles in front of your thigh (quadriceps) without moving your knee joint (isometric). 1. Lie on your back with your left / right leg extended and your other knee bent. Put a rolled towel or small pillow under your knee if told by your health care provider. 2. Slowly tense the muscles in the front of your left /   right thigh. You should see your kneecap slide up toward your hip or see increased dimpling just above the knee. This motion will push the back of the knee toward the floor. 3. For __________ seconds, hold the muscle as tight as you can without increasing your pain. 4. Relax the muscles slowly and completely. Repeat __________ times. Complete this exercise __________ times a day. Straight leg raises This exercise stretches the muscles in front  of your thigh (quadriceps) and the muscles that move your hips (hip flexors). 1. Lie on your back with your left / right leg extended and your other knee bent. 2. Tense the muscles in the front of your left / right thigh. You should see your kneecap slide up or see increased dimpling just above the knee. Your thigh may even shake a bit. 3. Keep these muscles tight as you raise your leg 4-6 inches (10-15 cm) off the floor. Do not let your knee bend. 4. Hold this position for __________ seconds. 5. Keep these muscles tense as you lower your leg. 6. Relax your muscles slowly and completely after each repetition. Repeat __________ times. Complete this exercise __________ times a day. Hamstring, isometric 1. Lie on your back on a firm surface. 2. Bend your left / right knee about __________ degrees. 3. Dig your left / right heel into the surface as if you are trying to pull it toward your buttocks. Tighten the muscles in the back of your thighs (hamstring) to "dig" as hard as you can without increasing any pain. 4. Hold this position for __________ seconds. 5. Release the tension gradually and allow your muscles to relax completely for __________ seconds after each repetition. Repeat __________ times. Complete this exercise __________ times a day. Hamstring curls If told by your health care provider, do this exercise while wearing ankle weights. Begin with __________ lb weights. Then increase the weight by 1 lb (0.5 kg) increments. Do not wear ankle weights that are more than __________ lb. 1. Lie on your abdomen with your legs straight. 2. Bend your left / right knee as far as you can without feeling pain. Keep your hips flat against the floor. 3. Hold this position for __________ seconds. 4. Slowly lower your leg to the starting position. Repeat __________ times. Complete this exercise __________ times a day. Squats This exercise strengthens the muscles in front of your thigh and knee  (quadriceps). 1. Stand in front of a table, with your feet and knees pointing straight ahead. You may rest your hands on the table for balance but not for support. 2. Slowly bend your knees and lower your hips like you are going to sit in a chair. ? Keep your weight over your heels, not over your toes. ? Keep your lower legs upright so they are parallel with the table legs. ? Do not let your hips go lower than your knees. ? Do not bend lower than told by your health care provider. ? If your knee pain increases, do not bend as low. 3. Hold the squat position for __________ seconds. 4. Slowly push with your legs to return to standing. Do not use your hands to pull yourself to standing. Repeat __________ times. Complete this exercise __________ times a day. Wall slides This exercise strengthens the muscles in front of your thigh and knee (quadriceps). 1. Lean your back against a smooth wall or door, and walk your feet out 18-24 inches (46-61 cm) from it. 2. Place your feet hip-width apart. 3.   Slowly slide down the wall or door until your knees bend __________ degrees. Keep your knees over your heels, not over your toes. Keep your knees in line with your hips. 4. Hold this position for __________ seconds. Repeat __________ times. Complete this exercise __________ times a day. Straight leg raises This exercise strengthens the muscles that rotate the leg at the hip and move it away from your body (hip abductors). 1. Lie on your side with your left / right leg in the top position. Lie so your head, shoulder, knee, and hip line up. You may bend your bottom knee to help you keep your balance. 2. Roll your hips slightly forward so your hips are stacked directly over each other and your left / right knee is facing forward. 3. Leading with your heel, lift your top leg 4-6 inches (10-15 cm). You should feel the muscles in your outer hip lifting. ? Do not let your foot drift forward. ? Do not let your knee  roll toward the ceiling. 4. Hold this position for __________ seconds. 5. Slowly return your leg to the starting position. 6. Let your muscles relax completely after each repetition. Repeat __________ times. Complete this exercise __________ times a day. Straight leg raises This exercise stretches the muscles that move your hips away from the front of the pelvis (hip extensors). 1. Lie on your abdomen on a firm surface. You can put a pillow under your hips if that is more comfortable. 2. Tense the muscles in your buttocks and lift your left / right leg about 4-6 inches (10-15 cm). Keep your knee straight as you lift your leg. 3. Hold this position for __________ seconds. 4. Slowly lower your leg to the starting position. 5. Let your leg relax completely after each repetition. Repeat __________ times. Complete this exercise __________ times a day. This information is not intended to replace advice given to you by your health care provider. Make sure you discuss any questions you have with your health care provider. Document Released: 08/12/2005 Document Revised: 07/19/2018 Document Reviewed: 07/19/2018 [employer] Patient Education  2020 [employer] Back Exercises The following exercises strengthen the muscles that help to support the trunk and back. They also help to keep the lower back flexible. Doing these exercises can help to prevent back pain or lessen existing pain.  If you have back pain or discomfort, try doing these exercises 2-3 times each day or as told by your health care provider.  As your pain improves, do them once each day, but increase the number of times that you repeat the steps for each exercise (do more repetitions).  To prevent the recurrence of back pain, continue to do these exercises once each day or as told by your health care provider. Do exercises exactly as told by your health care provider and adjust them as directed. It is normal to feel mild stretching,  pulling, tightness, or discomfort as you do these exercises, but you should stop right away if you feel sudden pain or your pain gets worse. Exercises Single knee to chest Repeat these steps 3-5 times for each leg: 1. Lie on your back on a firm bed or the floor with your legs extended. 2. Bring one knee to your chest. Your other leg should stay extended and in contact with the floor. 3. Hold your knee in place by grabbing your knee or thigh with both hands and hold. 4. Pull on your knee until you feel a gentle stretch in your   lower back or buttocks. 5. Hold the stretch for 10-30 seconds. 6. Slowly release and straighten your leg. Pelvic tilt Repeat these steps 5-10 times: 1. Lie on your back on a firm bed or the floor with your legs extended. 2. Bend your knees so they are pointing toward the ceiling and your feet are flat on the floor. 3. Tighten your lower abdominal muscles to press your lower back against the floor. This motion will tilt your pelvis so your tailbone points up toward the ceiling instead of pointing to your feet or the floor. 4. With gentle tension and even breathing, hold this position for 5-10 seconds. Cat-cow Repeat these steps until your lower back becomes more flexible: 1. Get into a hands-and-knees position on a firm surface. Keep your hands under your shoulders, and keep your knees under your hips. You may place padding under your knees for comfort. 2. Let your head hang down toward your chest. Contract your abdominal muscles and point your tailbone toward the floor so your lower back becomes rounded like the back of a cat. 3. Hold this position for 5 seconds. 4. Slowly lift your head, let your abdominal muscles relax and point your tailbone up toward the ceiling so your back forms a sagging arch like the back of a cow. 5. Hold this position for 5 seconds.  Press-ups Repeat these steps 5-10 times: 1. Lie on your abdomen (face-down) on the floor. 2. Place your palms  near your head, about shoulder-width apart. 3. Keeping your back as relaxed as possible and keeping your hips on the floor, slowly straighten your arms to raise the top half of your body and lift your shoulders. Do not use your back muscles to raise your upper torso. You may adjust the placement of your hands to make yourself more comfortable. 4. Hold this position for 5 seconds while you keep your back relaxed. 5. Slowly return to lying flat on the floor.  Bridges Repeat these steps 10 times: 1. Lie on your back on a firm surface. 2. Bend your knees so they are pointing toward the ceiling and your feet are flat on the floor. Your arms should be flat at your sides, next to your body. 3. Tighten your buttocks muscles and lift your buttocks off the floor until your waist is at almost the same height as your knees. You should feel the muscles working in your buttocks and the back of your thighs. If you do not feel these muscles, slide your feet 1-2 inches farther away from your buttocks. 4. Hold this position for 3-5 seconds. 5. Slowly lower your hips to the starting position, and allow your buttocks muscles to relax completely. If this exercise is too easy, try doing it with your arms crossed over your chest. Abdominal crunches Repeat these steps 5-10 times: 1. Lie on your back on a firm bed or the floor with your legs extended. 2. Bend your knees so they are pointing toward the ceiling and your feet are flat on the floor. 3. Cross your arms over your chest. 4. Tip your chin slightly toward your chest without bending your neck. 5. Tighten your abdominal muscles and slowly raise your trunk (torso) high enough to lift your shoulder blades a tiny bit off the floor. Avoid raising your torso higher than that because it can put too much stress on your low back and does not help to strengthen your abdominal muscles. 6. Slowly return to your starting position. Back lifts Repeat these   steps 5-10 times:  1. Lie on your abdomen (face-down) with your arms at your sides, and rest your forehead on the floor. 2. Tighten the muscles in your legs and your buttocks. 3. Slowly lift your chest off the floor while you keep your hips pressed to the floor. Keep the back of your head in line with the curve in your back. Your eyes should be looking at the floor. 4. Hold this position for 3-5 seconds. 5. Slowly return to your starting position. Contact a health care provider if:  Your back pain or discomfort gets much worse when you do an exercise.  Your worsening back pain or discomfort does not lessen within 2 hours after you exercise. If you have any of these problems, stop doing these exercises right away. Do not do them again unless your health care provider says that you can. Get help right away if:  You develop sudden, severe back pain. If this happens, stop doing the exercises right away. Do not do them again unless your health care provider says that you can. This information is not intended to replace advice given to you by your health care provider. Make sure you discuss any questions you have with your health care provider. Document Released: 11/05/2004 Document Revised: 02/02/2019 Document Reviewed: 06/30/2018 Else[employer] Patient Education  [place]. Cervical Strain and Sprain Rehab Ask your health care provider which exercises are safe for you. Do exercises exactly as told by your health care provider and adjust them as directed. It is normal to feel mild stretching, pulling, tightness, or discomfort as you do these exercises. Stop right away if you feel sudden pain or your pain gets worse. Do not begin these exercises until told by your health care provider. Stretching and range-of-motion exercises Cervical side bending  6. Using good posture, sit on a stable chair or stand up. 7. Without moving your shoulders, slowly tilt your left / right ear to your shoulder until you feel a stretch  in the opposite side neck muscles. You should be looking straight ahead. 8. Hold for __________ seconds. 9. Repeat with the other side of your neck. Repeat __________ times. Complete this exercise __________ times a day. Cervical rotation  1. Using good posture, sit on a stable chair or stand up. 2. Slowly turn your head to the side as if you are looking over your left / right shoulder. ? Keep your eyes level with the ground. ? Stop when you feel a stretch along the side and the back of your neck. 3. Hold for __________ seconds. 4. Repeat this by turning to your other side. Repeat __________ times. Complete this exercise __________ times a day. Thoracic extension and pectoral stretch 5. Roll a towel or a small blanket so it is about 4 inches (10 cm) in diameter. 6. Lie down on your back on a firm surface. 7. Put the towel lengthwise, under your spine in the middle of your back. It should not be under your shoulder blades. The towel should line up with your spine from your middle back to your lower back. 8. Put your hands behind your head and let your elbows fall out to your sides. 9. Hold for __________ seconds. Repeat __________ times. Complete this exercise __________ times a day. Strengthening exercises Isometric upper cervical flexion 1. Lie on your back with a thin pillow behind your head and a small rolled-up towel under your neck. 2. Gently tuck your chin toward your chest and nod your head  down to look toward your feet. Do not lift your head off the pillow. 3. Hold for __________ seconds. 4. Release the tension slowly. Relax your neck muscles completely before you repeat this exercise. Repeat __________ times. Complete this exercise __________ times a day. Isometric cervical extension  5. Stand about 6 inches (15 cm) away from a wall, with your back facing the wall. 6. Place a soft object, about 6-8 inches (15-20 cm) in diameter, between the back of your head and the wall. A  soft object could be a small pillow, a ball, or a folded towel. 7. Gently tilt your head back and press into the soft object. Keep your jaw and forehead relaxed. 8. Hold for __________ seconds. 9. Release the tension slowly. Relax your neck muscles completely before you repeat this exercise. Repeat __________ times. Complete this exercise __________ times a day. Posture and body mechanics Body mechanics refers to the movements and positions of your body while you do your daily activities. Posture is part of body mechanics. Good posture and healthy body mechanics can help to relieve stress in your body's tissues and joints. Good posture means that your spine is in its natural S-curve position (your spine is neutral), your shoulders are pulled back slightly, and your head is not tipped forward. The following are general guidelines for applying improved posture and body mechanics to your everyday activities. Sitting  7. When sitting, keep your spine neutral and keep your feet flat on the floor. Use a footrest, if necessary, and keep your thighs parallel to the floor. Avoid rounding your shoulders, and avoid tilting your head forward. 8. When working at a desk or a computer, keep your desk at a height where your hands are slightly lower than your elbows. Slide your chair under your desk so you are close enough to maintain good posture. 9. When working at a computer, place your monitor at a height where you are looking straight ahead and you do not have to tilt your head forward or downward to look at the screen. Standing   When standing, keep your spine neutral and keep your feet about hip-width apart. Keep a slight bend in your knees. Your ears, shoulders, and hips should line up.  When you do a task in which you stand in one place for a long time, place one foot up on a stable object that is 2-4 inches (5-10 cm) high, such as a footstool. This helps keep your spine neutral. Resting When lying down  and resting, avoid positions that are most painful for you. Try to support your neck in a neutral position. You can use a contour pillow or a small rolled-up towel. Your pillow should support your neck but not push on it. This information is not intended to replace advice given to you by your health care provider. Make sure you discuss any questions you have with your health care provider. Document Released: 09/28/2005 Document Revised: 01/18/2019 Document Reviewed: 06/29/2018 Else[employer] Patient Education  2020 [employer].

## 2019-04-27 LAB — 14-3-3 ETA PROTEIN: 14-3-3 eta Protein: <0.2 ng/mL (ref <0.2)

## 2019-04-27 LAB — SEDIMENTATION RATE: Sed Rate: 6 mm/h (ref 0–20)

## 2019-04-27 LAB — URIC ACID: Uric Acid, Serum: 2.7 mg/dL (ref 2.5–7.0)

## 2019-04-27 LAB — CYCLIC CITRUL PEPTIDE ANTIBODY, IGG: Cyclic Citrullin Peptide Ab: 16 UNITS

## 2019-04-27 LAB — GLUCOSE 6 PHOSPHATE DEHYDROGENASE: G-6PDH: 11.7 U/g Hgb (ref 7.0–20.5)

## 2019-04-27 NOTE — Progress Notes (Signed)
I will discuss labs at the fu visit.

## 2019-05-09 NOTE — Progress Notes (Signed)
Office Visit Note  Patient: Carmen Hoffman             Date of Birth: Aug 07, 1971           MRN: [id number]             PCP: Vicie Mutters, PA-C Referring: No ref. provider found Visit Date: 05/16/2019 Occupation: @GUAROCC @  Subjective:  Lower back pain   History of Present Illness: Carmen Hoffman is a 48 y.o. female with history of positive ANA, osteoarthritis, and DDD.   She reports since her initial visit she has not developed any new or worsening symptoms.  She continues to have chronic neck pain and stiffness for several hours every morning.  She states her lower back pain typically improves with activity. She rates her pain a 4/10. She denies any radiating pain or numbness at this time.  She has trapezius muscle tension and tenderness bilaterally.  She has occasional pain in both ankle joints and discomfort in her knee joints if she walks a prolonged distance.  She denies any joint swelling.   She denies any recent rashes, sores in her mouth or nose, hair loss, photosensitivity, symptoms of Raynaud's, fevers, swollen lymph nodes, palpitations or shortness of breath.      Activities of Daily Living:  Patient reports morning stiffness for several hours.   Patient Denies nocturnal pain.  Difficulty dressing/grooming: Denies Difficulty climbing stairs: Denies Difficulty getting out of chair: Denies Difficulty using hands for taps, buttons, cutlery, and/or writing: Denies  Review of Systems  Constitutional: Negative for fatigue.  HENT: Negative for mouth sores, mouth dryness and nose dryness.   Eyes: Negative for itching and dryness.  Respiratory: Negative for shortness of breath, wheezing and difficulty breathing.   Cardiovascular: Negative for chest pain, palpitations and swelling in legs/feet.  Gastrointestinal: Positive for constipation. Negative for abdominal pain, blood in stool and diarrhea.  Endocrine: Negative for increased urination.  Genitourinary: Negative for  painful urination.  Musculoskeletal: Positive for arthralgias, joint pain and morning stiffness. Negative for joint swelling.  Skin: Negative for rash, hair loss and redness.  Allergic/Immunologic: Negative for susceptible to infections.  Neurological: Positive for headaches. Negative for dizziness, light-headedness, memory loss and weakness.  Hematological: Negative for swollen glands.  Psychiatric/Behavioral: Positive for sleep disturbance. Negative for confusion.    PMFS History:  Patient Active Problem List   Diagnosis Date Noted  . Constipation 06/02/2018  . History of hemorrhoids 06/02/2018  . COPD (chronic obstructive pulmonary disease) ([facility]) 06/18/2016  . Atherosclerosis of aorta ([facility]) 06/18/2016  . Tobacco abuse disorder 06/06/2014  . Headache, tension-type 05/17/2013  . Occipital neuralgia 03/27/2013  . Neck pain on right side 03/27/2013  . B12 deficiency 10/17/2012  . Vitamin D deficiency 10/17/2012  . GERD (gastroesophageal reflux disease)   . Genital herpes   . ANXIETY 06/15/2008  . Iron deficiency anemia 05/30/2007  . Depression, major, recurrent, in partial remission ([facility]) 05/30/2007    Past Medical History:  Diagnosis Date  . ANEMIA-IRON DEFICIENCY 05/30/2007  . Anxiety   . Aphthous stomatitis   . Arthritis   . Depression   . Genital herpes   . GERD (gastroesophageal reflux disease)   . Migraines   . Throat pain 07/26/2008   2015 s/p ENT eval    . Von Willebrand disease ([facility]) 1992    Family History  Problem Relation Age of Onset  . Fibromyalgia Mother   . Hypertension Mother   . Hypertension Father   .  Arthritis Brother        rheumatoid  . Healthy Daughter   . Healthy Son   . Cancer Paternal Uncle   . Heart disease Maternal Grandfather   . Breast cancer Cousin   . Colon cancer Paternal Aunt   . Thyroid disease Brother   . Colon polyps Neg Hx   . Rectal cancer Neg Hx   . Stomach cancer Neg Hx   . Esophageal cancer Neg Hx    Past Surgical  History:  Procedure Laterality Date  . NOSE SURGERY  1991   injury to nose and the doctor had to repair the vessel   Social History   Social History Narrative  . Not on file   Immunization History  Administered Date(s) Administered  . Influenza Split 07/18/2012  . Influenza-Unspecified 07/11/2013     Objective: Vital Signs: BP 93/60 (BP Location: Left Arm, Patient Position: Sitting, Cuff Size: Normal)   Pulse 68   Resp 12   Ht 5' 2"  (1.575 m)   Wt 138 lb 9.6 oz (62.9 kg)   BMI 25.35 kg/m    Physical Exam Vitals signs and nursing note reviewed.  Constitutional:      Appearance: She is well-developed.  HENT:     Head: Normocephalic and atraumatic.  Eyes:     Conjunctiva/sclera: Conjunctivae normal.  Neck:     Musculoskeletal: Normal range of motion.  Cardiovascular:     Rate and Rhythm: Normal rate and regular rhythm.     Heart sounds: Normal heart sounds.  Pulmonary:     Effort: Pulmonary effort is normal.     Breath sounds: Normal breath sounds.  Abdominal:     General: Bowel sounds are normal.     Palpations: Abdomen is soft.  Lymphadenopathy:     Cervical: No cervical adenopathy.  Skin:    General: Skin is warm and dry.     Capillary Refill: Capillary refill takes less than 2 seconds.  Neurological:     Mental Status: She is alert and oriented to person, place, and time.  Psychiatric:        Behavior: Behavior normal.      Musculoskeletal Exam: C-spine has good ROM with some discomfort.  Trapezius muscle tension and tenderness.  Thoracic and lumbar spine have good ROM.  Midline spinal tenderness in the lumbar region.  No SI joint tenderness.  Shoulder joints, elbow joints, wrist joints, MCPs, PIPs, and DIPs good ROM with no synovitis.  Complete fist formation bilaterally.  Hip joints, knee joints, ankle joints, MTPs, PIPs, and DIPs good ROM with no synovitis.  No warmth or effusion of knee joints.  No tenderness or swelling of ankle joints.  No achilles  tendonitis or plantar fasciitis.  No tenderness over trochanteric bursa bilaterally.   CDAI Exam: CDAI Score: - Patient Global: -; Provider Global: - Swollen: -; Tender: - Joint Exam   No joint exam has been documented for this visit   There is currently no information documented on the homunculus. Go to the Rheumatology activity and complete the homunculus joint exam.  Investigation: No additional findings.  Imaging: Xr Cervical Spine 2 Or 3 Views  Result Date: 04/21/2019 Anterior osteophytes were noted.  Mild spondylolisthesis was noted between C4-C5.  No syndesmophytes was noted. Impression: These findings are consistent with multilevel spondylosis and facet joint arthropathy.  Xr Foot 2 Views Left  Result Date: 04/21/2019 No MTP PIP or DIP narrowing was noted.  No intertarsal joint narrowing was noted.  No  tibiotalar subtalar joint space narrowing was noted.  A small posterior calcaneal spur was noted. Impression: Unremarkable x-ray of the foot.  Xr Foot 2 Views Right  Result Date: 04/21/2019 No MTP PIP or DIP narrowing was noted.  No intertarsal joint narrowing was noted.  No tibiotalar subtalar joint space narrowing was noted. Impression: Unremarkable x-ray of the foot.  Xr Hand 2 View Left  Result Date: 04/21/2019 PIP and DIP narrowing was noted.  No MCP, intercarpal or radiocarpal narrowing was noted.  No erosive changes were noted. Impression: These findings are consistent with mild osteoarthritis of the hand.  Xr Hand 2 View Right  Result Date: 04/21/2019 PIP and DIP narrowing was noted.  No MCP, intercarpal or radiocarpal narrowing was noted.  No erosive changes were noted. Impression: These findings are consistent with mild osteoarthritis of the hand.  Xr Knee 3 View Left  Result Date: 04/21/2019 Mild medial compartment narrowing was noted.  Mild patellofemoral narrowing was noted.  No chondrocalcinosis was noted. Impression: These findings are consistent with mild  osteoarthritis and mild chondromalacia patella.  Xr Knee 3 View Right  Result Date: 04/21/2019 Mild medial compartment narrowing was noted.  Mild patellofemoral narrowing was noted.  No chondrocalcinosis was noted. Impression: These findings are consistent with mild osteoarthritis and mild chondromalacia patella.  Xr Lumbar Spine 2-3 Views  Result Date: 04/21/2019 Mild spondylosis of the lumbar spine with anterior spurring was noted.  Facet joint arthropathy was noted.  No syndesmophytes were noted.  Mild levoscoliosis was noted.  No SI joint changes were noted. Impression: These findings are consistent with mild spondylosis and facet joint arthropathy.   Recent Labs: Lab Results  Component Value Date   WBC 6.4 03/15/2019   HGB 12.0 03/15/2019   PLT 214 03/15/2019   NA 138 03/15/2019   K 3.5 03/15/2019   CL 100 03/15/2019   CO2 30 03/15/2019   GLUCOSE 86 03/15/2019   BUN 14 03/15/2019   CREATININE 0.62 03/15/2019   BILITOT 0.3 03/15/2019   ALKPHOS 40 01/23/2017   AST 18 03/15/2019   ALT 11 03/15/2019   PROT 7.2 03/15/2019   ALBUMIN 3.5 01/23/2017   CALCIUM 9.4 03/15/2019   GFRAA 124 03/15/2019  April 21, 2019 ESR 6, uric acid 2.7, anti-CCP negative, 14 12/12/2008-, G6PD normal  Speciality Comments: No specialty comments available.  Procedures:  No procedures performed Allergies: Diflucan [fluconazole]   Assessment / Plan:     Visit Diagnoses: DDD (degenerative disc disease), cervical - with facet joint arthropathy: She has chronic neck pain and stiffness.  She has no symptoms of radiculopathy.  She has trapezius muscle tension and tenderness bilaterally.  She was given a handout of neck exercises to perform.   DDD (degenerative disc disease), lumbar - Mild spondylosis and facet joint arthropathy: She has good ROM with no discomfort.  She has midline spinal tenderness.  No SI joint tenderness.  She reports her lower back pain improves with activity.  She continues to have  morning stiffness for several hours. She was given a handout of back exercises.   HLA B27 positive - We discussed the significance of HLA B27 positivity.  She has no SI joint tenderness at this time. No SI joint changes were noted on lumbar x-rays from 04/21/19. She has chronic midline lower back pain. We discussed that if her lower back pain persists or worsens we can schedule a MRI in the future.  She has no other features of spondyloarthropathy at this time.  Primary osteoarthritis of both hands - Bilateral mild - She has no tenderness or synovitis on exam.  She has complete fist formation.  Joint protection and muscle strengthening were discussed. She was advised to notify us if she develops increased joint pain or joint swelling.   Primary osteoarthritis of both knees - Mild osteoarthritis and mild chondromalacia patella - She has good ROM with no discomfort.  No warmth or effusion of knee joints noted on exam.  She experiences discomfort in her knee joints if she walks for a prolonged period of time.   Positive ANA (antinuclear antibody) - Not a significant titer.  No clinical features of autoimmune disease.  We discussed lab work at length.  We will monitor lab work on a yearly basis.  She was advised develops new or worsening symptoms.    Family history of rheumatoid arthritis - In her brother.  Other medical problems are listed as follows:  Occipital neuralgia of left side -chronic.  Vitamin D deficiency -she takes supplement.  Atherosclerosis of aorta ([facility])   History of gastroesophageal reflux (GERD)  History of COPD  History of hemorrhoids   Depression, major, recurrent, in partial remission ([facility])   B12 deficiency   History of anxiety   Genital herpes simplex, unspecified site   Orders: No orders of the defined types were placed in this encounter.  No orders of the defined types were placed in this encounter.   Face-to-face time spent with patient was 30  minutes. Greater than 50% of time was spent in counseling and coordination of care.  Follow-Up Instructions: Return if symptoms worsen or fail to improve, for DDD, Osteoarthritis.   Ofilia Neas, PA-C   I examined and evaluated the patient with Hazel Sams PA.  We had detailed discussion with the patient regarding her labs.  She is HLA-B27 positive but had no radiographic changes on the x-rays.  I discussed the option of MRI of the SI joints but she declined.  She states if her symptoms persist or get worse she will let us know.  She has low titer positive ANA but no clinical features of autoimmune disease.  The plan of care was discussed as noted above.  Bo Merino, MD  Note - This record has been created using Editor, commissioning.  Chart creation errors have been sought, but may not always  have been located. Such creation errors do not reflect on  the standard of medical care.

## 2019-05-16 ENCOUNTER — Other Ambulatory Visit: Payer: Self-pay

## 2019-05-16 ENCOUNTER — Encounter: Payer: Self-pay | Admitting: Rheumatology

## 2019-05-16 ENCOUNTER — Ambulatory Visit: Payer: 59 | Admitting: Rheumatology

## 2019-05-16 VITALS — BP 93/60 | HR 68 | Resp 12 | Ht 62.0 in | Wt 138.6 lb

## 2019-05-16 DIAGNOSIS — R768 Other specified abnormal immunological findings in serum: Secondary | ICD-10-CM | POA: Diagnosis not present

## 2019-05-16 DIAGNOSIS — M5030 Other cervical disc degeneration, unspecified cervical region: Secondary | ICD-10-CM

## 2019-05-16 DIAGNOSIS — M170 Bilateral primary osteoarthritis of knee: Secondary | ICD-10-CM

## 2019-05-16 DIAGNOSIS — M19041 Primary osteoarthritis, right hand: Secondary | ICD-10-CM | POA: Diagnosis not present

## 2019-05-16 DIAGNOSIS — Z1589 Genetic susceptibility to other disease: Secondary | ICD-10-CM

## 2019-05-16 DIAGNOSIS — M19042 Primary osteoarthritis, left hand: Secondary | ICD-10-CM

## 2019-05-16 DIAGNOSIS — M5136 Other intervertebral disc degeneration, lumbar region: Secondary | ICD-10-CM | POA: Diagnosis not present

## 2019-05-16 DIAGNOSIS — Z8261 Family history of arthritis: Secondary | ICD-10-CM

## 2019-05-16 NOTE — Patient Instructions (Signed)
Neck Exercises Ask your health care provider which exercises are safe for you. Do exercises exactly as told by your health care provider and adjust them as directed. It is normal to feel mild stretching, pulling, tightness, or discomfort as you do these exercises. Stop right away if you feel sudden pain or your pain gets worse. Do not begin these exercises until told by your health care provider. Neck exercises can be important for many reasons. They can improve strength and maintain flexibility in your neck, which will help your upper back and prevent neck pain. Stretching exercises Rotation neck stretching  1. Sit in a chair or stand up. 2. Place your feet flat on the floor, shoulder width apart. 3. Slowly turn your head (rotate) to the right until a slight stretch is felt. Turn it all the way to the right so you can look over your right shoulder. Do not tilt or tip your head. 4. Hold this position for 10-30 seconds. 5. Slowly turn your head (rotate) to the left until a slight stretch is felt. Turn it all the way to the left so you can look over your left shoulder. Do not tilt or tip your head. 6. Hold this position for 10-30 seconds. Repeat __________ times. Complete this exercise __________ times a day. Neck retraction 1. Sit in a sturdy chair or stand up. 2. Look straight ahead. Do not bend your neck. 3. Use your fingers to push your chin backward (retraction). Do not bend your neck for this movement. Continue to face straight ahead. If you are doing the exercise properly, you will feel a slight sensation in your throat and a stretch at the back of your neck. 4. Hold the stretch for 1-2 seconds. Repeat __________ times. Complete this exercise __________ times a day. Strengthening exercises Neck press 1. Lie on your back on a firm bed or on the floor with a pillow under your head. 2. Use your neck muscles to push your head down on the pillow and straighten your spine. 3. Hold the position  as well as you can. Keep your head facing up (in a neutral position) and your chin tucked. 4. Slowly count to 5 while holding this position. Repeat __________ times. Complete this exercise __________ times a day. Isometrics These are exercises in which you strengthen the muscles in your neck while keeping your neck still (isometrics). 1. Sit in a supportive chair and place your hand on your forehead. 2. Keep your head and face facing straight ahead. Do not flex or extend your neck while doing isometrics. 3. Push forward with your head and neck while pushing back with your hand. Hold for 10 seconds. 4. Do the sequence again, this time putting your hand against the back of your head. Use your head and neck to push backward against the hand pressure. 5. Finally, do the same exercise on either side of your head, pushing sideways against the pressure of your hand. Repeat __________ times. Complete this exercise __________ times a day. Prone head lifts 1. Lie face-down (prone position), resting on your elbows so that your chest and upper back are raised. 2. Start with your head facing downward, near your chest. Position your chin either on or near your chest. 3. Slowly lift your head upward. Lift until you are looking straight ahead. Then continue lifting your head as far back as you can comfortably stretch. 4. Hold your head up for 5 seconds. Then slowly lower it to your starting position. Repeat __________   times. Complete this exercise __________ times a day. Supine head lifts 1. Lie on your back (supine position), bending your knees to point to the ceiling and keeping your feet flat on the floor. 2. Lift your head slowly off the floor, raising your chin toward your chest. 3. Hold for 5 seconds. Repeat __________ times. Complete this exercise __________ times a day. Scapular retraction 1. Stand with your arms at your sides. Look straight ahead. 2. Slowly pull both shoulders (scapulae) backward  and downward (retraction) until you feel a stretch between your shoulder blades in your upper back. 3. Hold for 10-30 seconds. 4. Relax and repeat. Repeat __________ times. Complete this exercise __________ times a day. Contact a health care provider if:  Your neck pain or discomfort gets much worse when you do an exercise.  Your neck pain or discomfort does not improve within 2 hours after you exercise. If you have any of these problems, stop exercising right away. Do not do the exercises again unless your health care provider says that you can. Get help right away if:  You develop sudden, severe neck pain. If this happens, stop exercising right away. Do not do the exercises again unless your health care provider says that you can. This information is not intended to replace advice given to you by your health care provider. Make sure you discuss any questions you have with your health care provider. Document Released: 09/09/2015 Document Revised: 07/27/2018 Document Reviewed: 07/27/2018 Elsevier Patient Education  2020 [employer]. Back Exercises These exercises help to make your trunk and back strong. They also help to keep the lower back flexible. Doing these exercises can help to prevent back pain or lessen existing pain.  If you have back pain, try to do these exercises 2-3 times each day or as told by your doctor.  As you get better, do the exercises once each day. Repeat the exercises more often as told by your doctor.  To stop back pain from coming back, do the exercises once each day, or as told by your doctor. Exercises Single knee to chest Do these steps 3-5 times in a row for each leg: 1. Lie on your back on a firm bed or the floor with your legs stretched out. 2. Bring one knee to your chest. 3. Grab your knee or thigh with both hands and hold them it in place. 4. Pull on your knee until you feel a gentle stretch in your lower back or buttocks. 5. Keep doing the stretch  for 10-30 seconds. 6. Slowly let go of your leg and straighten it. Pelvic tilt Do these steps 5-10 times in a row: 1. Lie on your back on a firm bed or the floor with your legs stretched out. 2. Bend your knees so they point up to the ceiling. Your feet should be flat on the floor. 3. Tighten your lower belly (abdomen) muscles to press your lower back against the floor. This will make your tailbone point up to the ceiling instead of pointing down to your feet or the floor. 4. Stay in this position for 5-10 seconds while you gently tighten your muscles and breathe evenly. Cat-cow Do these steps until your lower back bends more easily: 1. Get on your hands and knees on a firm surface. Keep your hands under your shoulders, and keep your knees under your hips. You may put padding under your knees. 2. Let your head hang down toward your chest. Tighten (contract) the muscles in  your belly. Point your tailbone toward the floor so your lower back becomes rounded like the back of a cat. 3. Stay in this position for 5 seconds. 4. Slowly lift your head. Let the muscles of your belly relax. Point your tailbone up toward the ceiling so your back forms a sagging arch like the back of a cow. 5. Stay in this position for 5 seconds.  Press-ups Do these steps 5-10 times in a row: 1. Lie on your belly (face-down) on the floor. 2. Place your hands near your head, about shoulder-width apart. 3. While you keep your back relaxed and keep your hips on the floor, slowly straighten your arms to raise the top half of your body and lift your shoulders. Do not use your back muscles. You may change where you place your hands in order to make yourself more comfortable. 4. Stay in this position for 5 seconds. 5. Slowly return to lying flat on the floor.  Bridges Do these steps 10 times in a row: 1. Lie on your back on a firm surface. 2. Bend your knees so they point up to the ceiling. Your feet should be flat on the  floor. Your arms should be flat at your sides, next to your body. 3. Tighten your butt muscles and lift your butt off the floor until your waist is almost as high as your knees. If you do not feel the muscles working in your butt and the back of your thighs, slide your feet 1-2 inches farther away from your butt. 4. Stay in this position for 3-5 seconds. 5. Slowly lower your butt to the floor, and let your butt muscles relax. If this exercise is too easy, try doing it with your arms crossed over your chest. Belly crunches Do these steps 5-10 times in a row: 1. Lie on your back on a firm bed or the floor with your legs stretched out. 2. Bend your knees so they point up to the ceiling. Your feet should be flat on the floor. 3. Cross your arms over your chest. 4. Tip your chin a little bit toward your chest but do not bend your neck. 5. Tighten your belly muscles and slowly raise your chest just enough to lift your shoulder blades a tiny bit off of the floor. Avoid raising your body higher than that, because it can put too much stress on your low back. 6. Slowly lower your chest and your head to the floor. Back lifts Do these steps 5-10 times in a row: 1. Lie on your belly (face-down) with your arms at your sides, and rest your forehead on the floor. 2. Tighten the muscles in your legs and your butt. 3. Slowly lift your chest off of the floor while you keep your hips on the floor. Keep the back of your head in line with the curve in your back. Look at the floor while you do this. 4. Stay in this position for 3-5 seconds. 5. Slowly lower your chest and your face to the floor. Contact a doctor if:  Your back pain gets a lot worse when you do an exercise.  Your back pain does not get better 2 hours after you exercise. If you have any of these problems, stop doing the exercises. Do not do them again unless your doctor says it is okay. Get help right away if:  You have sudden, very bad back pain.  If this happens, stop doing the exercises. Do not do them  again unless your doctor says it is okay. This information is not intended to replace advice given to you by your health care provider. Make sure you discuss any questions you have with your health care provider. Document Released: 10/31/2010 Document Revised: 06/23/2018 Document Reviewed: 06/23/2018 Elsevier Patient Education  2020 [employer].

## 2019-05-17 ENCOUNTER — Other Ambulatory Visit: Payer: 59

## 2019-05-17 ENCOUNTER — Other Ambulatory Visit: Payer: Self-pay

## 2019-05-17 DIAGNOSIS — E039 Hypothyroidism, unspecified: Secondary | ICD-10-CM

## 2019-05-18 LAB — TSH: TSH: 2.05 mIU/L

## 2019-05-18 LAB — T4, FREE: Free T4: 1 ng/dL (ref 0.8–1.8)

## 2019-07-04 MED FILL — LEVOTHYROXINE 50 MCG TABLET: 50 | 30 days supply | Qty: 30 | Fill #1

## 2019-07-20 ENCOUNTER — Telehealth: Payer: Self-pay | Admitting: Physician Assistant

## 2019-07-20 MED ORDER — HYDROCORTISONE ACETATE 25 MG RE SUPP: 25.0000 mg | Freq: Two times a day (BID) | RECTAL | 1 refills | Status: DC

## 2019-07-20 NOTE — Telephone Encounter (Signed)
Patient has history of hemorrhoids, will send in cream for her to use- NEEDS TO GET GOOD RX CARD TO MAKE CHEAP- may need to send to [facility] and I suggest that she get on miralax daily, increase water, increase exercise and get a squatty potty.  Follow up with Dr. Ardis Hughs if not better, may need colonoscopy.

## 2019-07-20 NOTE — Telephone Encounter (Signed)
Patient has been made aware.

## 2019-07-20 NOTE — Telephone Encounter (Signed)
-----   Message from Elenor Quinones, [place] sent at 07/20/2019 11:35 AM EDT ----- Regarding: OFFICE NOTE Contact: [phone] Has hemorrhoids & would like you to call her something into her pharmacy please  [facility]------[place]

## 2019-08-14 MED FILL — VALACYCLOVIR HCL 500 MG TAB: 500 | 45 days supply | Qty: 90 | Fill #0

## 2019-08-15 DIAGNOSIS — R768 Other specified abnormal immunological findings in serum: Secondary | ICD-10-CM | POA: Insufficient documentation

## 2019-08-15 DIAGNOSIS — Z1589 Genetic susceptibility to other disease: Secondary | ICD-10-CM | POA: Insufficient documentation

## 2019-08-15 DIAGNOSIS — E039 Hypothyroidism, unspecified: Secondary | ICD-10-CM | POA: Insufficient documentation

## 2019-08-15 NOTE — Progress Notes (Deleted)
FOLLOW UP  Assessment and Plan:    Continue diet and meds as discussed. Further disposition pending results of labs. Over 30 minutes of exam, counseling, chart review, and critical decision making was performed  Future Appointments  Date Time Provider [place]  08/16/2019 11:15 AM Vicie Mutters, PA-C [facility] None  02/13/2020  9:00 AM Vicie Mutters, PA-C [facility] None     HPI 48 y.o. female  presents for 3 month follow up on hypertension, cholesterol, prediabetes, and vitamin D deficiency.   Her blood pressure {HAS HAS NOT:18834} been controlled at home, today their BP is    BMI is There is no height or weight on file to calculate BMI., she is working on diet and exercise. Wt Readings from Last 3 Encounters:  05/16/19 138 lb 9.6 oz (62.9 kg)  04/21/19 147 lb (66.7 kg)  03/24/19 140 lb (63.5 kg)     She {DOES_DOES NF:[id number] workout. She denies chest pain, shortness of breath, dizziness.   She  {ACTION; IS/IS [id number]  on cholesterol medication and denies myalgias. Her cholesterol {ACTION; IS/IS NOT:[id number]} at goal. The cholesterol last visit was:   Lab Results  Component Value Date   CHOL 159 02/07/2019   HDL 49 (L) 02/07/2019   LDLCALC 90 02/07/2019   TRIG 103 02/07/2019   CHOLHDL 3.2 02/07/2019   She is on thyroid medication. Her medication {WAS/WAS NOT:[phone]::"was not"} changed last visit.   Lab Results  Component Value Date   TSH 2.05 05/17/2019   Patient {ACTION; IS/IS [id number] on Vitamin D supplement.   Lab Results  Component Value Date   VD25OH 26 (L) 02/07/2019     Lab Results  Component Value Date   [id number] 03/15/2019     Current Medications:   Current Outpatient Medications (Endocrine & Metabolic):  .  levothyroxine (SYNTHROID) 50 MCG tablet, Take 1 tablet (50 mcg total) by mouth daily.    Current Outpatient Medications (Analgesics):  .  traMADol (ULTRAM) 50 MG tablet, 1 pill twice daily as needed for  pain   Current Outpatient Medications (Other):  .  hydrocortisone (ANUSOL-HC) 25 MG suppository, Place 1 suppository (25 mg total) rectally 2 (two) times daily.  Medical History:  Past Medical History:  Diagnosis Date  . ANEMIA-IRON DEFICIENCY 05/30/2007  . Anxiety   . Aphthous stomatitis   . Arthritis   . Depression   . Genital herpes   . GERD (gastroesophageal reflux disease)   . Migraines   . Throat pain 07/26/2008   2015 s/p ENT eval    . Von Willebrand disease ([place]) 1992   Allergies:  Allergies  Allergen Reactions  . Diflucan [Fluconazole]     Rash Lip swelling     Review of Systems:  ROS  Family history- Review and unchanged Social history- Review and unchanged Physical Exam: There were no vitals taken for this visit. Wt Readings from Last 3 Encounters:  05/16/19 138 lb 9.6 oz (62.9 kg)  04/21/19 147 lb (66.7 kg)  03/24/19 140 lb (63.5 kg)   General Appearance: Well nourished, in no apparent distress. Eyes: PERRLA, EOMs, conjunctiva no swelling or erythema Sinuses: No Frontal/maxillary tenderness ENT/Mouth: Ext aud canals clear, TMs without erythema, bulging. No erythema, swelling, or exudate on post pharynx.  Tonsils not swollen or erythematous. Hearing normal.  Neck: Supple, thyroid normal.  Respiratory: Respiratory effort normal, BS equal bilaterally without rales, rhonchi, wheezing or stridor.  Cardio: RRR with no MRGs. Brisk peripheral pulses without edema.  Abdomen: Soft, + BS,  Non tender, no guarding, rebound, hernias, masses. Lymphatics: Non tender without lymphadenopathy.  Musculoskeletal: Full ROM, 5/5 strength, {PSY - GAIT AND STATION:22860} gait Skin: Warm, dry without rashes, lesions, ecchymosis.  Neuro: Cranial nerves intact. Normal muscle tone, no cerebellar symptoms. Psych: Awake and oriented X 3, normal affect, Insight and Judgment appropriate.    Vicie Mutters, PA-C 8:25 AM [facility]

## 2019-08-16 ENCOUNTER — Ambulatory Visit: Payer: 59 | Admitting: Physician Assistant

## 2019-09-17 DIAGNOSIS — J019 Acute sinusitis, unspecified: Secondary | ICD-10-CM | POA: Diagnosis not present

## 2019-10-04 ENCOUNTER — Other Ambulatory Visit: Payer: Self-pay | Admitting: Physician Assistant

## 2019-10-04 DIAGNOSIS — E039 Hypothyroidism, unspecified: Secondary | ICD-10-CM

## 2019-10-04 MED FILL — LEVOTHYROXINE 50 MCG TABLET: 50 | 30 days supply | Qty: 30 | Fill #0

## 2019-12-28 MED FILL — LEVOTHYROXINE 50 MCG TABLET: 50 | 30 days supply | Qty: 30 | Fill #1

## 2020-01-24 DIAGNOSIS — D485 Neoplasm of uncertain behavior of skin: Secondary | ICD-10-CM | POA: Diagnosis not present

## 2020-01-25 ENCOUNTER — Encounter: Payer: Self-pay | Admitting: Internal Medicine

## 2020-01-25 DIAGNOSIS — D2271 Melanocytic nevi of right lower limb, including hip: Secondary | ICD-10-CM | POA: Diagnosis not present

## 2020-02-13 ENCOUNTER — Encounter: Payer: 59 | Admitting: Adult Health

## 2020-02-13 ENCOUNTER — Encounter: Payer: 59 | Admitting: Physician Assistant

## 2020-02-21 ENCOUNTER — Ambulatory Visit: Payer: 59 | Admitting: Physician Assistant

## 2020-02-21 ENCOUNTER — Other Ambulatory Visit: Payer: Self-pay

## 2020-02-21 ENCOUNTER — Encounter: Payer: Self-pay | Admitting: Physician Assistant

## 2020-02-21 ENCOUNTER — Ambulatory Visit
Admission: RE | Admit: 2020-02-21 | Discharge: 2020-02-21 | Disposition: A | Discharge status: Home / Self Care | Payer: 59 | Source: Ambulatory Visit | Attending: Physician Assistant | Admitting: Physician Assistant

## 2020-02-21 VITALS — BP 128/88 | HR 77 | Temp 97.5°F | Ht 64.0 in | Wt 143.0 lb

## 2020-02-21 DIAGNOSIS — Z1389 Encounter for screening for other disorder: Secondary | ICD-10-CM

## 2020-02-21 DIAGNOSIS — Z0000 Encounter for general adult medical examination without abnormal findings: Secondary | ICD-10-CM

## 2020-02-21 DIAGNOSIS — Z0001 Encounter for general adult medical examination with abnormal findings: Secondary | ICD-10-CM

## 2020-02-21 DIAGNOSIS — Z136 Encounter for screening for cardiovascular disorders: Secondary | ICD-10-CM | POA: Diagnosis not present

## 2020-02-21 DIAGNOSIS — I10 Essential (primary) hypertension: Secondary | ICD-10-CM | POA: Diagnosis not present

## 2020-02-21 DIAGNOSIS — J449 Chronic obstructive pulmonary disease, unspecified: Secondary | ICD-10-CM | POA: Diagnosis not present

## 2020-02-21 DIAGNOSIS — E039 Hypothyroidism, unspecified: Secondary | ICD-10-CM

## 2020-02-21 DIAGNOSIS — F3341 Major depressive disorder, recurrent, in partial remission: Secondary | ICD-10-CM

## 2020-02-21 DIAGNOSIS — E559 Vitamin D deficiency, unspecified: Secondary | ICD-10-CM | POA: Diagnosis not present

## 2020-02-21 DIAGNOSIS — Z720 Tobacco use: Secondary | ICD-10-CM | POA: Diagnosis not present

## 2020-02-21 DIAGNOSIS — I700 Atherosclerosis of aorta: Secondary | ICD-10-CM | POA: Diagnosis not present

## 2020-02-21 DIAGNOSIS — D509 Iron deficiency anemia, unspecified: Secondary | ICD-10-CM

## 2020-02-21 DIAGNOSIS — E538 Deficiency of other specified B group vitamins: Secondary | ICD-10-CM | POA: Diagnosis not present

## 2020-02-21 DIAGNOSIS — F17210 Nicotine dependence, cigarettes, uncomplicated: Secondary | ICD-10-CM | POA: Diagnosis not present

## 2020-02-21 DIAGNOSIS — M542 Cervicalgia: Secondary | ICD-10-CM

## 2020-02-21 DIAGNOSIS — K2100 Gastro-esophageal reflux disease with esophagitis, without bleeding: Secondary | ICD-10-CM

## 2020-02-21 DIAGNOSIS — F411 Generalized anxiety disorder: Secondary | ICD-10-CM

## 2020-02-21 DIAGNOSIS — Z79899 Other long term (current) drug therapy: Secondary | ICD-10-CM

## 2020-02-21 DIAGNOSIS — R5383 Other fatigue: Secondary | ICD-10-CM

## 2020-02-21 MED ORDER — TRAMADOL HCL 50 MG PO TABS: 100.0000 mg | ORAL_TABLET | Freq: Four times a day (QID) | ORAL | 0 refills | Status: DC | PRN

## 2020-02-21 NOTE — Progress Notes (Signed)
Complete Physical  Assessment and Plan: Fatigue ? Need sleep study rule out OSA/nonREM sleep- patient is still tired in the morning Check thyroid- will try brand name- if not better we have no issues referring for second opinion.  Check other labs Neg lyme, neg celiac, negative autoimmune Lifestyle modification discussed ? anxiety  Encounter for general adult medical examination with abnormal findings  Depression, major, recurrent, in partial remission ([place])  Tobacco abuse disorder -     EKG 12-Lead -     DG Chest 2 View; Future  Chronic obstructive pulmonary disease, unspecified COPD type ([place]) No triggers, well controlled symptoms, cont to monitor  Atherosclerosis of aorta (H[facility]) -     Lipid panel -     Hemoglobin A1c Control blood pressure, cholesterol, glucose, increase exercise.  -     EKG 12-Lead  Hypothyroidism, unspecified type -     TSH -     T4, free Patient feels very very tired, states started with diagnosis of thyroid issue. She is not on a biotin, she is on generic levothyroxine, given brand name to try to see if she does better with this.  Patient wants to see endocrinologist   Vitamin D deficiency -     VITAMIN D 25 Hydroxy (Vit-D Deficiency, Fractures)  Gastroesophageal reflux disease with esophagitis without hemorrhage  B12 deficiency -     Vitamin B12  Iron deficiency anemia, unspecified iron deficiency anemia type  Anxiety state  Hypertension, unspecified type -     CBC with Differential/Platelet -     COMPLETE METABOLIC PANEL WITH GFR  Screening for blood or protein in urine -     Urinalysis, Routine w reflex microscopic -     Microalbumin / creatinine urine ratio  Medication management -     Magnesium  Neck pain on right side -     Discontinue: traMADol (ULTRAM) 50 MG tablet; Take 2 tablets (100 mg total) by mouth every 6 (six) hours as needed for up to 5 days for moderate pain or severe pain. 1 pill twice daily as needed for  pain   Iron deficiency anemia, unspecified iron deficiency anemia type -     Iron,Total/Total Iron Binding Cap - monitor, continue iron supp with Vitamin C and increase green leafy veggies  Depression, major, recurrent, in partial remission (HCC) -     TSH - continue medications, stress management techniques discussed, increase water, good sleep hygiene discussed, increase exercise, and increase veggies.   B12 deficiency -     Vitamin B12 - get on B12 supplement  Vitamin D deficiency -     VITAMIN D 25 Hydroxy (Vit-D Deficiency, Fractures)  Occipital neuralgia of left side Monitor  Constipation, unspecified constipation type Following up with Dr Ardis Hughs, do miralax daily, start on it  History of hemorrhoids Follow up Dr. Ardis Hughs  Genital herpes simplex, unspecified site Monitor  Gastroesophageal reflux disease with esophagitis Continue PPI/H2 blocker, diet discussed  Encounter for general adult medical examination with abnormal findings 1 year  Medication management -     CBC with Differential/Platelet -     COMPLETE METABOLIC PANEL WITH GFR -     Magnesium  Screening cholesterol level -     Lipid panel  Screening for hematuria or proteinuria -     Urinalysis, Routine w reflex microscopic -     Microalbumin / creatinine urine ratio  Discussed med's effects and SE's. Screening labs and tests as requested with regular follow-up as recommended. Over 79  minutes of exam, counseling, chart review and critical decision making was performed  HPI  This very nice 49 y.o.female presents for complete physical.    Her blood pressure has been controlled at home, today their BP is BP: 128/88  She does not workout, she was walking her dogs. She denies chest pain, shortness of breath, dizziness.  She is not on cholesterol medication and denies myalgias. Her cholesterol is at goal. The cholesterol last visit was:   Lab Results  Component Value Date   CHOL 159 02/07/2019   HDL  49 (L) 02/07/2019   LDLCALC 90 02/07/2019   TRIG 103 02/07/2019   CHOLHDL 3.2 02/07/2019   Last A1C in the office was:  Lab Results  Component Value Date   HGBA1C 4.9 06/17/2016   She complains of being tired still. She saw Dr. Estil Daft for + ANA and HLB27 but has negative xrays and no signs of autoimmune OA at this time.  Negative celiac, negative lyme.   She has started back to smoking. stopped 2017, started back 2021.  SheShe switched from 3rd to 1st shift, this did not not help. She goes to sleep at 9 AM, she will think a lot before she goes to bed and have a hard time getting to sleep, can take 1-2 hours, gets up at 2AM to give her dog their seizure med, leaves for work at [employer] due to fear of traffic and gets to work at [employer], to start work at [employer], she plays pet rescue in the care. She wakes up and still feels tired.   She states she will walk her dog once a week. She had to run in the rain and she has severe pain all over. With exertion, like mowing the grass, she will have SOB and pain everywhere. Her ankles, hands, etc.  She denies CP, fever, chills. She states she does not want to do the things she enjoys like mowing the grass or walking her dog.    Patient is on Vitamin D supplement, she is on 1000 IU of vitamin D Lab Results  Component Value Date   VD25OH 71 (L) 02/07/2019     Was on B12 injections in the past, she is on B12 sublingual Lab Results  Component Value Date   VITAMINB12 358 03/15/2019   She is on thyroid medication. Her medication was changed last visit, she is 1/2 daily but Tues/Thurs 1 pill. She is not on biotin supplement.    Lab Results  Component Value Date   TSH 2.05 05/17/2019  .  BMI is Body mass index is 24.55 kg/m., she is working on diet and exercise. Wt Readings from Last 3 Encounters:  02/21/20 143 lb (64.9 kg)  05/16/19 138 lb 9.6 oz (62.9 kg)  04/21/19 147 lb (66.7 kg)     Current Medications:  Current Outpatient Medications on File  Prior to Visit  Medication Sig Dispense Refill  . levothyroxine (SYNTHROID) 50 MCG tablet Take 1 tablet daily on an empty stomach with only water for 30 minutes & no Antacid meds, Calcium or Magnesium for 4 hours & avoid Biotin 30 tablet 1   No current facility-administered medications on file prior to visit.   Health Maintenance:   Immunization History  Administered Date(s) Administered  . Influenza Split 07/18/2012  . Influenza-Unspecified 07/11/2013   TD/TDAP: will get this visit Influenza: gets at work 2020 Pneumovax: N/A Prevnar 13: N/A COVID vaccines: had [place] in Dec  LMP: No LMP recorded. Pap:  Has GYN, Joycelyn Rua MGM: 03/2019 due in June/July Colonoscopy 2015 Dr. Ardis Hughs EGD 03/2019 Xray cervial 03/2013 [place] 2018 normal   Allergies:  Allergies  Allergen Reactions  . Diflucan [Fluconazole]     Rash Lip swelling   Medical History:  has Iron deficiency anemia; Anxiety state; Depression, major, recurrent, in partial remission ([place]); GERD (gastroesophageal reflux disease); Genital herpes; B12 deficiency; Vitamin D deficiency; Occipital neuralgia; Neck pain on right side; Headache, tension-type; Tobacco abuse disorder; COPD (chronic obstructive pulmonary disease) ([place]); Atherosclerosis of aorta ([place]); Constipation; History of hemorrhoids; ANA positive; HLA B27 (HLA B27 positive); and Hypothyroidism on their problem list. Surgical History:  She  has a past surgical history that includes Nose surgery (1991). Family History:  Her family history includes Arthritis in her brother; Breast cancer in her cousin; Cancer in her paternal uncle; Colon cancer in her paternal aunt; Fibromyalgia in her mother; Healthy in her daughter and son; Heart disease in her maternal grandfather; Hypertension in her father and mother; Thyroid disease in her brother. Social History:   reports that she quit smoking about 3 years ago. Her smoking use included cigarettes. She has a 22.50 pack-year  smoking history. She has never used smokeless tobacco. She reports that she does not drink alcohol or use drugs.  Review of Systems: Review of Systems  Constitutional: Negative for chills, diaphoresis, fever, malaise/fatigue and weight loss.  HENT: Negative for congestion, ear discharge, ear pain, hearing loss, nosebleeds, sore throat and tinnitus.   Eyes: Negative.  Negative for blurred vision and double vision.  Respiratory: Negative.  Negative for stridor.   Cardiovascular: Negative.  Negative for chest pain.  Gastrointestinal: Negative.   Genitourinary: Negative.   Musculoskeletal: Positive for neck pain. Negative for back pain, falls, joint pain and myalgias.  Skin: Negative.   Neurological: Negative.  Negative for weakness and headaches.  Endo/Heme/Allergies: Negative.   Psychiatric/Behavioral: Negative.     Physical Exam: Estimated body mass index is 24.55 kg/m as calculated from the following:   Height as of this encounter: 5' 4"  (1.626 m).   Weight as of this encounter: 143 lb (64.9 kg). BP 128/88   Pulse 77   Temp (!) 97.5 F (36.4 C)   Ht 5' 4"  (1.626 m)   Wt 143 lb (64.9 kg)   SpO2 98%   BMI 24.55 kg/m  General Appearance: Well nourished, in no apparent distress.  Eyes: PERRLA, EOMs, conjunctiva no swelling or erythema, normal fundi and vessels.  Sinuses: No Frontal/maxillary tenderness  ENT/Mouth: Ext aud canals clear, normal light reflex with TMs without erythema, bulging but + bilateral effusions, + TMJ right worse than left. Good dentition without abscesses. No erythema, swelling, or exudate on post pharynx. Tonsils not swollen or erythematous. Hearing normal.  Neck: Supple, thyroid normal. No bruits  Respiratory: Respiratory effort normal, BS equal bilaterally without rales, rhonchi, wheezing or stridor.  Cardio: RRR without murmurs, rubs or gallops. Brisk peripheral pulses without edema.  Chest: symmetric, with normal excursions and percussion.  Breasts:defer   Abdomen: Soft, nontender, no guarding, rebound, hernias, masses, or organomegaly.  Lymphatics: Non tender without lymphadenopathy.  Genitourinary: defer Musculoskeletal: Full ROM all peripheral extremities,5/5 strength, and normal gait. Very tender right SCM/traps, no radicular symptoms.  Skin: Warm, dry without rashes, lesions, ecchymosis. Neuro: Cranial nerves intact, reflexes equal bilaterally. Normal muscle tone, no cerebellar symptoms. Sensation intact.  Psych: Awake and oriented X 3, normal affect, Insight and Judgment appropriate.   EKG: WNL, no ST changes  Estill Bamberg  [place] Lay 2:22 PM [facility]

## 2020-02-21 NOTE — Patient Instructions (Addendum)
INFORMATION ABOUT YOUR XRAY  Can walk into [address] for an Insurance account manager. They will have the order and take you back. You do not any paper work, I should get the result back today or tomorrow. This order is good for a year.  Can call [phone] to schedule an appointment if you wish.     VENOUS INSUFFICIENCY Our lower leg venous system is not the most reliable, the heart does NOT pump fluid up, there is a valve system.  The muscles of the leg squeeze and the blood moves up and a valve opens and close, then they squeeze, blood moves up and valves open and closes keeping the blood moving towards the heart.  Lots can go wrong with this valve system.  If someone is sitting or standing without movement, everyone will get swelling.  THINGS TO DO:  Do not stand or sit in one position for long periods of time. Do not sit with your legs crossed. Rest with your legs raised during the day.  Your legs have to be higher than your heart so that gravity will force the valves to open, so please really elevate your legs.   Wear elastic stockings or support hose. Do not wear other tight, encircling garments around the legs, pelvis, or waist.  ELASTIC THERAPY  has a wide variety of well priced compression stockings. [place], [place] [id number] [address] [place] AND GETTING MEASURED  COMPRESSION SOCKS ARE ONLY GOOD FOR A YEAR  Walk as much as possible to increase blood flow.  Raise the foot of your bed at night with 2-inch blocks.  SEEK MEDICAL CARE IF:   The skin around your ankle starts to break down.  You have pain, redness, tenderness, or hard swelling developing in your leg over a vein.  You are uncomfortable due to leg pain.  If you ever have shortness of breath with exertion or chest pain go to the ER.    Check out [employer] Light therapy Lamp, Ultra Thin UV Free 10000 Lux therapy Light on Amazon- 35-40 dollars.    Make sure  you are on your vitamin D   Managing Seasonal Affective Disorder Seasonal affective disorder (SAD) is a type of depression. A person who has SAD feels depressed at specific times of the year. The most common times for this condition are late fall and winter. How to manage lifestyle changes Managing stress 1. Get 6-8 hours of sleep every night. To improve your sleep, make sure you: ? Keep your bedroom dark and cool. ? Go to sleep and wake up at about the same time every day. ? Limit screen time starting a few hours before bedtime. 2. Exercise regularly. 3. Avoid making major decisions until you feel better. 4. Practice techniques to manage your stress, such as: ? Mindfulness-based meditation. ? Focused breathing exercises. ? Relaxation exercises. ? Journaling. 5. Find activities that help you relax, like reading, getting a massage, or spending time with a friend. 6. Do mind-body exercises, like yoga or tai chi. Managing treatment    Treatment for SAD includes talk therapy, light therapy, combination of both talk and light therapy, and medicines for depression (antidepressants). If you take medicine for SAD, avoid using alcohol and other substances that may prevent your medicine from working properly. It is also important to:  Talk with your pharmacist or health care provider about all the medicines that you take, their possible side effects, and  what medicines are safe to take together.  Ask to be referred to a therapist who can help with SAD.  Use a light box for 30 minutes every morning. This should start in the early fall and continue until spring.  Take part in all treatment decisions (shared decision-making). Give input on the side effects of medicines. Shared decision-making should be part of your total treatment plan.  Know when you can expect to start feeling better. Antidepressants take a while to start working. Let your health care provider know if you are not feeling better  after the expected time.  Make sure you know all the side effects of treatment. Know which ones are serious enough to call your health care provider about.   Relationships  Educate your friends and family members about SAD so they can understand how it affects you.  Ask for support from friends and family members to help you manage SAD. Consider asking family members to participate in family therapy.  Consider joining a support group. This can help you express yourself and learn how others manage SAD. General instructions  Do your best to stay positive by remembering that SAD usually goes away after a few months.  Work with your health care providers on a treatment plan. Stick to your plan. Make a plan that includes routines such as talk therapy and light therapy.  Make your home and work environment sunny or bright. Open window blinds. Spend as much time as possible outside. Move furniture closer to windows for exposure to natural light. How to recognize changes in your condition As your SAD improves, you may:  Start to feel your mood get better.  Have more energy for daily activities.  Get back to your normal routines for sleeping and eating.  Think more clearly and be less agitated.  Feel better about yourself.  Feel joy and interest return. If your SAD gets worse, you may:  Avoid people and activities.  Feel guilty and hopeless.  Not eat well, sleep well, or exercise. Follow these instructions at home:  Use a light box and spend as much time in natural daylight as you can.  Stay busy with activities that get you out of the house. Do activities that you enjoy. Consider making plans for a daily walk with friends or family.  Eat healthy foods, including plenty of fruits and vegetables. Limit foods that contain a lot of fat or sugar. Do not binge on sweets and other carbs.  Limit alcohol and caffeine as told by your health care provider.  Keep all follow-up visits as  told by your health care provider. This is important. Where to find support Talking to others Consider joining an online or in-person support group for people with SAD. You can also talk with:  A therapist.  Your health care provider.  Friends and family.  Online support groups at [employer].uy. Finances 1. Check with your insurance to make sure that treatment for SAD is covered. 2. If you have a problem paying for co-pays or counseling, ask to meet with a Education officer, museum for help. 3. If your medicines are expensive: ? Use a generic form of the medicine. This may be less expensive than a brand name medicine. ? Check to see whether the manufacturer offers assistance programs to patients who cannot afford their medicines. Where to find more information  American Psychological Association: https://taylor.info/.aspx  American Psychiatric Association: psychiatry.org/patients-families/depression/seasonal-affective-disorder  [employer] of Mental Health: ReserveSpaces.se.shtml Contact a health care provider if:  Your symptoms get worse or do not get better.  You have trouble taking care of yourself.  You are using drugs or alcohol to manage your symptoms.  You have side effects from medicines.  Your symptoms return. Get help right away if:  You have thoughts about hurting yourself or others. If you ever feel like you may hurt yourself or others, or have thoughts about taking your own life, get help right away. You can go to your nearest emergency department or call:  Your local emergency services (911 in the [place]  A suicide crisis helpline, such as the [place] at (434) 285-1411. This is open 24 hours a day. Summary  SAD is a type of depression that typically occurs during the fall and winter months when there is less daylight. It is rare but possible for SAD to  happen during the summer.  SAD causes symptoms of depression that can range from mild to severe.  Lifestyle changes are an important part of managing SAD. Make a plan that includes routines to help you stay active and focused.  Follow your health care provider's treatment plan, which may include talk therapy, light therapy, or use of medicines.  Get help right away if you feel like you might hurt yourself or others, or have thoughts of taking your own life. Call your local emergency line (911 in the [place] or the National Suicide Prevention Lifeline at 705-396-0558. This is open 24 hours a day. This information is not intended to replace advice given to you by your health care provider. Make sure you discuss any questions you have with your health care provider. Document Revised: 01/20/2019 Document Reviewed: 01/05/2018 Elsevier Patient Education  [employer].

## 2020-02-22 ENCOUNTER — Telehealth: Payer: Self-pay | Admitting: Physician Assistant

## 2020-02-22 DIAGNOSIS — M542 Cervicalgia: Secondary | ICD-10-CM

## 2020-02-22 LAB — HEMOGLOBIN A1C
Hgb A1c MFr Bld: 4.9 % of total Hgb (ref <5.7)
Mean Plasma Glucose: 94 (calc)
eAG (mmol/L): 5.2 (calc)

## 2020-02-22 LAB — CBC WITH DIFFERENTIAL/PLATELET
Absolute Monocytes: 420 cells/uL (ref 200–950)
Basophils Absolute: 42 cells/uL (ref 0–200)
Basophils Relative: 0.7 %
Eosinophils Absolute: 72 cells/uL (ref 15–500)
Eosinophils Relative: 1.2 %
HCT: 44 % (ref 35.0–45.0)
Hemoglobin: 14.3 g/dL (ref 11.7–15.5)
Lymphs Abs: 1680 cells/uL (ref 850–3900)
MCH: 29.4 pg (ref 27.0–33.0)
MCHC: 32.5 g/dL (ref 32.0–36.0)
MCV: 90.3 fL (ref 80.0–100.0)
MPV: 11.1 fL (ref 7.5–12.5)
Monocytes Relative: 7 %
Neutro Abs: 3786 cells/uL (ref 1500–7800)
Neutrophils Relative %: 63.1 %
Platelets: 214 10*3/uL (ref 140–400)
RBC: 4.87 10*6/uL (ref 3.80–5.10)
RDW: 12.1 % (ref 11.0–15.0)
Total Lymphocyte: 28 %
WBC: 6 10*3/uL (ref 3.8–10.8)

## 2020-02-22 LAB — LIPID PANEL
Cholesterol: 178 mg/dL (ref <200)
HDL: 47 mg/dL — ABNORMAL LOW (ref >=50)
LDL Cholesterol (Calc): 99 mg/dL (calc)
Non-HDL Cholesterol (Calc): 131 mg/dL (calc) — ABNORMAL HIGH (ref <130)
Total CHOL/HDL Ratio: 3.8 (calc) (ref <5.0)
Triglycerides: 196 mg/dL — ABNORMAL HIGH (ref <150)

## 2020-02-22 LAB — MICROALBUMIN / CREATININE URINE RATIO
Creatinine, Urine: 142 mg/dL (ref 20–275)
Microalb Creat Ratio: 3 mcg/mg creat (ref <30)
Microalb, Ur: 0.4 mg/dL

## 2020-02-22 LAB — URINALYSIS, ROUTINE W REFLEX MICROSCOPIC
Bilirubin Urine: NEGATIVE
Glucose, UA: NEGATIVE
Hgb urine dipstick: NEGATIVE
Ketones, ur: NEGATIVE
Leukocytes,Ua: NEGATIVE
Nitrite: NEGATIVE
Protein, ur: NEGATIVE
Specific Gravity, Urine: 1.018 (ref 1.001–1.03)
pH: 7 (ref 5.0–8.0)

## 2020-02-22 LAB — COMPLETE METABOLIC PANEL WITH GFR
AG Ratio: 2.1 (calc) (ref 1.0–2.5)
ALT: 12 U/L (ref 6–29)
AST: 15 U/L (ref 10–35)
Albumin: 3.9 g/dL (ref 3.6–5.1)
Alkaline phosphatase (APISO): 39 U/L (ref 31–125)
BUN: 7 mg/dL (ref 7–25)
CO2: 28 mmol/L (ref 20–32)
Calcium: 8.8 mg/dL (ref 8.6–10.2)
Chloride: 105 mmol/L (ref 98–110)
Creat: 0.54 mg/dL (ref 0.50–1.10)
GFR, Est African American: 129 mL/min/{1.73_m2} (ref >=60)
GFR, Est Non African American: 112 mL/min/{1.73_m2} (ref >=60)
Globulin: 1.9 g/dL (calc) (ref 1.9–3.7)
Glucose, Bld: 87 mg/dL (ref 65–99)
Potassium: 4.7 mmol/L (ref 3.5–5.3)
Sodium: 138 mmol/L (ref 135–146)
Total Bilirubin: 0.4 mg/dL (ref 0.2–1.2)
Total Protein: 5.8 g/dL — ABNORMAL LOW (ref 6.1–8.1)

## 2020-02-22 LAB — VITAMIN B12: Vitamin B-12: >2000 pg/mL — ABNORMAL HIGH (ref 200–1100)

## 2020-02-22 LAB — T4, FREE: Free T4: 1 ng/dL (ref 0.8–1.8)

## 2020-02-22 LAB — MAGNESIUM: Magnesium: 2 mg/dL (ref 1.5–2.5)

## 2020-02-22 LAB — TSH: TSH: 2.02 mIU/L

## 2020-02-22 LAB — VITAMIN D 25 HYDROXY (VIT D DEFICIENCY, FRACTURES): Vit D, 25-Hydroxy: 41 ng/mL (ref 30–100)

## 2020-02-22 MED ORDER — TRAMADOL HCL 50 MG PO TABS: 100.0000 mg | ORAL_TABLET | Freq: Four times a day (QID) | ORAL | 0 refills | Status: DC | PRN

## 2020-02-22 NOTE — Telephone Encounter (Signed)
-----   Message from Elenor Quinones, [place] sent at 02/21/2020  3:10 PM EDT ----- Regarding: pharmacy request Contact: [phone] ERIC:[id number]  Tramadol rx the directions are unclear Please resend the Rx with directions on how to take

## 2020-03-25 ENCOUNTER — Other Ambulatory Visit: Payer: Self-pay

## 2020-03-25 DIAGNOSIS — E039 Hypothyroidism, unspecified: Secondary | ICD-10-CM

## 2020-03-25 MED ORDER — LEVOTHYROXINE SODIUM 50 MCG PO TABS: ORAL_TABLET | ORAL | 3 refills | Status: DC

## 2020-03-25 MED FILL — LEVOTHYROXINE 50 MCG TABLET: 50 | 30 days supply | Qty: 30 | Fill #0

## 2020-03-26 DIAGNOSIS — L811 Chloasma: Secondary | ICD-10-CM | POA: Diagnosis not present

## 2020-03-26 DIAGNOSIS — L814 Other melanin hyperpigmentation: Secondary | ICD-10-CM | POA: Diagnosis not present

## 2020-03-26 DIAGNOSIS — L819 Disorder of pigmentation, unspecified: Secondary | ICD-10-CM | POA: Diagnosis not present

## 2020-03-26 DIAGNOSIS — L720 Epidermal cyst: Secondary | ICD-10-CM | POA: Diagnosis not present

## 2020-03-26 DIAGNOSIS — L821 Other seborrheic keratosis: Secondary | ICD-10-CM | POA: Diagnosis not present

## 2020-03-26 DIAGNOSIS — D229 Melanocytic nevi, unspecified: Secondary | ICD-10-CM | POA: Diagnosis not present

## 2020-03-27 ENCOUNTER — Telehealth: Payer: Self-pay

## 2020-03-27 DIAGNOSIS — E039 Hypothyroidism, unspecified: Secondary | ICD-10-CM

## 2020-03-27 MED ORDER — LEVOTHYROXINE SODIUM 50 MCG PO TABS: ORAL_TABLET | ORAL | 3 refills | Status: DC

## 2020-03-27 NOTE — Telephone Encounter (Signed)
Is she feeling better or doing better on the brand name thyroid medication? That was to test to see if she was doing better. I will put in a referral to Dr. Chalmers Cater.   Can come in to look at the spots and check hormones if she wishes.  Need to request derm notes.

## 2020-03-27 NOTE — Addendum Note (Signed)
Addended by: Vicie Mutters R on: 03/27/2020 11:25 AM   Modules accepted: Orders

## 2020-03-27 NOTE — Telephone Encounter (Signed)
Patient states that the thyroid Rx was suppose to be brand name so that was fixed, but she also stated that she has Brown spots on her face(bad). Went to [facility] & was told that this was from her Hormones so patient would like to know if she needs her levels checked.    She also states that you wanted her to call with the Dr. She would like to see: DR. BALAN.

## 2020-03-28 NOTE — Telephone Encounter (Signed)
Spoke with patient and is scheduled to be seen on Monday

## 2020-04-01 ENCOUNTER — Ambulatory Visit: Payer: 59 | Admitting: Physician Assistant

## 2020-04-01 ENCOUNTER — Other Ambulatory Visit: Payer: Self-pay

## 2020-04-01 ENCOUNTER — Encounter: Payer: Self-pay | Admitting: Physician Assistant

## 2020-04-01 VITALS — BP 102/60 | HR 68 | Temp 97.5°F | Wt 142.4 lb

## 2020-04-01 DIAGNOSIS — E8881 Metabolic syndrome: Secondary | ICD-10-CM

## 2020-04-01 DIAGNOSIS — E039 Hypothyroidism, unspecified: Secondary | ICD-10-CM

## 2020-04-01 DIAGNOSIS — R871 Abnormal level of hormones in specimens from female genital organs: Secondary | ICD-10-CM | POA: Diagnosis not present

## 2020-04-01 DIAGNOSIS — R238 Other skin changes: Secondary | ICD-10-CM | POA: Diagnosis not present

## 2020-04-01 MED FILL — SYNTHROID 50 MCG TABLET: 50 | 30 days supply | Qty: 30 | Fill #0

## 2020-04-01 NOTE — Patient Instructions (Signed)
Will get in contact with Dr. Chalmers Cater and resummit the referral.   Will check hormone levels.

## 2020-04-01 NOTE — Progress Notes (Signed)
Subjective:    Patient ID: Carmen Hoffman, female    DOB: 09/16/1971, 49 y.o.   MRN: [id number]  HPI 49 y.o. WF presents with areas on her face that she wants evaluated, likely melasma. She saw derm and they told her it was hormonal so she would like her hormones checked. We do not have her noted from that visit.   She is tearful, on two creams for the malasma. Afraid it will not go away. Still having menses, just stopped LMP, they are regular. No chance of pregnancy.   Referral was placed on 03/27/2020 for Dr. Chalmers Cater and Katrina wrote a note that she hand delivered the papers to her office but the patient states that Dr. Almetta Lovely office called and she has not gotten a referral. Please check on that or resummit information. She has been on brand name thyroid medication, states she has felt better on the 15mg brand name but her insurance messed it up last time and the samples given to her were expired, will give samples today and confirm the referral.    Lab Results  Component Value Date   TSH 2.02 02/21/2020    Blood pressure 102/60, pulse 68, temperature (!) 97.5 F (36.4 C), weight 142 lb 6.4 oz (64.6 kg), SpO2 98 %.  Medications  Current Outpatient Medications (Endocrine & Metabolic):  .  levothyroxine (SYNTHROID) 50 MCG tablet, Take 1 tablet daily on an empty stomach with only water for 30 minutes & no Antacid meds, Calcium or Magnesium for 4 hours & avoid Biotin      Current Outpatient Medications (Other):  .  ergocalciferol (VITAMIN D2) 1.25 MG (50000 UT) capsule, ergocalciferol (vitamin D2) 1,250 mcg (50,000 unit) capsule .  hydroquinone 4 % cream,  .  tretinoin (RETIN-A) 0.025 % cream, SMARTSIG:1 Sparingly Topical Daily .  valACYclovir (VALTREX) 500 MG tablet, valacyclovir 500 mg tablet  TAKE 1 TABLET BY MOUTH ONCE DAILY FOR SUPPRESSION. TAKE TWICE DAILY FOR 3 DAYS FOR OUTBREAK .  mupirocin ointment (BACTROBAN) 2 %, mupirocin 2 % topical ointment  APP BID (Patient not  taking: Reported on 04/01/2020)  Problem list She has Iron deficiency anemia; Anxiety state; Depression, major, recurrent, in partial remission ([place]; GERD (gastroesophageal reflux disease); Genital herpes; B12 deficiency; Vitamin D deficiency; Occipital neuralgia; Neck pain on right side; Headache, tension-type; Tobacco abuse disorder; COPD (chronic obstructive pulmonary disease) [place]; Atherosclerosis of aorta ([place]; Constipation; History of hemorrhoids; ANA positive; HLA B27 (HLA B27 positive); and Hypothyroidism on their problem list.    Review of Systems      Objective:   Physical Exam Constitutional:      Appearance: She is well-developed.  HENT:     Head: Normocephalic and atraumatic.     Right Ear: External ear normal.     Left Ear: External ear normal.  Eyes:     Conjunctiva/sclera: Conjunctivae normal.     Pupils: Pupils are equal, round, and reactive to light.  Neck:     Thyroid: No thyromegaly.  Cardiovascular:     Rate and Rhythm: Normal rate and regular rhythm.     Heart sounds: Normal heart sounds. No murmur heard.  No friction rub. No gallop.   Pulmonary:     Effort: Pulmonary effort is normal. No respiratory distress.     Breath sounds: Normal breath sounds. No wheezing.  Abdominal:     General: Bowel sounds are normal. There is no distension.     Palpations: Abdomen is soft. There is no mass.  Tenderness: There is no abdominal tenderness. There is no guarding or rebound.  Musculoskeletal:        General: Normal range of motion.     Cervical back: Normal range of motion and neck supple.  Lymphadenopathy:     Cervical: No cervical adenopathy.  Skin:    General: Skin is warm and dry.     Comments: Malasma/brown macules bilateral chin/face, nothing along axilla or inguinal area  Neurological:     Mental Status: She is alert and oriented to person, place, and time.     Cranial Nerves: No cranial nerve deficit.     Coordination: Coordination normal.      Deep Tendon Reflexes: Reflexes normal.        Assessment & Plan:    Abnormal skin color May need referral GYN or further pelvic evaluation- normal menses Continue to follow up with derm- using two creams at this time -     Testosterone, Total, LC/MS/MS -     Follicle stimulating hormone -     Estradiol -     Progesterone  Hypothyroidism, unspecified type -     CBC with Differential/Platelet -     COMPLETE METABOLIC PANEL WITH GFR -     TSH  Insulin resistance -     Insulin, random    Vitamin C topical Skin protection

## 2020-04-02 DIAGNOSIS — H5213 Myopia, bilateral: Secondary | ICD-10-CM | POA: Diagnosis not present

## 2020-04-02 DIAGNOSIS — H52203 Unspecified astigmatism, bilateral: Secondary | ICD-10-CM | POA: Diagnosis not present

## 2020-04-02 DIAGNOSIS — H524 Presbyopia: Secondary | ICD-10-CM | POA: Diagnosis not present

## 2020-04-03 LAB — CBC WITH DIFFERENTIAL/PLATELET
Absolute Monocytes: 370 cells/uL (ref 200–950)
Basophils Absolute: 39 cells/uL (ref 0–200)
Basophils Relative: 0.7 %
Eosinophils Absolute: 78 cells/uL (ref 15–500)
Eosinophils Relative: 1.4 %
HCT: 37.3 % (ref 35.0–45.0)
Hemoglobin: 12.7 g/dL (ref 11.7–15.5)
Lymphs Abs: 1725 cells/uL (ref 850–3900)
MCH: 30.9 pg (ref 27.0–33.0)
MCHC: 34 g/dL (ref 32.0–36.0)
MCV: 90.8 fL (ref 80.0–100.0)
MPV: 11.1 fL (ref 7.5–12.5)
Monocytes Relative: 6.6 %
Neutro Abs: 3388 cells/uL (ref 1500–7800)
Neutrophils Relative %: 60.5 %
Platelets: 191 10*3/uL (ref 140–400)
RBC: 4.11 10*6/uL (ref 3.80–5.10)
RDW: 11.8 % (ref 11.0–15.0)
Total Lymphocyte: 30.8 %
WBC: 5.6 10*3/uL (ref 3.8–10.8)

## 2020-04-03 LAB — COMPLETE METABOLIC PANEL WITH GFR
AG Ratio: 1.9 (calc) (ref 1.0–2.5)
ALT: 7 U/L (ref 6–29)
AST: 14 U/L (ref 10–35)
Albumin: 4.2 g/dL (ref 3.6–5.1)
Alkaline phosphatase (APISO): 48 U/L (ref 31–125)
BUN: 14 mg/dL (ref 7–25)
CO2: 29 mmol/L (ref 20–32)
Calcium: 9.3 mg/dL (ref 8.6–10.2)
Chloride: 104 mmol/L (ref 98–110)
Creat: 0.64 mg/dL (ref 0.50–1.10)
GFR, Est African American: 122 mL/min/{1.73_m2} (ref >=60)
GFR, Est Non African American: 106 mL/min/{1.73_m2} (ref >=60)
Globulin: 2.2 g/dL (calc) (ref 1.9–3.7)
Glucose, Bld: 104 mg/dL — ABNORMAL HIGH (ref 65–99)
Potassium: 4.3 mmol/L (ref 3.5–5.3)
Sodium: 140 mmol/L (ref 135–146)
Total Bilirubin: 0.3 mg/dL (ref 0.2–1.2)
Total Protein: 6.4 g/dL (ref 6.1–8.1)

## 2020-04-03 LAB — TESTOSTERONE, TOTAL, LC/MS/MS: Testosterone, Total, LC-MS-MS: 9 ng/dL (ref 2–45)

## 2020-04-03 LAB — TSH: TSH: 1.44 mIU/L

## 2020-04-03 LAB — ESTRADIOL: Estradiol: 65 pg/mL

## 2020-04-03 LAB — INSULIN, RANDOM: Insulin: 23.4 u[IU]/mL — ABNORMAL HIGH

## 2020-04-03 LAB — PROGESTERONE: Progesterone: <0.5 ng/mL

## 2020-04-03 LAB — FOLLICLE STIMULATING HORMONE: FSH: 18.1 m[IU]/mL

## 2020-04-05 ENCOUNTER — Telehealth: Payer: Self-pay | Admitting: Physician Assistant

## 2020-04-05 DIAGNOSIS — E8881 Metabolic syndrome: Secondary | ICD-10-CM

## 2020-04-05 DIAGNOSIS — L811 Chloasma: Secondary | ICD-10-CM

## 2020-04-05 MED ORDER — METFORMIN HCL ER 500 MG PO TB24: ORAL_TABLET | ORAL | 0 refills | Status: DC

## 2020-04-05 MED FILL — METFORMIN HCL ER 500 MG TB2: 500 | 30 days supply | Qty: 60 | Fill #0

## 2020-04-05 NOTE — Telephone Encounter (Signed)
Lab Results  Component Value Date   TSH 1.44 04/01/2020   Patient has had worsening fatigue since 2018 from notes. 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   She is on B12/Iron and it has been normal.  She had negative autoimmune work up with RNP, ANA, anti DNA, antismith, [place], celiac. She did have a + HLAB27 but saw Rheumotology that said she did not have AS or autoimmune.   She had a normal CXR 02/2020.    She has AB bloating still with food, but had negative colonoscopy with Dr. Ardis Hughs 03/2019.  She follows with Dr. Joycelyn Rua for GYN.  She had abnormal pelvic US in 2018 but followed with GYN. She has new melasma on her face. Hormones were normal other than slightly elevated insulin, will start on low dose metformin. Will check HCG to rule out HCG secreting tumor.   She had recent abnormal thyroid and was started on thyroid medication. She said she felt better for 1 week and then she did not.  She was then placed on brand name thyroid with help for 1 week and then not. She has an Ov with Dr. Chalmers Cater for evaluation of cytomel or other medications to help.   She has new complaint of being able to fall asleep in a chair at the drop of a hat. Suggested a sleep study but we will see Dr. Chalmers Cater, start metformin and then decide if she needs to do sleep study to rule out OSA, non REM sleep, narcolepsy.

## 2020-04-10 ENCOUNTER — Other Ambulatory Visit: Payer: 59

## 2020-04-10 ENCOUNTER — Other Ambulatory Visit: Payer: Self-pay

## 2020-04-10 DIAGNOSIS — L811 Chloasma: Secondary | ICD-10-CM | POA: Diagnosis not present

## 2020-04-10 LAB — HCG, QUANTITATIVE, PREGNANCY: HCG, Total, QN: <3 m[IU]/mL

## 2020-05-08 DIAGNOSIS — E8881 Metabolic syndrome: Secondary | ICD-10-CM | POA: Diagnosis not present

## 2020-05-08 DIAGNOSIS — N926 Irregular menstruation, unspecified: Secondary | ICD-10-CM | POA: Diagnosis not present

## 2020-05-08 DIAGNOSIS — E039 Hypothyroidism, unspecified: Secondary | ICD-10-CM | POA: Diagnosis not present

## 2020-05-09 ENCOUNTER — Other Ambulatory Visit (HOSPITAL_COMMUNITY): Payer: Self-pay | Admitting: Endocrinology

## 2020-05-09 MED FILL — NP THYROID 30 MG TABLET: 30 | 30 days supply | Qty: 30 | Fill #0

## 2020-06-03 ENCOUNTER — Telehealth: Payer: Self-pay | Admitting: Physician Assistant

## 2020-06-03 DIAGNOSIS — E8881 Metabolic syndrome: Secondary | ICD-10-CM

## 2020-06-03 NOTE — Telephone Encounter (Signed)
Patient informed but questions if she should still take the Metformin for her insulin levels?

## 2020-06-03 NOTE — Telephone Encounter (Signed)
DOES SHE NEED TO CONTINUE TAKING METFORMIN? IF SO NEED RENEWAL TO [facility]. PREFERS NOT TO CONTINUE, PLEASE ADVISE PATIENT

## 2020-06-03 NOTE — Telephone Encounter (Signed)
Left message on voice mail  to call back

## 2020-06-03 NOTE — Telephone Encounter (Signed)
If she feels the metformin is not helping, she can stop the medication. It was for the slightly elevated insulin and skin color issues she was having.  We will see her again in Sept  Recent Visits Date Type Provider Dept  04/01/20 Office Visit Vicie Mutters, [place]  02/21/20 Office Visit Vicie Mutters, [place]  03/15/19 Office Visit Vicie Mutters, PA-C [facility]  02/07/19 Office Visit Vicie Mutters, PA-C [place] recent visits within past 540 days with a meds authorizing provider and meeting all other requirements Future Appointments Date Type Provider Dept  06/24/20 Appointment Vicie Mutters, PA-C [facility]  Showing future appointments within next 150 days with a meds authorizing provider and meeting all other requirements

## 2020-06-04 MED ORDER — METFORMIN HCL ER 500 MG PO TB24: ORAL_TABLET | ORAL | 0 refills | Status: DC

## 2020-06-04 MED FILL — METFORMIN HCL ER 500 MG TB2: 500 | 30 days supply | Qty: 60 | Fill #0

## 2020-06-04 NOTE — Addendum Note (Signed)
Addended by: Chancy Hurter on: 06/04/2020 10:15 AM   Modules accepted: Orders

## 2020-06-04 NOTE — Telephone Encounter (Signed)
Left detailed message about Metformin being sent into the pharmacy.

## 2020-06-05 DIAGNOSIS — N390 Urinary tract infection, site not specified: Secondary | ICD-10-CM | POA: Diagnosis not present

## 2020-06-11 MED FILL — NP THYROID 30 MG TABLET: 30 | 30 days supply | Qty: 30 | Fill #1

## 2020-06-19 DIAGNOSIS — E039 Hypothyroidism, unspecified: Secondary | ICD-10-CM | POA: Diagnosis not present

## 2020-06-24 ENCOUNTER — Other Ambulatory Visit: Payer: Self-pay

## 2020-06-24 ENCOUNTER — Encounter: Payer: Self-pay | Admitting: Physician Assistant

## 2020-06-24 ENCOUNTER — Ambulatory Visit: Payer: 59 | Admitting: Physician Assistant

## 2020-06-24 VITALS — BP 120/70 | HR 65 | Temp 97.6°F | Wt 135.0 lb

## 2020-06-24 DIAGNOSIS — Z79899 Other long term (current) drug therapy: Secondary | ICD-10-CM | POA: Diagnosis not present

## 2020-06-24 DIAGNOSIS — R300 Dysuria: Secondary | ICD-10-CM

## 2020-06-24 DIAGNOSIS — E559 Vitamin D deficiency, unspecified: Secondary | ICD-10-CM | POA: Diagnosis not present

## 2020-06-24 DIAGNOSIS — I10 Essential (primary) hypertension: Secondary | ICD-10-CM

## 2020-06-24 DIAGNOSIS — N760 Acute vaginitis: Secondary | ICD-10-CM | POA: Diagnosis not present

## 2020-06-24 NOTE — Progress Notes (Signed)
Subjective:    Patient ID: Carmen Hoffman, female    DOB: [birth year], 49 y.o.   MRN: [id number]  HPI 49 y.o. WF presents for follow up for melasma. She was started on metformin for elevated insulin, normal A1C and states she has less bloating and is feeling better.  She is on a new thyroid medication with Dr. Chalmers Cater and has been feeling good.  Lab Results  Component Value Date   TSH 1.44 04/01/2020    Had UTI, treated with cipro due to positive culture at [facility]. She continues to feel the urge to urinate, has pressure, and has burning sensation. She has no vaginal discharge.  Had hematuria with first infection  Lab Results  Component Value Date   CHOL 178 02/21/2020   HDL 47 (L) 02/21/2020   LDLCALC 99 02/21/2020   TRIG 196 (H) 02/21/2020   CHOLHDL 3.8 02/21/2020   Lab Results  Component Value Date   VITAMINB12 >2,000 (H) 02/21/2020     Lab Results  Component Value Date   TSH 1.44 04/01/2020   Lab Results  Component Value Date   IRON 56 03/15/2019   TIBC 312 03/15/2019   FERRITIN 73 01/04/2017   Lab Results  Component Value Date   FERRITIN 73 01/04/2017    Blood pressure 120/70, pulse 65, temperature 97.6 F (36.4 C), weight 135 lb (61.2 kg), last menstrual period 06/12/2020, SpO2 99 %.  Medications  Current Outpatient Medications (Endocrine & Metabolic):  .  levothyroxine (SYNTHROID) 50 MCG tablet, Take 1 tablet daily on an empty stomach with only water for 30 minutes & no Antacid meds, Calcium or Magnesium for 4 hours & avoid Biotin .  metFORMIN (GLUCOPHAGE XR) 500 MG 24 hr tablet, Start taking 1 tablet PO Qdaily with largest meal.  If tolerating once a day dosing, then increase to 1 tablet PO BID with largest meals.      Current Outpatient Medications (Other):  .  ergocalciferol (VITAMIN D2) 1.25 MG (50000 UT) capsule, ergocalciferol (vitamin D2) 1,250 mcg (50,000 unit) capsule .  hydroquinone 4 % cream,  .  mupirocin ointment (BACTROBAN) 2 %, mupirocin  2 % topical ointment  APP BID .  tretinoin (RETIN-A) 0.025 % cream, SMARTSIG:1 Sparingly Topical Daily .  valACYclovir (VALTREX) 500 MG tablet, valacyclovir 500 mg tablet  TAKE 1 TABLET BY MOUTH ONCE DAILY FOR SUPPRESSION. TAKE TWICE DAILY FOR 3 DAYS FOR OUTBREAK  Problem list She has Iron deficiency anemia; Anxiety state; Depression, major, recurrent, in partial remission ([place]); GERD (gastroesophageal reflux disease); Genital herpes; B12 deficiency; Vitamin D deficiency; Occipital neuralgia; Neck pain on right side; Headache, tension-type; Tobacco abuse disorder; COPD (chronic obstructive pulmonary disease) ([place]); Atherosclerosis of aorta ([place]); Constipation; History of hemorrhoids; ANA positive; HLA B27 (HLA B27 positive); and Hypothyroidism on their problem list.    Review of Systems  See HPI    Objective:   Physical Exam Constitutional:      Appearance: She is well-developed.  HENT:     Head: Normocephalic and atraumatic.     Right Ear: External ear normal.     Left Ear: External ear normal.  Eyes:     Conjunctiva/sclera: Conjunctivae normal.     Pupils: Pupils are equal, round, and reactive to light.  Neck:     Thyroid: No thyromegaly.  Cardiovascular:     Rate and Rhythm: Normal rate and regular rhythm.     Heart sounds: Normal heart sounds. No murmur heard.  No friction rub. No gallop.  Pulmonary:     Effort: Pulmonary effort is normal. No respiratory distress.     Breath sounds: Normal breath sounds. No wheezing.  Abdominal:     General: Bowel sounds are normal. There is no distension.     Palpations: Abdomen is soft. There is no mass.     Tenderness: There is no abdominal tenderness. There is no guarding or rebound.  Musculoskeletal:        General: Normal range of motion.     Cervical back: Normal range of motion and neck supple.  Lymphadenopathy:     Cervical: No cervical adenopathy.  Skin:    General: Skin is warm and dry.     Comments: Malasma/brown macules  bilateral chin/face, nothing along axilla or inguinal area  Neurological:     Mental Status: She is alert and oriented to person, place, and time.     Cranial Nerves: No cranial nerve deficit.     Coordination: Coordination normal.     Deep Tendon Reflexes: Reflexes normal.        Assessment & Plan:     Vitamin D deficiency -     VITAMIN D 25 Hydroxy (Vit-D Deficiency, Fractures)  Hypertension, unspecified type -     Lipid panel  Medication management -     CBC with Differential/Platelet -     COMPLETE METABOLIC PANEL WITH GFR -     Magnesium  Dysuria -     Urinalysis, Routine w reflex microscopic -     Urine Culture -     C. trachomatis/N. gonorrhoeae RNA -     DG Abd 1 View; Future Urine culture negative with some calcium crystals will get KUB to look for stones  Acute vaginitis -     WET PREP BY MOLECULAR PROBE -     C. trachomatis/N. gonorrhoeae RNA -     MICROSCOPIC MESSAGE -     C. trachomatis/N. gonorrhoeae RNA

## 2020-06-26 LAB — URINALYSIS, ROUTINE W REFLEX MICROSCOPIC
Bilirubin Urine: NEGATIVE
Glucose, UA: NEGATIVE
Hgb urine dipstick: NEGATIVE
Ketones, ur: NEGATIVE
Nitrite: NEGATIVE
Protein, ur: NEGATIVE
RBC / HPF: NONE SEEN /HPF (ref 0–2)
Specific Gravity, Urine: 1.024 (ref 1.001–1.03)
pH: 6 (ref 5.0–8.0)

## 2020-06-26 LAB — CBC WITH DIFFERENTIAL/PLATELET
Absolute Monocytes: 421 cells/uL (ref 200–950)
Basophils Absolute: 32 cells/uL (ref 0–200)
Basophils Relative: 0.6 %
Eosinophils Absolute: 49 cells/uL (ref 15–500)
Eosinophils Relative: 0.9 %
HCT: 38.7 % (ref 35.0–45.0)
Hemoglobin: 12.7 g/dL (ref 11.7–15.5)
Lymphs Abs: 1598 cells/uL (ref 850–3900)
MCH: 29.5 pg (ref 27.0–33.0)
MCHC: 32.8 g/dL (ref 32.0–36.0)
MCV: 90 fL (ref 80.0–100.0)
MPV: 11.5 fL (ref 7.5–12.5)
Monocytes Relative: 7.8 %
Neutro Abs: 3299 cells/uL (ref 1500–7800)
Neutrophils Relative %: 61.1 %
Platelets: 181 10*3/uL (ref 140–400)
RBC: 4.3 10*6/uL (ref 3.80–5.10)
RDW: 12.1 % (ref 11.0–15.0)
Total Lymphocyte: 29.6 %
WBC: 5.4 10*3/uL (ref 3.8–10.8)

## 2020-06-26 LAB — URINE CULTURE
MICRO NUMBER:: 10944090
Result:: NO GROWTH
SPECIMEN QUALITY:: ADEQUATE

## 2020-06-26 LAB — LIPID PANEL
Cholesterol: 166 mg/dL (ref <200)
HDL: 53 mg/dL (ref >=50)
LDL Cholesterol (Calc): 98 mg/dL (calc)
Non-HDL Cholesterol (Calc): 113 mg/dL (calc) (ref <130)
Total CHOL/HDL Ratio: 3.1 (calc) (ref <5.0)
Triglycerides: 63 mg/dL (ref <150)

## 2020-06-26 LAB — C. TRACHOMATIS/N. GONORRHOEAE RNA
C. trachomatis RNA, TMA: NOT DETECTED
N. gonorrhoeae RNA, TMA: NOT DETECTED

## 2020-06-26 LAB — COMPLETE METABOLIC PANEL WITH GFR
AG Ratio: 1.9 (calc) (ref 1.0–2.5)
ALT: 9 U/L (ref 6–29)
AST: 13 U/L (ref 10–35)
Albumin: 4.4 g/dL (ref 3.6–5.1)
Alkaline phosphatase (APISO): 45 U/L (ref 31–125)
BUN: 10 mg/dL (ref 7–25)
CO2: 30 mmol/L (ref 20–32)
Calcium: 9.1 mg/dL (ref 8.6–10.2)
Chloride: 106 mmol/L (ref 98–110)
Creat: 0.6 mg/dL (ref 0.50–1.10)
GFR, Est African American: 125 mL/min/{1.73_m2} (ref >=60)
GFR, Est Non African American: 108 mL/min/{1.73_m2} (ref >=60)
Globulin: 2.3 g/dL (calc) (ref 1.9–3.7)
Glucose, Bld: 100 mg/dL — ABNORMAL HIGH (ref 65–99)
Potassium: 4.6 mmol/L (ref 3.5–5.3)
Sodium: 140 mmol/L (ref 135–146)
Total Bilirubin: 0.4 mg/dL (ref 0.2–1.2)
Total Protein: 6.7 g/dL (ref 6.1–8.1)

## 2020-06-26 LAB — WET PREP BY MOLECULAR PROBE
Candida species: NOT DETECTED
Gardnerella vaginalis: NOT DETECTED
MICRO NUMBER:: 10945807
SPECIMEN QUALITY:: ADEQUATE
Trichomonas vaginosis: NOT DETECTED

## 2020-06-26 LAB — VITAMIN D 25 HYDROXY (VIT D DEFICIENCY, FRACTURES): Vit D, 25-Hydroxy: 31 ng/mL (ref 30–100)

## 2020-06-26 LAB — MAGNESIUM: Magnesium: 2.1 mg/dL (ref 1.5–2.5)

## 2020-07-11 MED FILL — NP THYROID 30 MG TABLET: 30 | 30 days supply | Qty: 30 | Fill #2

## 2020-07-22 DIAGNOSIS — L811 Chloasma: Secondary | ICD-10-CM | POA: Diagnosis not present

## 2020-07-24 DIAGNOSIS — J383 Other diseases of vocal cords: Secondary | ICD-10-CM | POA: Diagnosis not present

## 2020-07-24 DIAGNOSIS — H73892 Other specified disorders of tympanic membrane, left ear: Secondary | ICD-10-CM | POA: Diagnosis not present

## 2020-07-24 DIAGNOSIS — H7401 Tympanosclerosis, right ear: Secondary | ICD-10-CM | POA: Diagnosis not present

## 2020-07-24 DIAGNOSIS — F17210 Nicotine dependence, cigarettes, uncomplicated: Secondary | ICD-10-CM | POA: Diagnosis not present

## 2020-07-24 DIAGNOSIS — J343 Hypertrophy of nasal turbinates: Secondary | ICD-10-CM | POA: Diagnosis not present

## 2020-07-24 DIAGNOSIS — K219 Gastro-esophageal reflux disease without esophagitis: Secondary | ICD-10-CM | POA: Diagnosis not present

## 2020-08-06 ENCOUNTER — Other Ambulatory Visit: Payer: Self-pay

## 2020-08-06 DIAGNOSIS — E8881 Metabolic syndrome: Secondary | ICD-10-CM

## 2020-08-06 MED ORDER — METFORMIN HCL ER 500 MG PO TB24: ORAL_TABLET | ORAL | 0 refills | Status: DC

## 2020-08-06 MED FILL — METFORMIN HCL ER 500 MG TB2: 500 | 30 days supply | Qty: 60 | Fill #0

## 2020-08-08 MED FILL — NP THYROID 30 MG TABLET: 30 | 30 days supply | Qty: 30 | Fill #3

## 2020-08-13 DIAGNOSIS — H9012 Conductive hearing loss, unilateral, left ear, with unrestricted hearing on the contralateral side: Secondary | ICD-10-CM | POA: Diagnosis not present

## 2020-09-09 MED FILL — NP THYROID 30 MG TABLET: 30 | 30 days supply | Qty: 30 | Fill #4

## 2020-09-25 ENCOUNTER — Telehealth: Payer: Self-pay

## 2020-09-25 NOTE — Telephone Encounter (Signed)
Refill request for Tramadol, 50mg  tabs, Take 2 tablets by mouth every 6 hours as needed for up to 5 days for moderate or severe pain. Qty:40

## 2020-10-01 ENCOUNTER — Other Ambulatory Visit: Payer: Self-pay | Admitting: Adult Health Nurse Practitioner

## 2020-10-01 ENCOUNTER — Other Ambulatory Visit: Payer: Self-pay | Admitting: Adult Health

## 2020-10-01 DIAGNOSIS — E8881 Metabolic syndrome: Secondary | ICD-10-CM

## 2020-10-01 MED FILL — METFORMIN HCL ER 500 MG TB2: 500 | 30 days supply | Qty: 60 | Fill #0

## 2020-10-07 MED FILL — NP THYROID 30 MG TABLET: 30 | 30 days supply | Qty: 30 | Fill #5

## 2020-11-06 MED FILL — NP THYROID 30 MG TABLET: 30 | 30 days supply | Qty: 30 | Fill #6

## 2020-11-07 DIAGNOSIS — N926 Irregular menstruation, unspecified: Secondary | ICD-10-CM | POA: Diagnosis not present

## 2020-11-07 DIAGNOSIS — E8881 Metabolic syndrome: Secondary | ICD-10-CM | POA: Diagnosis not present

## 2020-11-07 DIAGNOSIS — E039 Hypothyroidism, unspecified: Secondary | ICD-10-CM | POA: Diagnosis not present

## 2020-12-07 ENCOUNTER — Other Ambulatory Visit (HOSPITAL_COMMUNITY): Payer: Self-pay | Admitting: Endocrinology

## 2020-12-07 MED FILL — NP THYROID 30 MG TABLET: 30 | 30 days supply | Qty: 30 | Fill #0

## 2020-12-13 MED FILL — METFORMIN HCL ER 500 MG TB2: 500 | 30 days supply | Qty: 60 | Fill #1

## 2020-12-24 ENCOUNTER — Encounter: Payer: Self-pay | Admitting: Adult Health Nurse Practitioner

## 2020-12-24 ENCOUNTER — Other Ambulatory Visit: Payer: Self-pay | Admitting: Adult Health Nurse Practitioner

## 2020-12-24 ENCOUNTER — Ambulatory Visit: Payer: 59 | Admitting: Adult Health Nurse Practitioner

## 2020-12-24 ENCOUNTER — Other Ambulatory Visit: Payer: Self-pay

## 2020-12-24 VITALS — BP 128/72 | HR 65 | Temp 97.3°F | Wt 135.0 lb

## 2020-12-24 DIAGNOSIS — M542 Cervicalgia: Secondary | ICD-10-CM | POA: Diagnosis not present

## 2020-12-24 DIAGNOSIS — S161XXA Strain of muscle, fascia and tendon at neck level, initial encounter: Secondary | ICD-10-CM

## 2020-12-24 MED ORDER — TRAMADOL HCL 50 MG PO TABS: 100.0000 mg | ORAL_TABLET | Freq: Four times a day (QID) | ORAL | 0 refills | Status: DC | PRN

## 2020-12-24 MED ORDER — DEXAMETHASONE 4 MG PO TABS: ORAL_TABLET | ORAL | 1 refills | Status: DC

## 2020-12-24 NOTE — Progress Notes (Signed)
Assessment and Plan:  Carmen Hoffman was seen today for neck pain.  Diagnoses and all orders for this visit:  Strain of neck muscle, initial encounter Trigger point injection, tolerate well Discussed care, apply ice to the area 2-3 times 32min at a time this evening. -     dexamethasone (DECADRON) 4 MG tablet; Take 1 tab 3 x day - 3 days, then 2 x day - 3 days, then 1 tab daily Discussed medication and take with food start tomorrow morning.  Neck pain on right side -     traMADol (ULTRAM) 50 MG tablet; Take 2 tablets (100 mg total) by mouth every 6 (six) hours as needed for up to 5 days for moderate pain or severe pain.  Uses this PRN only.    Further disposition pending results of labs. Discussed med's effects and SE's.   Over 30 minutes of face to face interview, exam, counseling, chart review, and critical decision making was performed.   Future Appointments  Date Time Provider [place]  02/20/2021  2:00 PM Carmen Comber, NP [facility] None    ------------------------------------------------------------------------------------------------------------------   HPI 50 y.o.female presents for evaluation of neck pain, right side.  She reports this radiates to the base of her skull causing headaches that started 4 weeks ago.  It is dull throbbing pain that is constant.  At times she has a catch that is severe sharp pain that is 8/10 at [place].  Denies any trauma.  She reports she had the sam symptoms in 2016.  She received and injection for this and it was significantly improved.  She has taken tramadol, that was helpful.  She was taking Goodies powder, three doses every day for two weeks.  She has also tried 800mg  of ibuprofen.  She stopped all of this and has not taken anything for one week.  She has also tried heat and ice.   Past Medical History:  Diagnosis Date  . ANEMIA-IRON DEFICIENCY 05/30/2007  . Anxiety   . Aphthous stomatitis   . Arthritis   . Depression   . Genital  herpes   . GERD (gastroesophageal reflux disease)   . Migraines   . Throat pain 07/26/2008   2015 s/p ENT eval    . Von Willebrand disease ([place]) 1992     Allergies  Allergen Reactions  . Diflucan [Fluconazole]     Rash Lip swelling    Current Outpatient Medications on File Prior to Visit  Medication Sig  . ergocalciferol (VITAMIN D2) 1.25 MG (50000 UT) capsule ergocalciferol (vitamin D2) 1,250 mcg (50,000 unit) capsule  . levothyroxine (SYNTHROID) 50 MCG tablet Take 1 tablet daily on an empty stomach with only water for 30 minutes & no Antacid meds, Calcium or Magnesium for 4 hours & avoid Biotin  . metFORMIN (GLUCOPHAGE-XR) 500 MG 24 hr tablet TAKE 1 TAB BY MOUTH DAILY WITH LARGEST MEAL,IF TOLERATING 1 DAILY INCREASE TO 1 TAB 2 TIMES DAILY WITH LARGEST MEALS  . valACYclovir (VALTREX) 500 MG tablet valacyclovir 500 mg tablet  TAKE 1 TABLET BY MOUTH ONCE DAILY FOR SUPPRESSION. TAKE TWICE DAILY FOR 3 DAYS FOR OUTBREAK   No current facility-administered medications on file prior to visit.    ROS: all negative except above.   Physical Exam:  BP 128/72   Pulse 65   Temp (!) 97.3 F (36.3 C)   Wt 135 lb (61.2 kg)   SpO2 98%   BMI 23.17 kg/m   General Appearance: Well nourished, in no apparent distress. arynx.  Tonsils not swollen or erythematous. Hearing normal.  Neck: Supple, thyroid normal.  Respiratory: Respiratory effort normal, BS equal bilaterally without rales, rhonchi, wheezing or stridor.  Cardio: RRR with no MRGs. Brisk peripheral pulses without edema.  Lymphatics: Non tender without lymphadenopathy.  Musculoskeletal: Full ROM, 5/5 strength, normal gait. Right trapezius, tender, asymmetrical. Skin: Warm, dry without rashes, lesions, ecchymosis.  Neuro: Cranial nerves intact. Normal muscle tone, no cerebellar symptoms. Sensation intact.  Psych: Awake and oriented X 3, normal affect, Insight and Judgment appropriate.    Verbal consent obtained, risk and benefits  were addressed with patient. A trigger point injection was performed at the site of maximal tenderness,trap/ levator scapulae right,  using 1% plain Lidocaine and Dexamethasone. This was well tolerated, and followed by immediate relief of pain. Return precautions discussed with the patient. After care discussed.       Garnet Sierras, Laqueta Jean, DNP [facility] 12/24/2020  3:47 PM

## 2021-01-06 MED FILL — NP THYROID 30 MG TABLET: 30 | 30 days supply | Qty: 30 | Fill #1

## 2021-02-03 DIAGNOSIS — L720 Epidermal cyst: Secondary | ICD-10-CM | POA: Diagnosis not present

## 2021-02-04 ENCOUNTER — Other Ambulatory Visit (HOSPITAL_COMMUNITY): Payer: Self-pay

## 2021-02-04 MED FILL — Thyroid Tab 30 MG (1/2 Grain): ORAL | 30 days supply | Qty: 30 | Fill #0 | Status: AC

## 2021-02-20 ENCOUNTER — Other Ambulatory Visit: Payer: Self-pay

## 2021-02-20 ENCOUNTER — Ambulatory Visit (INDEPENDENT_AMBULATORY_CARE_PROVIDER_SITE_OTHER): Payer: 59 | Admitting: Adult Health

## 2021-02-20 ENCOUNTER — Encounter: Payer: Self-pay | Admitting: Adult Health

## 2021-02-20 VITALS — BP 100/60 | HR 68 | Temp 97.5°F | Ht 63.5 in | Wt 138.6 lb

## 2021-02-20 DIAGNOSIS — D649 Anemia, unspecified: Secondary | ICD-10-CM | POA: Diagnosis not present

## 2021-02-20 DIAGNOSIS — Z23 Encounter for immunization: Secondary | ICD-10-CM | POA: Diagnosis not present

## 2021-02-20 DIAGNOSIS — G44209 Tension-type headache, unspecified, not intractable: Secondary | ICD-10-CM

## 2021-02-20 DIAGNOSIS — Z720 Tobacco use: Secondary | ICD-10-CM

## 2021-02-20 DIAGNOSIS — Z1389 Encounter for screening for other disorder: Secondary | ICD-10-CM

## 2021-02-20 DIAGNOSIS — E039 Hypothyroidism, unspecified: Secondary | ICD-10-CM

## 2021-02-20 DIAGNOSIS — F3341 Major depressive disorder, recurrent, in partial remission: Secondary | ICD-10-CM

## 2021-02-20 DIAGNOSIS — K5900 Constipation, unspecified: Secondary | ICD-10-CM

## 2021-02-20 DIAGNOSIS — F419 Anxiety disorder, unspecified: Secondary | ICD-10-CM

## 2021-02-20 DIAGNOSIS — A6000 Herpesviral infection of urogenital system, unspecified: Secondary | ICD-10-CM

## 2021-02-20 DIAGNOSIS — I700 Atherosclerosis of aorta: Secondary | ICD-10-CM

## 2021-02-20 DIAGNOSIS — G8929 Other chronic pain: Secondary | ICD-10-CM

## 2021-02-20 DIAGNOSIS — M4302 Spondylolysis, cervical region: Secondary | ICD-10-CM

## 2021-02-20 DIAGNOSIS — E538 Deficiency of other specified B group vitamins: Secondary | ICD-10-CM

## 2021-02-20 DIAGNOSIS — K2100 Gastro-esophageal reflux disease with esophagitis, without bleeding: Secondary | ICD-10-CM

## 2021-02-20 DIAGNOSIS — J449 Chronic obstructive pulmonary disease, unspecified: Secondary | ICD-10-CM

## 2021-02-20 DIAGNOSIS — E559 Vitamin D deficiency, unspecified: Secondary | ICD-10-CM | POA: Diagnosis not present

## 2021-02-20 DIAGNOSIS — Z0000 Encounter for general adult medical examination without abnormal findings: Secondary | ICD-10-CM

## 2021-02-20 NOTE — Progress Notes (Signed)
Complete Physical  Assessment and Plan:  Encounter for general adult medical examination with abnormal findings 1 year  Tobacco abuse disorder Smoking cessation-  instruction/counseling given, counseled patient on the dangers of tobacco use, advised patient to stop smoking, and reviewed strategies to maximize success, patient not ready to quit at this time.  -     EKG 12-Lead - declined this year -     DG Chest 2 View; Future - declined this year  Chronic obstructive pulmonary disease, unspecified COPD type ([place]) No triggers, well controlled symptoms, cont to monitor STOP SMOKING, check CXR routinely, start lung cancer screening age 50  Atherosclerosis of aorta ([place]) Control blood pressure, cholesterol, glucose, increase exercise.  STOP SMOKING -     Lipid panel  Hypothyroidism, unspecified type Dr. Chalmers Cater follows, has upcoming appointment, defer TSH Continue thyroid medication as recommended  Depression, major, recurrent, in partial remission (HCC)/ Anxiety  She attributes to pain state, reports mood is fair. Declines interventions.  Stress management techniques discussed, increase water, good sleep hygiene discussed, increase exercise, and increase veggies.  -     TSH  B12 deficiency -      continue B12 supplement, check level PRN  Vitamin D deficiency -     VITAMIN D 25 Hydroxy (Vit-D Deficiency, Fractures)  Constipation, unspecified constipation type Following up with Dr Ardis Hughs  History of hemorrhoids Follow up Dr. Ardis Hughs  Gastroesophageal reflux disease with esophagitis Continue PPI/H2 blocker, diet discussed  Genital herpes simplex, unspecified site Monitor, antiviral PRN  Medication management -     CBC with Differential/Platelet -     COMPLETE METABOLIC PANEL WITH GFR -     Magnesium  Screening cholesterol level -     Lipid panel  Screening for hematuria or proteinuria -     Urinalysis, Routine w reflex microscopic  Cervical spondylosis/chronic neck  pain With consequent tension/cervical HA After discussion will refer to ortho for further interventions, possible steroid injections, PT as recommended.  Declines steroid taper today; continue OTC analgesics, has tramadol PRN if severe  Discussed med's effects and SE's. Screening labs and tests as requested with regular follow-up as recommended. Over 40 minutes of exam, counseling, chart review and critical decision making was performed  Future Appointments  Date Time Provider [place]  02/23/2022  2:00 PM Liane Comber, NP [facility] None     HPI  This very nice 50 y.o.female presents for complete physical. She has Depression, major, recurrent, in partial remission ([place]); GERD (gastroesophageal reflux disease); Genital herpes; B12 deficiency; Vitamin D deficiency; Neck pain on right side; Headache, tension-type; Tobacco abuse disorder; COPD (chronic obstructive pulmonary disease) ([place]); Atherosclerosis of aorta ([place]); Constipation; ANA positive; HLA B27 (HLA B27 positive); Hypothyroidism; and Cervical spondylolysis on their problem list.  She works as Charity fundraiser, [employer], married, 2 grown kids, expecting first grandchild soon.   Follows with GYN - follows with Dr. Paulene Floor office annually.   She has had recurrent neck pain, most recently flaring since Feb 2022, improved with steroid IM but only helped for 1 week, has tried tylenol, ibuprofen, goody powders, has been prescribed tramadol that takes sparingly when severe pain. She had steroid taper recently that did resolve pain temporarily. She has xray from 04/2019 showing multilevel spondylosis and facet joint arthropathy. She saw Dr. Estil Daft 05/16/2019 for + ANA and HLB27 but felt no signs of autoimmune OA at this time.   She has consequent tension HA that is limiting and affecting her mood. Improves but doesn't resolve  in AM, worse after work. Denies radicular sx.   She has started back to smoking. stopped 2017,  started back 2021, 32 years, 0.5 pack on average. Not interested in quitting at this time.   BMI is Body mass index is 24.17 kg/m., she has not been working on diet and exercise, tries to eat healthy, veggies, protein.  2 bottles of water per day, 3 small cans of regular soda - sprite Wt Readings from Last 3 Encounters:  02/20/21 138 lb 9.6 oz (62.9 kg)  12/24/20 135 lb (61.2 kg)  06/24/20 135 lb (61.2 kg)   Her blood pressure has been controlled at home, today their BP is BP: 100/60  She does not workout, she was walking her dogs. She denies chest pain, shortness of breath, dizziness.  She has aortic atherosclerosis per CXR 2017   She is not on cholesterol medication and denies myalgias. Her cholesterol is at goal. The cholesterol last visit was:   Lab Results  Component Value Date   CHOL 166 06/24/2020   HDL 53 06/24/2020   LDLCALC 98 06/24/2020   TRIG 63 06/24/2020   CHOLHDL 3.1 06/24/2020   Last A1C in the office was:  Lab Results  Component Value Date   HGBA1C 4.9 02/21/2020   Patient is on Vitamin D supplement, she is on 5000 IU of vitamin D daily Lab Results  Component Value Date   VD25OH 31 06/24/2020     Was on B12 injections in the past, she is on B12 sublingual, taking 5000 mcg daily Lab Results  Component Value Date   VITAMINB12 >2,000 (H) 02/21/2020   She is on thyroid medication. Her medication was not changed last visit, taking 30 mg armour thryoid, Dr. Chalmers Cater follows, has upcoming appointment  Lab Results  Component Value Date   TSH 1.44 04/01/2020      Current Medications:  Current Outpatient Medications on File Prior to Visit  Medication Sig Dispense Refill  . CHOLECALCIFEROL PO Take 5,000 Units by mouth daily.    . Cyanocobalamin (B-12) 5000 MCG SUBL Place 1 tablet under the tongue once a week.    . thyroid (ARMOUR) 30 MG tablet TAKE 1 TABLET BY MOUTH ON AN EMPTY STOMACH ONCE A DAY 30 tablet 6  . traMADol (ULTRAM) 50 MG tablet TAKE 2 TABLETS (100  MG TOTAL) BY MOUTH EVERY 6 (SIX) HOURS AS NEEDED FOR UP TO 5 DAYS FOR MODERATE PAIN OR SEVERE PAIN. 30 tablet 0  . valACYclovir (VALTREX) 500 MG tablet as needed.     No current facility-administered medications on file prior to visit.   Health Maintenance:   Immunization History  Administered Date(s) Administered  . Influenza Split 07/18/2012  . Influenza-Unspecified 07/11/2013  . Tdap 02/20/2021   TD/TDAP: Tdap today  Influenza: gets at work, last 2021 Pneumovax:  Defer,  COVID 19: 2/2, pfizer - send information   LMP: No LMP recorded. Pap:  Has GYN, Joycelyn Rua, last 2021? Sees annually  MGM: 2021 by GYN  Colonoscopy 2020 Dr. Ardis Hughs, plyps, 7 year recall  EGD 08/2014  Last eye: Glasses, goes annually, Dr. ? Last dental: goes q29m, 2022 Last derm: 2022, Dr. ?, hx of ?SCC from face  Allergies:  Allergies  Allergen Reactions  . Diflucan [Fluconazole]     Rash Lip swelling   Medical History:  has Depression, major, recurrent, in partial remission ([place]); GERD (gastroesophageal reflux disease); Genital herpes; B12 deficiency; Vitamin D deficiency; Neck pain on right side; Headache, tension-type; Tobacco abuse  disorder; COPD (chronic obstructive pulmonary disease) ([place]); Atherosclerosis of aorta ([place]); Constipation; ANA positive; HLA B27 (HLA B27 positive); Hypothyroidism; and Cervical spondylolysis on their problem list. Surgical History:  She  has a past surgical history that includes Nose surgery (1991); Tubal ligation (Bilateral, 1988); Colonoscopy; and Esophagogastroduodenoscopy. Family History:  Her family history includes Arthritis in her brother; Bladder Cancer in her father; Breast cancer in her cousin; COPD in her father; Cancer in her paternal uncle; Colon cancer in her paternal aunt; Fibromyalgia in her mother; Healthy in her daughter and son; Heart disease in her maternal grandfather; Hypertension in her father and mother; Pancreatic cancer (age of onset: 41) in her  mother; Thyroid disease in her brother. Social History:   reports that she has been smoking cigarettes. She started smoking about 37 years ago. She has a 16.50 pack-year smoking history. She has never used smokeless tobacco. She reports that she does not drink alcohol and does not use drugs.  Review of Systems: Review of Systems  Constitutional: Negative for chills, fever and weight loss.  HENT: Negative for congestion, ear discharge, ear pain, hearing loss, nosebleeds, sore throat and tinnitus.   Eyes: Negative.  Negative for blurred vision and double vision.  Respiratory: Negative.  Negative for cough, shortness of breath, wheezing and stridor.   Cardiovascular: Negative.  Negative for chest pain, palpitations, orthopnea, claudication and leg swelling.  Gastrointestinal: Negative.  Negative for abdominal pain, blood in stool, constipation, diarrhea, heartburn, melena, nausea and vomiting.  Genitourinary: Negative.   Musculoskeletal: Positive for neck pain (R, persistent 3 months, recurrent). Negative for back pain, joint pain and myalgias.  Skin: Negative.  Negative for rash.  Neurological: Positive for headaches (with neck pain). Negative for dizziness, tingling, sensory change, focal weakness and weakness.  Endo/Heme/Allergies: Negative.  Negative for polydipsia.  Psychiatric/Behavioral: Negative for depression, substance abuse and suicidal ideas. The patient has insomnia (secondary to pain). The patient is not nervous/anxious.   All other systems reviewed and are negative.   Physical Exam: Estimated body mass index is 24.17 kg/m as calculated from the following:   Height as of this encounter: 5' 3.5" (1.613 m).   Weight as of this encounter: 138 lb 9.6 oz (62.9 kg). BP 100/60   Pulse 68   Temp (!) 97.5 F (36.4 C)   Ht 5' 3.5" (1.613 m)   Wt 138 lb 9.6 oz (62.9 kg)   SpO2 97%   BMI 24.17 kg/m  General Appearance: Well nourished, in no apparent distress.  Eyes: PERRLA, EOMs,  conjunctiva no swelling or erythema, normal fundi and vessels.  Sinuses: No Frontal/maxillary tenderness  ENT/Mouth: Ext aud canals clear, normal light reflex with TMs without erythema, bulging. Good dentition without abscesses. No erythema, swelling, or exudate on post pharynx. Tonsils not swollen or erythematous. Hearing normal.  Neck: Supple, thyroid normal. No bruits  Respiratory: Respiratory effort normal, BS equal bilaterally without rales, rhonchi, wheezing or stridor.  Cardio: RRR without murmurs, rubs or gallops. Brisk peripheral pulses without edema.  Breasts: defer to GYN Abdomen: Soft, nontender, no guarding, rebound, hernias, masses, or organomegaly.  Lymphatics: Non tender without lymphadenopathy.  Genitourinary: defer to GYN Musculoskeletal: Full ROM all peripheral extremities, 5/5 strength, and normal gait. Very tender right SCM/traps, no spinous tenderness, no radicular symptoms.  Skin: Warm, dry without rashes, lesions, ecchymosis. Neuro: Cranial nerves intact, reflexes equal bilaterally. Normal muscle tone, no cerebellar symptoms. Sensation intact.  Psych: Awake and oriented X 3, avoidant, normal affect, Insight and Judgment appropriate.  EKG: WNL in 2021, declines today, will get next year  Izora Ribas 6:12 PM [facility]

## 2021-02-20 NOTE — Patient Instructions (Addendum)
Carmen Hoffman , Thank you for taking time to come for your Annual Wellness Visit. I appreciate your ongoing commitment to your health goals. Please review the following plan we discussed and let me know if I can assist you in the future.   These are the goals we discussed: Goals    . DIET - EAT MORE FRUITS AND VEGETABLES     Aim for 5+ 1/2 cup servings daily     . DIET - INCREASE WATER INTAKE     Increase to 3+ bottles daily, less soda    . Quit Smoking       This is a list of the screening recommended for you and due dates:  Health Maintenance  Topic Date Due  . COVID-19 Vaccine (1) Never done  . HIV Screening  Never done  . Hepatitis C Screening: USPSTF Recommendation to screen - Ages 68-79 yo.  Never done  . Tetanus Vaccine  Never done  . Pap Smear  01/22/2020  . Flu Shot  05/12/2021  . Colon Cancer Screening  03/23/2026  . HPV Vaccine  Aged Out    Please send information about covid 19 vaccine through mychart  Tdap boosted today -   Please clarify with GYN last PAP and Mammogram - please have them forward reports   Appears your headache is due to neck problems - known arthritis, not improving with regular meds, ortho should be able to do injections and refer for physical therapy exercises to fix at the source.     Tension Headache, Adult A tension headache is a feeling of pain, pressure, or aching over the front and sides of the head. The pain can be dull, or it can feel tight. There are two types of tension headache:  Episodic tension headache. This is when the headaches happen fewer than 15 days a month.  Chronic tension headache. This is when the headaches happen more than 15 days a month during a 60-month period. A tension headache can last from 30 minutes to several days. It is the most common kind of headache. Tension headaches are not normally associated with nausea or vomiting, and they do not get worse with physical activity. What are the causes? The exact  cause of this condition is not known. Tension headaches are often triggered by stress, anxiety, or depression. Other triggers may include:  Alcohol.  Too much caffeine or caffeine withdrawal.  Respiratory infections, such as colds, flu, or sinus infections.  Dental problems or teeth clenching.  Fatigue.  Holding your head and neck in the same position for a long period of time, such as while using a computer.  Smoking.  Arthritis of the neck. What are the signs or symptoms? Symptoms of this condition include:  A feeling of pressure or tightness around the head.  Dull, aching head pain.  Pain over the front and sides of the head.  Tenderness in the muscles of the head, neck, and shoulders. How is this diagnosed? This condition may be diagnosed based on your symptoms, your medical history, and a physical exam. If your symptoms are severe or unusual, you may have imaging tests, such as a CT scan or an MRI of your head. Your vision may also be checked. How is this treated? This condition may be treated with lifestyle changes and with medicines that help relieve symptoms. Follow these instructions at home: Managing pain  Take over-the-counter and prescription medicines only as told by your health care provider.  When you have  a headache, lie down in a dark, quiet room.  If directed, put ice on your head and neck. To do this: ? Put ice in a plastic bag. ? Place a towel between your skin and the bag. ? Leave the ice on for 20 minutes, 2-3 times a day. ? Remove the ice if your skin turns bright red. This is very important. If you cannot feel pain, heat, or cold, you have a greater risk of damage to the area.  If directed, apply heat to the back of your neck as often as told by your health care provider. Use the heat source that your health care provider recommends, such as a moist heat pack or a heating pad. ? Place a towel between your skin and the heat source. ? Leave the  heat on for 20-30 minutes. ? Remove the heat if your skin turns bright red. This is especially important if you are unable to feel pain, heat, or cold. You have a greater risk of getting burned. Eating and drinking  Eat meals on a regular schedule.  If you drink alcohol: ? Limit how much you have to:  0-1 drink a day for women who are not pregnant.  0-2 drinks a day for men. ? Know how much alcohol is in your drink. In the [place] one drink equals one 12 oz bottle of beer (355 mL), one 5 oz glass of wine (148 mL), or one 1 oz glass of hard liquor (44 mL).  Drink enough fluid to keep your urine pale yellow.  Decrease your caffeine intake, or stop using caffeine. Lifestyle  Get 7-9 hours of sleep each night, or get the amount of sleep recommended by your health care provider.  At bedtime, remove computers, phones, and tablets from your room.  Find ways to manage your stress. This may include: ? Exercise. ? Deep breathing exercises. ? Yoga. ? Listening to music. ? Positive mental imagery.  Try to sit up straight and avoid tensing your muscles.  Do not use any products that contain nicotine or tobacco. These include cigarettes, chewing tobacco, and vaping devices, such as e-cigarettes. If you need help quitting, ask your health care provider. General instructions  Avoid any headache triggers. Keep a journal to help find out what may trigger your headaches. For example, write down: ? What you eat and drink. ? How much sleep you get. ? Any change to your diet or medicines.  Keep all follow-up visits. This is important.   Contact a health care provider if:  Your headache does not get better.  Your headache comes back.  You are sensitive to sounds, light, or smells because of a headache.  You have nausea or you vomit.  Your stomach hurts. Get help right away if:  You suddenly develop a severe headache, along with any of the following: ? A stiff neck. ? Nausea and  vomiting. ? Confusion. ? Weakness in one part or one side of your body. ? Double vision or loss of vision. ? Shortness of breath. ? Rash. ? Unusual sleepiness. ? Fever or chills. ? Trouble speaking. ? Pain in your eye or ear. ? Trouble walking or balancing. ? Feeling faint or passing out. Summary  A tension headache is a feeling of pain, pressure, or aching over the front and sides of the head.  A tension headache can last from 30 minutes to several days. It is the most common kind of headache.  This condition may be diagnosed based  on your symptoms, your medical history, and a physical exam.  This condition may be treated with lifestyle changes and with medicines that help relieve symptoms. This information is not intended to replace advice given to you by your health care provider. Make sure you discuss any questions you have with your health care provider. Document Revised: 06/27/2020 Document Reviewed: 06/27/2020 Elsevier Patient Education  2021 [employer].

## 2021-02-22 LAB — COMPLETE METABOLIC PANEL WITH GFR
AG Ratio: 2 (calc) (ref 1.0–2.5)
ALT: 9 U/L (ref 6–29)
AST: 13 U/L (ref 10–35)
Albumin: 4.5 g/dL (ref 3.6–5.1)
Alkaline phosphatase (APISO): 44 U/L (ref 31–125)
BUN: 19 mg/dL (ref 7–25)
CO2: 29 mmol/L (ref 20–32)
Calcium: 9.3 mg/dL (ref 8.6–10.2)
Chloride: 103 mmol/L (ref 98–110)
Creat: 0.5 mg/dL (ref 0.50–1.10)
GFR, Est African American: 132 mL/min/{1.73_m2} (ref >=60)
GFR, Est Non African American: 114 mL/min/{1.73_m2} (ref >=60)
Globulin: 2.2 g/dL (calc) (ref 1.9–3.7)
Glucose, Bld: 88 mg/dL (ref 65–99)
Potassium: 4 mmol/L (ref 3.5–5.3)
Sodium: 138 mmol/L (ref 135–146)
Total Bilirubin: 0.2 mg/dL (ref 0.2–1.2)
Total Protein: 6.7 g/dL (ref 6.1–8.1)

## 2021-02-22 LAB — CBC WITH DIFFERENTIAL/PLATELET
Absolute Monocytes: 390 cells/uL (ref 200–950)
Basophils Absolute: 62 cells/uL (ref 0–200)
Basophils Relative: 1.2 %
Eosinophils Absolute: 99 cells/uL (ref 15–500)
Eosinophils Relative: 1.9 %
HCT: 35.1 % (ref 35.0–45.0)
Hemoglobin: 11.6 g/dL — ABNORMAL LOW (ref 11.7–15.5)
Lymphs Abs: 1934 cells/uL (ref 850–3900)
MCH: 30.4 pg (ref 27.0–33.0)
MCHC: 33 g/dL (ref 32.0–36.0)
MCV: 92.1 fL (ref 80.0–100.0)
MPV: 11.3 fL (ref 7.5–12.5)
Monocytes Relative: 7.5 %
Neutro Abs: 2714 cells/uL (ref 1500–7800)
Neutrophils Relative %: 52.2 %
Platelets: 171 10*3/uL (ref 140–400)
RBC: 3.81 10*6/uL (ref 3.80–5.10)
RDW: 12.4 % (ref 11.0–15.0)
Total Lymphocyte: 37.2 %
WBC: 5.2 10*3/uL (ref 3.8–10.8)

## 2021-02-22 LAB — MAGNESIUM: Magnesium: 2 mg/dL (ref 1.5–2.5)

## 2021-02-22 LAB — LIPID PANEL
Cholesterol: 143 mg/dL (ref <200)
HDL: 45 mg/dL — ABNORMAL LOW (ref >=50)
LDL Cholesterol (Calc): 77 mg/dL (calc)
Non-HDL Cholesterol (Calc): 98 mg/dL (calc) (ref <130)
Total CHOL/HDL Ratio: 3.2 (calc) (ref <5.0)
Triglycerides: 124 mg/dL (ref <150)

## 2021-02-22 LAB — URINALYSIS, ROUTINE W REFLEX MICROSCOPIC
Bilirubin Urine: NEGATIVE
Glucose, UA: NEGATIVE
Hgb urine dipstick: NEGATIVE
Ketones, ur: NEGATIVE
Leukocytes,Ua: NEGATIVE
Nitrite: NEGATIVE
Protein, ur: NEGATIVE
Specific Gravity, Urine: 1.026 (ref 1.001–1.035)
pH: 5.5 (ref 5.0–8.0)

## 2021-02-22 LAB — TEST AUTHORIZATION

## 2021-02-22 LAB — VITAMIN D 25 HYDROXY (VIT D DEFICIENCY, FRACTURES): Vit D, 25-Hydroxy: 58 ng/mL (ref 30–100)

## 2021-02-22 LAB — IRON,TIBC AND FERRITIN PANEL
%SAT: 15 % (calc) — ABNORMAL LOW (ref 16–45)
Ferritin: 102 ng/mL (ref 16–232)
Iron: 44 ug/dL (ref 40–190)
TIBC: 303 mcg/dL (calc) (ref 250–450)

## 2021-03-06 ENCOUNTER — Other Ambulatory Visit: Payer: Self-pay

## 2021-03-06 ENCOUNTER — Other Ambulatory Visit (HOSPITAL_COMMUNITY): Payer: Self-pay

## 2021-03-06 MED ORDER — NP THYROID 30 MG PO TABS
30.0000 mg | ORAL_TABLET | Freq: Every day | ORAL | 7 refills | Status: DC
  Filled 2021-03-06: qty 30, 30d supply, fill #0
  Filled 2021-04-07: qty 30, 30d supply, fill #1
  Filled 2021-05-05: qty 30, 30d supply, fill #2
  Filled 2021-06-05: qty 30, 30d supply, fill #3
  Filled 2021-07-10: qty 30, 30d supply, fill #4
  Filled 2021-08-12: qty 30, 30d supply, fill #5
  Filled 2021-09-15: qty 30, 30d supply, fill #6
  Filled 2021-10-17: qty 30, 30d supply, fill #7

## 2021-03-07 ENCOUNTER — Other Ambulatory Visit (HOSPITAL_COMMUNITY): Payer: Self-pay

## 2021-03-07 DIAGNOSIS — M542 Cervicalgia: Secondary | ICD-10-CM | POA: Diagnosis not present

## 2021-03-07 MED ORDER — METHOCARBAMOL 500 MG PO TABS
500.0000 mg | ORAL_TABLET | Freq: Four times a day (QID) | ORAL | 2 refills | Status: DC
  Filled 2021-03-07: qty 60, 8d supply, fill #0

## 2021-03-11 ENCOUNTER — Other Ambulatory Visit (HOSPITAL_COMMUNITY): Payer: Self-pay

## 2021-03-24 DIAGNOSIS — M47812 Spondylosis without myelopathy or radiculopathy, cervical region: Secondary | ICD-10-CM | POA: Diagnosis not present

## 2021-04-03 DIAGNOSIS — M47812 Spondylosis without myelopathy or radiculopathy, cervical region: Secondary | ICD-10-CM | POA: Diagnosis not present

## 2021-04-07 ENCOUNTER — Other Ambulatory Visit (HOSPITAL_COMMUNITY): Payer: Self-pay

## 2021-04-19 DIAGNOSIS — M542 Cervicalgia: Secondary | ICD-10-CM | POA: Diagnosis not present

## 2021-04-22 NOTE — Progress Notes (Signed)
Assessment and Plan:  Carmen Hoffman was seen today for neck pain.  Diagnoses and all orders for this visit:  Strain of neck muscle, initial encounter Trigger point injection, tolerate well Discussed care, apply ice to the area 2-3 times 63min at a time this evening. -     dexamethasone (DECADRON) 4 MG tablet; Take 1 tab 3 x day - 3 days, then 2 x day - 3 days, then 1 tab daily Discussed medication and take with food start tomorrow morning. Pt will continue to follow with orthopedics, had MRI last week and will be getting results  Neck pain on right side -     traMADol (ULTRAM) 50 MG tablet; Take 2 tablets (100 mg total) by mouth every 6 (six) hours as needed for up to 5 days for moderate pain or severe pain.  Uses this PRN only.    Further disposition pending results of labs. Discussed med's effects and SE's.   Over 30 minutes of face to face interview, exam, counseling, chart review, and critical decision making was performed.   Future Appointments  Date Time Provider [place]  02/23/2022  2:00 PM Liane Comber, NP [facility] None    ------------------------------------------------------------------------------------------------------------------   HPI 50 y.o.female presents for reevaluation of neck pain, right side.  She reports the pain begins in the right shoulder and radiates to the base of her skull causing headaches has been present for 5 months .  It is dull throbbing pain that is constant.  At times she has a catch that is severe sharp pain that is 8/10 at [place].  Denies any trauma.  She received a trigger point injection 12/24/20 for this and it was significantly improved for 1 week. Ortho gave injection into neck 3 weeks ago which improved headaches but not the neck pain.   She has been taking tramadol at night and does take Goody Powders once in the morning .  This provides only minimal relief and will still have spasm and can not move for a short time.      Past  Medical History:  Diagnosis Date   ANEMIA-IRON DEFICIENCY 05/30/2007   Anxiety    Aphthous stomatitis    Arthritis    Cellulitis and abscess of buttock 07/12/2013   10/14 L    Depression    Genital herpes    GERD (gastroesophageal reflux disease)    History of hemorrhoids 06/02/2018   Migraines    Occipital neuralgia 03/27/2013   6/14 R 8/15 Left    Throat pain 07/26/2008   2015 s/p ENT eval     Von Willebrand disease ([place]) 1992     Allergies  Allergen Reactions   Diflucan [Fluconazole]     Rash Lip swelling    Current Outpatient Medications on File Prior to Visit  Medication Sig   thyroid (NP THYROID) 30 MG tablet Take 1 tablet (30 mg total) by mouth daily on an empty stomach.   traMADol (ULTRAM) 50 MG tablet TAKE 2 TABLETS (100 MG TOTAL) BY MOUTH EVERY 6 (SIX) HOURS AS NEEDED FOR UP TO 5 DAYS FOR MODERATE PAIN OR SEVERE PAIN.   valACYclovir (VALTREX) 500 MG tablet as needed.   CHOLECALCIFEROL PO Take 5,000 Units by mouth daily. (Patient not taking: Reported on 04/23/2021)   Cyanocobalamin (B-12) 5000 MCG SUBL Place 1 tablet under the tongue once a week. (Patient not taking: Reported on 04/23/2021)   methocarbamol (ROBAXIN) 500 MG tablet Take 1-2 tablets (500-1,000 mg total) by mouth every 6 to 8  hours as needed for spasms and muscle tension. (Patient not taking: Reported on 04/23/2021)   thyroid (ARMOUR) 30 MG tablet TAKE 1 TABLET BY MOUTH ON AN EMPTY STOMACH ONCE A DAY (Patient not taking: Reported on 04/23/2021)   No current facility-administered medications on file prior to visit.    ROS: all negative except above.   Physical Exam:  BP 90/62   Pulse 69   Temp (!) 96.8 F (36 C)   Wt 133 lb 9.6 oz (60.6 kg)   SpO2 99%   BMI 23.29 kg/m   General Appearance: Well nourished, in no apparent distress. arynx.  Tonsils not swollen or erythematous. Hearing normal.  Neck: Supple, thyroid normal.  Respiratory: Respiratory effort normal, BS equal bilaterally without rales,  rhonchi, wheezing or stridor.  Cardio: RRR with no MRGs. Brisk peripheral pulses without edema.  Lymphatics: Non tender without lymphadenopathy.  Musculoskeletal: Full ROM, 5/5 strength, normal gait. Right trapezius, tender, asymmetrical. Skin: Warm, dry without rashes, lesions, ecchymosis.  Neuro: Cranial nerves intact. Normal muscle tone, no cerebellar symptoms. Sensation intact.  Psych: Awake and oriented X 3, normal affect, Insight and Judgment appropriate.    Verbal consent obtained, risk and benefits were addressed with patient. A trigger point injection was performed at the site of maximal tenderness,trap/ levator scapulae right,  using 1% plain Lidocaine and Dexamethasone. This was well tolerated, and followed by immediate relief of pain. Return precautions discussed with the patient. After care discussed.       Garnet Sierras, Laqueta Jean, DNP [facility] 04/23/2021  11:44 AM

## 2021-04-23 ENCOUNTER — Other Ambulatory Visit: Payer: Self-pay

## 2021-04-23 ENCOUNTER — Encounter: Payer: Self-pay | Admitting: Nurse Practitioner

## 2021-04-23 ENCOUNTER — Other Ambulatory Visit (HOSPITAL_COMMUNITY): Payer: Self-pay

## 2021-04-23 ENCOUNTER — Ambulatory Visit: Payer: 59 | Admitting: Nurse Practitioner

## 2021-04-23 VITALS — BP 90/62 | HR 69 | Temp 96.8°F | Wt 133.6 lb

## 2021-04-23 DIAGNOSIS — S161XXA Strain of muscle, fascia and tendon at neck level, initial encounter: Secondary | ICD-10-CM

## 2021-04-23 DIAGNOSIS — E039 Hypothyroidism, unspecified: Secondary | ICD-10-CM

## 2021-04-23 DIAGNOSIS — M542 Cervicalgia: Secondary | ICD-10-CM | POA: Diagnosis not present

## 2021-04-23 MED ORDER — TRAMADOL HCL 50 MG PO TABS
100.0000 mg | ORAL_TABLET | Freq: Four times a day (QID) | ORAL | 0 refills | Status: DC | PRN
  Filled 2021-04-23: qty 30, 5d supply, fill #0

## 2021-04-23 MED ORDER — DEXAMETHASONE 4 MG PO TABS
ORAL_TABLET | ORAL | 1 refills | Status: AC
  Filled 2021-04-23: qty 20, 11d supply, fill #0
  Filled 2021-07-31: qty 20, 11d supply, fill #1

## 2021-04-23 MED ORDER — LIDOCAINE HCL (PF) 1 % IJ SOLN
1.0000 mg | Freq: Once | INTRAMUSCULAR | Status: AC
  Administered 2021-04-23: 1 mg via INTRADERMAL

## 2021-04-23 NOTE — Patient Instructions (Signed)
Trigger Point Injection Trigger points are areas where you have pain. A trigger point injection is a shot given in the trigger point to help relieve pain for a few days to a few months. Common places for trigger points include: The neck. The shoulders. The upper back. The lower back. A trigger point injection will not cure long-term (chronic) pain permanently. These injections do not always work for every person. Forsome people, they can help to relieve pain for a few days to a few months. Tell a health care provider about: Any allergies you have. All medicines you are taking, including vitamins, herbs, eye drops, creams, and over-the-counter medicines. Any problems you or family members have had with anesthetic medicines. Any blood disorders you have. Any surgeries you have had. Any medical conditions you have. What are the risks? Generally, this is a safe procedure. However, problems may occur, including: Infection. Bleeding or bruising. Allergic reaction to the injected medicine. Irritation of the skin around the injection site. What happens before the procedure? Ask your health care provider about: Changing or stopping your regular medicines. This is especially important if you are taking diabetes medicines or blood thinners. Taking medicines such as aspirin and ibuprofen. These medicines can thin your blood. Do not take these medicines unless your health care provider tells you to take them. Taking over-the-counter medicines, vitamins, herbs, and supplements. What happens during the procedure?  Your health care provider will feel for trigger points. A marker may be used to circle the area for the injection. The skin over the trigger point will be washed with a germ-killing (antiseptic) solution. A thin needle is used for the injection. You may feel pain or a twitching feeling when the needle enters the trigger point. A numbing solution may be injected into the trigger point.  Sometimes a medicine to keep down inflammation is also injected. Your health care provider may move the needle around the area where the trigger point is located until the tightness and twitching goes away. After the injection, your health care provider may put gentle pressure over the injection site. The injection site will be covered with a bandage (dressing). The procedure may vary among health care providers and hospitals. What can I expect after treatment? After treatment, you may have: Soreness and stiffness for 1-2 days. A dressing. This can be taken off in a few hours or as told by your health care provider. Follow these instructions at home: Injection site care Remove your dressing as told by your health care provider. Check your injection site every day for signs of infection. Check for: Redness, swelling, or pain. Fluid or blood. Warmth. Pus or a bad smell. Managing pain, stiffness, and swelling If directed, put ice on the affected area. Put ice in a plastic bag. Place a towel between your skin and the bag. Leave the ice on for 20 minutes, 2-3 times a day. General instructions If you were asked to stop your regular medicines, ask your health care provider when you may start taking them again. Return to your normal activities as told by your health care provider. Ask your health care provider what activities are safe for you. Do not take baths, swim, or use a hot tub until your health care provider approves. You may be asked to see an occupational or physical therapist for exercises that reduce muscle strain and stretch the area of the trigger point. Keep all follow-up visits as told by your health care provider. This is important. Contact a  health care provider if: Your pain comes back, and it is worse than before the injection. You may need more injections. You have chills or a fever. The injection site becomes more painful, red, swollen, or warm to the touch. Summary A  trigger point injection is a shot given in the trigger point to help relieve pain for a few days to a few months. Common places for trigger point injections are the neck, shoulder, upper back, and lower back. These injections do not always work for every person, but for some people, the injections can help to relieve pain for a few days to a few months. Contact a health care provider if symptoms come back or they are worse than before treatment. Also, get help if the injection site becomes more painful, red, swollen, or warm to the touch. This information is not intended to replace advice given to you by your health care provider. Make sure you discuss any questions you have with your healthcare provider. Document Revised: 11/09/2018 Document Reviewed: 11/09/2018 Elsevier Patient Education  [employer].

## 2021-04-28 ENCOUNTER — Other Ambulatory Visit (HOSPITAL_COMMUNITY): Payer: Self-pay

## 2021-04-28 DIAGNOSIS — M542 Cervicalgia: Secondary | ICD-10-CM | POA: Diagnosis not present

## 2021-04-28 MED ORDER — MELOXICAM 7.5 MG PO TABS
7.5000 mg | ORAL_TABLET | Freq: Every day | ORAL | 2 refills | Status: DC
  Filled 2021-04-28: qty 30, 30d supply, fill #0

## 2021-05-05 ENCOUNTER — Other Ambulatory Visit (HOSPITAL_COMMUNITY): Payer: Self-pay

## 2021-05-14 ENCOUNTER — Other Ambulatory Visit (HOSPITAL_COMMUNITY): Payer: Self-pay

## 2021-05-14 MED ORDER — CELECOXIB 200 MG PO CAPS
200.0000 mg | ORAL_CAPSULE | Freq: Every day | ORAL | 1 refills | Status: DC
  Filled 2021-05-14: qty 30, 30d supply, fill #0

## 2021-06-05 ENCOUNTER — Other Ambulatory Visit (HOSPITAL_COMMUNITY): Payer: Self-pay

## 2021-07-11 ENCOUNTER — Other Ambulatory Visit (HOSPITAL_COMMUNITY): Payer: Self-pay

## 2021-07-15 ENCOUNTER — Other Ambulatory Visit (HOSPITAL_COMMUNITY): Payer: Self-pay

## 2021-07-15 DIAGNOSIS — Z1389 Encounter for screening for other disorder: Secondary | ICD-10-CM | POA: Diagnosis not present

## 2021-07-15 DIAGNOSIS — Z1231 Encounter for screening mammogram for malignant neoplasm of breast: Secondary | ICD-10-CM | POA: Diagnosis not present

## 2021-07-15 DIAGNOSIS — Z130 Encounter for screening for diseases of the blood and blood-forming organs and certain disorders involving the immune mechanism: Secondary | ICD-10-CM | POA: Diagnosis not present

## 2021-07-15 DIAGNOSIS — Z124 Encounter for screening for malignant neoplasm of cervix: Secondary | ICD-10-CM | POA: Diagnosis not present

## 2021-07-15 DIAGNOSIS — N898 Other specified noninflammatory disorders of vagina: Secondary | ICD-10-CM | POA: Diagnosis not present

## 2021-07-15 DIAGNOSIS — A609 Anogenital herpesviral infection, unspecified: Secondary | ICD-10-CM | POA: Diagnosis not present

## 2021-07-15 DIAGNOSIS — Z01411 Encounter for gynecological examination (general) (routine) with abnormal findings: Secondary | ICD-10-CM | POA: Diagnosis not present

## 2021-07-15 DIAGNOSIS — Z202 Contact with and (suspected) exposure to infections with a predominantly sexual mode of transmission: Secondary | ICD-10-CM | POA: Diagnosis not present

## 2021-07-15 DIAGNOSIS — Z1151 Encounter for screening for human papillomavirus (HPV): Secondary | ICD-10-CM | POA: Diagnosis not present

## 2021-07-15 LAB — RESULTS CONSOLE HPV: CHL HPV: NEGATIVE

## 2021-07-15 LAB — HM PAP SMEAR: HM Pap smear: NEGATIVE

## 2021-07-15 MED ORDER — VALACYCLOVIR HCL 500 MG PO TABS
ORAL_TABLET | ORAL | 2 refills | Status: DC
  Filled 2021-07-15: qty 90, 72d supply, fill #0

## 2021-07-31 ENCOUNTER — Other Ambulatory Visit (HOSPITAL_COMMUNITY): Payer: Self-pay

## 2021-08-08 ENCOUNTER — Other Ambulatory Visit (HOSPITAL_COMMUNITY): Payer: Self-pay

## 2021-08-12 ENCOUNTER — Other Ambulatory Visit (HOSPITAL_COMMUNITY): Payer: Self-pay

## 2021-09-15 ENCOUNTER — Other Ambulatory Visit (HOSPITAL_COMMUNITY): Payer: Self-pay

## 2021-10-17 ENCOUNTER — Other Ambulatory Visit (HOSPITAL_COMMUNITY): Payer: Self-pay

## 2021-11-03 DIAGNOSIS — E039 Hypothyroidism, unspecified: Secondary | ICD-10-CM | POA: Diagnosis not present

## 2021-11-03 DIAGNOSIS — E8881 Metabolic syndrome: Secondary | ICD-10-CM | POA: Diagnosis not present

## 2021-11-10 ENCOUNTER — Other Ambulatory Visit (HOSPITAL_COMMUNITY): Payer: Self-pay

## 2021-11-10 DIAGNOSIS — N926 Irregular menstruation, unspecified: Secondary | ICD-10-CM | POA: Diagnosis not present

## 2021-11-10 DIAGNOSIS — E8881 Metabolic syndrome: Secondary | ICD-10-CM | POA: Diagnosis not present

## 2021-11-10 DIAGNOSIS — E039 Hypothyroidism, unspecified: Secondary | ICD-10-CM | POA: Diagnosis not present

## 2021-11-10 MED ORDER — NP THYROID 15 MG PO TABS
15.0000 mg | ORAL_TABLET | Freq: Every day | ORAL | 6 refills | Status: DC
  Filled 2021-11-10: qty 30, 30d supply, fill #0
  Filled 2021-12-09: qty 30, 30d supply, fill #1

## 2021-11-10 MED ORDER — NP THYROID 30 MG PO TABS
30.0000 mg | ORAL_TABLET | Freq: Every day | ORAL | 6 refills | Status: DC
  Filled 2021-11-10: qty 30, 30d supply, fill #0
  Filled 2021-12-09: qty 30, 30d supply, fill #1

## 2021-11-11 ENCOUNTER — Other Ambulatory Visit (HOSPITAL_COMMUNITY): Payer: Self-pay

## 2021-11-28 DIAGNOSIS — H52203 Unspecified astigmatism, bilateral: Secondary | ICD-10-CM | POA: Diagnosis not present

## 2021-11-28 DIAGNOSIS — H5213 Myopia, bilateral: Secondary | ICD-10-CM | POA: Diagnosis not present

## 2021-12-09 ENCOUNTER — Other Ambulatory Visit (HOSPITAL_COMMUNITY): Payer: Self-pay

## 2022-01-07 DIAGNOSIS — E039 Hypothyroidism, unspecified: Secondary | ICD-10-CM | POA: Diagnosis not present

## 2022-01-08 ENCOUNTER — Other Ambulatory Visit (HOSPITAL_COMMUNITY): Payer: Self-pay

## 2022-01-08 MED ORDER — NP THYROID 60 MG PO TABS
60.0000 mg | ORAL_TABLET | Freq: Every day | ORAL | 6 refills | Status: DC
  Filled 2022-01-08: qty 30, 30d supply, fill #0
  Filled 2022-02-05: qty 30, 30d supply, fill #1
  Filled 2022-03-05: qty 30, 30d supply, fill #2
  Filled 2022-04-08: qty 30, 30d supply, fill #3
  Filled 2022-05-08: qty 30, 30d supply, fill #4
  Filled 2022-06-09: qty 30, 30d supply, fill #5

## 2022-02-05 ENCOUNTER — Other Ambulatory Visit (HOSPITAL_COMMUNITY): Payer: Self-pay

## 2022-02-23 ENCOUNTER — Encounter: Payer: 59 | Admitting: Nurse Practitioner

## 2022-02-23 NOTE — Progress Notes (Deleted)
Complete Physical  Assessment and Plan:  Encounter for general adult medical examination with abnormal findings 1 year  Tobacco abuse disorder Smoking cessation-  instruction/counseling given, counseled patient on the dangers of tobacco use, advised patient to stop smoking, and reviewed strategies to maximize success, patient not ready to quit at this time.  -     EKG 12-Lead - declined this year -     DG Chest 2 View; Future - declined this year  Chronic obstructive pulmonary disease, unspecified COPD type ([place]) No triggers, well controlled symptoms, cont to monitor STOP SMOKING, check CXR routinely, start lung cancer screening age 50  Atherosclerosis of aorta ([place]) Control blood pressure, cholesterol, glucose, increase exercise.  STOP SMOKING -     Lipid panel  Hypothyroidism, unspecified type Dr. Chalmers Cater follows, has upcoming appointment, defer TSH Continue thyroid medication as recommended  Depression, major, recurrent, in partial remission (HCC)/ Anxiety  She attributes to pain state, reports mood is fair. Declines interventions.  Stress management techniques discussed, increase water, good sleep hygiene discussed, increase exercise, and increase veggies.  -     TSH  B12 deficiency -      continue B12 supplement, check level PRN  Vitamin D deficiency -     VITAMIN D 25 Hydroxy (Vit-D Deficiency, Fractures)  Constipation, unspecified constipation type Following up with Dr Ardis Hughs  History of hemorrhoids Follow up Dr. Ardis Hughs  Gastroesophageal reflux disease with esophagitis Continue PPI/H2 blocker, diet discussed  Genital herpes simplex, unspecified site Monitor, antiviral PRN  Medication management -     CBC with Differential/Platelet -     COMPLETE METABOLIC PANEL WITH GFR -     Magnesium  Screening cholesterol level -     Lipid panel  Screening for diabetes - A1c  Screening for hematuria or proteinuria -     Urinalysis, Routine w reflex microscopic -   Microalbumin/creatinine urine ratio  Screening for ischemic heart disease - EKG  Cervical spondylosis/chronic neck pain With consequent tension/cervical HA After discussion will refer to ortho for further interventions, possible steroid injections, PT as recommended.  Declines steroid taper today; continue OTC analgesics, has tramadol PRN if severe  Discussed med's effects and SE's. Screening labs and tests as requested with regular follow-up as recommended. Over 40 minutes of exam, counseling, chart review and critical decision making was performed  Future Appointments  Date Time Provider [place]  02/24/2022 10:00 AM Magda Bernheim, NP [facility] None     HPI  This very nice 51 y.o.female presents for complete physical. She has Depression, major, recurrent, in partial remission ([place]); GERD (gastroesophageal reflux disease); Genital herpes; B12 deficiency; Vitamin D deficiency; Neck pain on right side; Headache, tension-type; Tobacco abuse disorder; COPD (chronic obstructive pulmonary disease) ([place]); Atherosclerosis of aorta ([place]); Constipation; ANA positive; HLA B27 (HLA B27 positive); Hypothyroidism; and Cervical spondylolysis on their problem list.  She works as Charity fundraiser, [employer], married, 2 grown kids, expecting first grandchild soon.   Follows with GYN - follows with Dr. Paulene Floor office annually.   She has had recurrent neck pain, most recently flaring since Feb 2022, improved with steroid IM but only helped for 1 week, has tried tylenol, ibuprofen, goody powders, has been prescribed tramadol that takes sparingly when severe pain. She had steroid taper recently that did resolve pain temporarily. She has xray from 04/2019 showing multilevel spondylosis and facet joint arthropathy. She saw Dr. Estil Daft 05/16/2019 for + ANA and HLB27 but felt no signs of autoimmune OA at this  time.   She has consequent tension HA that is limiting and affecting her mood. Improves  but doesn't resolve in AM, worse after work. Denies radicular sx.   She has started back to smoking. stopped 2017, started back 2021, 32 years, 0.5 pack on average. Not interested in quitting at this time.   BMI is There is no height or weight on file to calculate BMI., she has not been working on diet and exercise, tries to eat healthy, veggies, protein.  2 bottles of water per day, 3 small cans of regular soda - sprite Wt Readings from Last 3 Encounters:  04/23/21 133 lb 9.6 oz (60.6 kg)  02/20/21 138 lb 9.6 oz (62.9 kg)  12/24/20 135 lb (61.2 kg)   Her blood pressure has been controlled at home, today their BP is    She does not workout, she was walking her dogs. She denies chest pain, shortness of breath, dizziness.  She has aortic atherosclerosis per CXR 2017   She is not on cholesterol medication and denies myalgias. Her cholesterol is at goal. The cholesterol last visit was:   Lab Results  Component Value Date   CHOL 143 02/20/2021   HDL 45 (L) 02/20/2021   LDLCALC 77 02/20/2021   TRIG 124 02/20/2021   CHOLHDL 3.2 02/20/2021   Last A1C in the office was:  Lab Results  Component Value Date   HGBA1C 4.9 02/21/2020   Patient is on Vitamin D supplement, she is on 5000 IU of vitamin D daily Lab Results  Component Value Date   VD25OH 67 02/20/2021     Was on B12 injections in the past, she is on B12 sublingual, taking 5000 mcg daily Lab Results  Component Value Date   VITAMINB12 >2,000 (H) 02/21/2020   She is on thyroid medication. Her medication was not changed last visit, taking 30 mg armour thryoid, Dr. Chalmers Cater follows, has upcoming appointment  Lab Results  Component Value Date   TSH 1.44 04/01/2020      Current Medications:  Current Outpatient Medications on File Prior to Visit  Medication Sig Dispense Refill   celecoxib (CELEBREX) 200 MG capsule Take 1 capsule by mouth once a day 30 capsule 1   meloxicam (MOBIC) 7.5 MG tablet Take 1 tablet by mouth once a day  30 tablet 2   thyroid (NP THYROID) 15 MG tablet Take 1 tablet (15 mg total) by mouth daily on an empty stomach 30 tablet 6   thyroid (NP THYROID) 30 MG tablet Take 1 tablet (30 mg total) by mouth daily on an empty stomach 30 tablet 6   thyroid (NP THYROID) 60 MG tablet Take 1 tablet (60 mg total) by mouth daily on an empty stomach 30 tablet 6   traMADol (ULTRAM) 50 MG tablet TAKE 2 TABLETS (100 MG TOTAL) BY MOUTH EVERY 6 (SIX) HOURS AS NEEDED FOR UP TO 5 DAYS FOR MODERATE PAIN OR SEVERE PAIN. 30 tablet 0   valACYclovir (VALTREX) 500 MG tablet as needed.     valACYclovir (VALTREX) 500 MG tablet TAKE 1 TABLET BY MOUTH ONCE DAILY FOR SUPPRESSION. TAKE TWICE DAILY FOR 3 DAYS FOR OUTBREAK 90 tablet 2   No current facility-administered medications on file prior to visit.   Health Maintenance:   Immunization History  Administered Date(s) Administered   Influenza Split 07/18/2012   Influenza-Unspecified 07/11/2013   Tdap 02/20/2021   TD/TDAP: Tdap today  Influenza: gets at work, last 2021 Pneumovax:  Defer,  COVID 19: 2/2, pfizer -  send information   LMP: No LMP recorded. Pap:  Has GYN, Joycelyn Rua, last 2021? Sees annually  MGM: 2021 by GYN  Colonoscopy 2020 Dr. Ardis Hughs, plyps, 7 year recall  EGD 08/2014  Last eye: Glasses, goes annually, Dr. ? Last dental: goes q1m 2022 Last derm: 2022, Dr. 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 Surgical History:  She  has a  past surgical history that includes Nose surgery (1991); Tubal ligation (Bilateral, 1988); Colonoscopy; and Esophagogastroduodenoscopy. Family History:  Her family history includes Arthritis in her brother; Bladder Cancer in her father; Breast cancer in her cousin; COPD in her father; Cancer in her paternal uncle; Colon cancer in her paternal aunt; Fibromyalgia in her mother; Healthy in her daughter and son; Heart disease in her maternal grandfather; Hypertension in her father and mother; Pancreatic cancer (age of onset: 764 in her mother; Thyroid disease in her brother. Social History:   reports that she has been smoking cigarettes. She started smoking about 38 years ago. She has a 16.50 pack-year smoking history. She has never used smokeless tobacco. She reports that she does not drink alcohol and does not use drugs.  Review of Systems: Review of Systems  Constitutional:  Negative for chills, fever and weight loss.  HENT:  Negative for congestion, ear discharge, ear pain, hearing loss, nosebleeds, sore throat and tinnitus.   Eyes: Negative.  Negative for blurred vision and double vision.  Respiratory: Negative.  Negative for cough, shortness of breath, wheezing and stridor.   Cardiovascular: Negative.  Negative for chest pain, palpitations, orthopnea, claudication and leg swelling.  Gastrointestinal: Negative.  Negative for abdominal pain, blood in stool, constipation, diarrhea, heartburn, melena, nausea and vomiting.  Genitourinary: Negative.   Musculoskeletal:  Positive for neck pain (R, persistent 3 months, recurrent). Negative for back pain, joint pain and myalgias.  Skin: Negative.  Negative for rash.  Neurological:  Positive for headaches (with neck pain). Negative for dizziness, tingling, sensory change, focal weakness and weakness.  Endo/Heme/Allergies: Negative.  Negative for polydipsia.  Psychiatric/Behavioral:  Negative for depression, substance abuse and suicidal ideas. The patient  has insomnia (secondary to pain). The patient is not nervous/anxious.   All other systems reviewed and are negative.  Physical Exam: Estimated body mass index is 23.29 kg/m as calculated from the following:   Height as of 02/20/21: 5' 3.5" (1.613 m).   Weight as of 04/23/21: 133 lb 9.6 oz (60.6 kg). There were no vitals taken for this visit. General Appearance: Well nourished, in no apparent distress.  Eyes: PERRLA, EOMs, conjunctiva no swelling or erythema, normal fundi and vessels.  Sinuses: No Frontal/maxillary tenderness  ENT/Mouth: Ext aud canals clear, normal light reflex with TMs without erythema, bulging. Good dentition without abscesses. No erythema, swelling, or exudate on post pharynx. Tonsils not swollen or erythematous. Hearing normal.  Neck: Supple, thyroid normal. No bruits  Respiratory: Respiratory effort normal, BS equal bilaterally without rales, rhonchi, wheezing or stridor.  Cardio: RRR without murmurs, rubs or gallops. Brisk peripheral pulses without edema.  Breasts: defer to GYN  Abdomen: Soft, nontender, no guarding, rebound, hernias, masses, or organomegaly.  Lymphatics: Non tender without lymphadenopathy.  Genitourinary: defer to GYN Musculoskeletal: Full ROM all peripheral extremities, 5/5 strength, and normal gait. Very tender right SCM/traps, no spinous tenderness, no radicular symptoms.  Skin: Warm, dry without rashes, lesions, ecchymosis. Neuro: Cranial nerves intact, reflexes equal bilaterally. Normal muscle tone, no cerebellar symptoms. Sensation intact.  Psych: Awake and oriented X 3, avoidant, normal affect, Insight and Judgment appropriate.   EKG: WNL in 2021, declines today, will get next year  Brennan Karam W Holli Rengel 10:10 AM [facility]

## 2022-02-24 ENCOUNTER — Encounter: Payer: 59 | Admitting: Nurse Practitioner

## 2022-02-24 DIAGNOSIS — Z13220 Encounter for screening for lipoid disorders: Secondary | ICD-10-CM

## 2022-02-24 DIAGNOSIS — Z0000 Encounter for general adult medical examination without abnormal findings: Secondary | ICD-10-CM

## 2022-02-24 DIAGNOSIS — E039 Hypothyroidism, unspecified: Secondary | ICD-10-CM

## 2022-02-24 DIAGNOSIS — F3341 Major depressive disorder, recurrent, in partial remission: Secondary | ICD-10-CM

## 2022-02-24 DIAGNOSIS — K5900 Constipation, unspecified: Secondary | ICD-10-CM

## 2022-02-24 DIAGNOSIS — J449 Chronic obstructive pulmonary disease, unspecified: Secondary | ICD-10-CM

## 2022-02-24 DIAGNOSIS — E538 Deficiency of other specified B group vitamins: Secondary | ICD-10-CM

## 2022-02-24 DIAGNOSIS — A6000 Herpesviral infection of urogenital system, unspecified: Secondary | ICD-10-CM

## 2022-02-24 DIAGNOSIS — E559 Vitamin D deficiency, unspecified: Secondary | ICD-10-CM

## 2022-02-24 DIAGNOSIS — Z8719 Personal history of other diseases of the digestive system: Secondary | ICD-10-CM

## 2022-02-24 DIAGNOSIS — Z131 Encounter for screening for diabetes mellitus: Secondary | ICD-10-CM

## 2022-02-24 DIAGNOSIS — I700 Atherosclerosis of aorta: Secondary | ICD-10-CM

## 2022-02-24 DIAGNOSIS — Z1389 Encounter for screening for other disorder: Secondary | ICD-10-CM

## 2022-02-24 DIAGNOSIS — M4302 Spondylolysis, cervical region: Secondary | ICD-10-CM

## 2022-02-24 DIAGNOSIS — F419 Anxiety disorder, unspecified: Secondary | ICD-10-CM

## 2022-02-24 DIAGNOSIS — K2100 Gastro-esophageal reflux disease with esophagitis, without bleeding: Secondary | ICD-10-CM

## 2022-02-24 DIAGNOSIS — Z136 Encounter for screening for cardiovascular disorders: Secondary | ICD-10-CM

## 2022-02-25 ENCOUNTER — Other Ambulatory Visit (HOSPITAL_COMMUNITY): Payer: Self-pay

## 2022-02-25 ENCOUNTER — Encounter: Payer: Self-pay | Admitting: Nurse Practitioner

## 2022-02-25 ENCOUNTER — Ambulatory Visit (INDEPENDENT_AMBULATORY_CARE_PROVIDER_SITE_OTHER): Payer: 59 | Admitting: Nurse Practitioner

## 2022-02-25 VITALS — BP 102/60 | HR 78 | Temp 97.7°F | Ht 63.5 in | Wt 135.8 lb

## 2022-02-25 DIAGNOSIS — I10 Essential (primary) hypertension: Secondary | ICD-10-CM

## 2022-02-25 DIAGNOSIS — A6000 Herpesviral infection of urogenital system, unspecified: Secondary | ICD-10-CM

## 2022-02-25 DIAGNOSIS — M25562 Pain in left knee: Secondary | ICD-10-CM

## 2022-02-25 DIAGNOSIS — E039 Hypothyroidism, unspecified: Secondary | ICD-10-CM

## 2022-02-25 DIAGNOSIS — Z1389 Encounter for screening for other disorder: Secondary | ICD-10-CM

## 2022-02-25 DIAGNOSIS — K2100 Gastro-esophageal reflux disease with esophagitis, without bleeding: Secondary | ICD-10-CM | POA: Diagnosis not present

## 2022-02-25 DIAGNOSIS — Z8719 Personal history of other diseases of the digestive system: Secondary | ICD-10-CM

## 2022-02-25 DIAGNOSIS — Z0000 Encounter for general adult medical examination without abnormal findings: Secondary | ICD-10-CM | POA: Diagnosis not present

## 2022-02-25 DIAGNOSIS — J449 Chronic obstructive pulmonary disease, unspecified: Secondary | ICD-10-CM | POA: Diagnosis not present

## 2022-02-25 DIAGNOSIS — F3341 Major depressive disorder, recurrent, in partial remission: Secondary | ICD-10-CM

## 2022-02-25 DIAGNOSIS — K5900 Constipation, unspecified: Secondary | ICD-10-CM

## 2022-02-25 DIAGNOSIS — Z13220 Encounter for screening for lipoid disorders: Secondary | ICD-10-CM

## 2022-02-25 DIAGNOSIS — M25561 Pain in right knee: Secondary | ICD-10-CM

## 2022-02-25 DIAGNOSIS — Z720 Tobacco use: Secondary | ICD-10-CM

## 2022-02-25 DIAGNOSIS — Z136 Encounter for screening for cardiovascular disorders: Secondary | ICD-10-CM

## 2022-02-25 DIAGNOSIS — Z0001 Encounter for general adult medical examination with abnormal findings: Secondary | ICD-10-CM

## 2022-02-25 DIAGNOSIS — E538 Deficiency of other specified B group vitamins: Secondary | ICD-10-CM

## 2022-02-25 DIAGNOSIS — Z131 Encounter for screening for diabetes mellitus: Secondary | ICD-10-CM | POA: Diagnosis not present

## 2022-02-25 DIAGNOSIS — E559 Vitamin D deficiency, unspecified: Secondary | ICD-10-CM

## 2022-02-25 DIAGNOSIS — Z79899 Other long term (current) drug therapy: Secondary | ICD-10-CM

## 2022-02-25 DIAGNOSIS — M4302 Spondylolysis, cervical region: Secondary | ICD-10-CM

## 2022-02-25 DIAGNOSIS — G8929 Other chronic pain: Secondary | ICD-10-CM

## 2022-02-25 DIAGNOSIS — I700 Atherosclerosis of aorta: Secondary | ICD-10-CM

## 2022-02-25 MED ORDER — ESCITALOPRAM OXALATE 10 MG PO TABS
10.0000 mg | ORAL_TABLET | Freq: Every day | ORAL | 2 refills | Status: DC
  Filled 2022-02-25: qty 30, 30d supply, fill #0
  Filled 2022-03-26: qty 30, 30d supply, fill #1
  Filled 2022-04-24: qty 30, 30d supply, fill #2
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
## 2022-02-25 NOTE — Patient Instructions (Signed)
Escitalopram Tablets What is this medication? ESCITALOPRAM (es sye TAL oh pram) treats depression and anxiety. It increases the amount of serotonin in the brain, a hormone that helps regulate mood. It belongs to a group of medications called SSRIs. This medicine may be used for other purposes; ask your health care provider or pharmacist if you have questions. COMMON BRAND NAME(S): Lexapro What should I tell my care team before I take this medication? They need to know if you have any of these conditions: Bipolar disorder or a family history of bipolar disorder Diabetes Glaucoma Heart disease Kidney or liver disease Receiving electroconvulsive therapy Seizures Suicidal thoughts, plans, or attempt by you or a family member An unusual or allergic reaction to escitalopram, the related medication citalopram, other medications, foods, dyes, or preservatives Pregnant or trying to become pregnant Breast-feeding How should I use this medication? Take this medication by mouth with a glass of water. Follow the directions on the prescription label. You can take it with or without food. If it upsets your stomach, take it with food. Take your medication at regular intervals. Do not take it more often than directed. Do not stop taking this medication suddenly except upon the advice of your care team. Stopping this medication too quickly may cause serious side effects or your condition may worsen. A special MedGuide will be given to you by the pharmacist with each prescription and refill. Be sure to read this information carefully each time. Talk to your care team regarding the use of this medication in children. Special care may be needed. Overdosage: If you think you have taken too much of this medicine contact a poison control center or emergency room at once. NOTE: This medicine is only for you. Do not share this medicine with others. What if I miss a dose? If you miss a dose, take it as soon as you  can. If it is almost time for your next dose, take only that dose. Do not take double or extra doses. What may interact with this medication? Do not take this medication with any of the following: Certain medications for fungal infections like fluconazole, itraconazole, ketoconazole, posaconazole, voriconazole Cisapride Citalopram Dronedarone Linezolid MAOIs like Carbex, Eldepryl, Marplan, Nardil, and Parnate Methylene blue (injected into a vein) Pimozide Thioridazine This medication may also interact with the following: Alcohol Amphetamines Aspirin and aspirin-like medications Carbamazepine Certain medications for depression, anxiety, or psychotic disturbances Certain medications for migraine headache like almotriptan, eletriptan, frovatriptan, naratriptan, rizatriptan, sumatriptan, zolmitriptan Certain medications for sleep Certain medications that treat or prevent blood clots like warfarin, enoxaparin, dalteparin Cimetidine Diuretics Dofetilide Fentanyl Furazolidone Isoniazid Lithium Metoprolol NSAIDs, medications for pain and inflammation, like ibuprofen or naproxen Other medications that prolong the QT interval (cause an abnormal heart rhythm) Procarbazine Rasagiline Supplements like St. John's wort, kava kava, valerian Tramadol Tryptophan Ziprasidone This list may not describe all possible interactions. Give your health care provider a list of all the medicines, herbs, non-prescription drugs, or dietary supplements you use. Also tell them if you smoke, drink alcohol, or use illegal drugs. Some items may interact with your medicine. What should I watch for while using this medication? Tell your care team if your symptoms do not get better or if they get worse. Visit your care team for regular checks on your progress. Because it may take several weeks to see the full effects of this medication, it is important to continue your treatment as prescribed by your care  team. Watch for new or worsening   thoughts of suicide or depression. This includes sudden changes in mood, behaviors, or thoughts. These changes can happen at any time but are more common in the beginning of treatment or after a change in dose. Call your care team right away if you experience these thoughts or worsening depression. ?Manic episodes may happen in patients with bipolar disorder who take this medication. Watch for changes in feelings or behaviors such as feeling anxious, nervous, agitated, panicky, irritable, hostile, aggressive, impulsive, severely restless, overly excited and hyperactive, or trouble sleeping. These symptoms can happen at any time but are more common in the beginning of treatment or after a change in dose. Call your care team right away if you notice any of these symptoms. ?You may get drowsy or dizzy. Do not drive, use machinery, or do anything that needs mental alertness until you know how this medication affects you. Do not stand or sit up quickly, especially if you are an older patient. This reduces the risk of dizzy or fainting spells. Alcohol may interfere with the effect of this medication. Avoid alcoholic drinks. ?Your mouth may get dry. Chewing sugarless gum or sucking hard candy, and drinking plenty of water may help. Contact your care team if the problem does not go away or is severe. ?What side effects may I notice from receiving this medication? ?Side effects that you should report to your care team as soon as possible: ?Allergic reactions--skin rash, itching, hives, swelling of the face, lips, tongue, or throat ?Bleeding--bloody or black, tar-like stools, red or dark brown urine, vomiting blood or brown material that looks like coffee grounds, small, red or purple spots on skin, unusual bleeding or bruising ?Heart rhythm changes--fast or irregular heartbeat, dizziness, feeling faint or lightheaded, chest pain, trouble breathing ?Low sodium level--muscle weakness, fatigue,  dizziness, headache, confusion ?Serotonin syndrome--irritability, confusion, fast or irregular heartbeat, muscle stiffness, twitching muscles, sweating, high fever, seizure, chills, vomiting, diarrhea ?Sudden eye pain or change in vision such as blurry vision, seeing halos around lights, vision loss ?Thoughts of suicide or self-harm, worsening mood, feelings of depression ?Side effects that usually do not require medical attention (report to your care team if they continue or are bothersome): ?Change in sex drive or performance ?Diarrhea ?Excessive sweating ?Nausea ?Tremors or shaking ?Upset stomach ?This list may not describe all possible side effects. Call your doctor for medical advice about side effects. You may report side effects to FDA at 1-800-FDA-1088. ?Where should I keep my medication? ?Keep out of reach of children and pets. ?Store at room temperature between 15 and 30 degrees C (59 and 86 degrees F). Throw away any unused medication after the expiration date. ?NOTE: This sheet is a summary. It may not cover all possible information. If you have questions about this medicine, talk to your doctor, pharmacist, or health care provider. ?? 2023 Elsevier/Gold Standard (2020-09-16 00:00:00) ? ? ?Glucosamine; Chondroitin Capsules or Tablets ?What is this medication? ?GLUCOSAMINE; CHONDROITIN (gloo KOH suh meen; chon DROI tin) may support joint health. It could slow the breakdown of cartilage in the joints, which may reduce pain and swelling. The FDA has not evaluated this supplement for any medical use. It may contain ingredients not listed. Discuss all supplements you are taking with your care team. They can provide you with important safety information. ?This medicine may be used for other purposes; ask your health care provider or pharmacist if you have questions. ?What should I tell my care team before I take this medication? ?They need to  know if you have any of these conditions: ?Diabetes ?Heart  disease ?Kidney disease ?Liver disease ?Stomach or intestinal problems ?An unusual or allergic reaction to chondroitin, glucosamine, sulfonamides, other supplements, foods, dyes, or preservatives ?Pregnant or trying to ge

## 2022-02-26 LAB — COMPLETE METABOLIC PANEL WITH GFR
AG Ratio: 1.7 (calc) (ref 1.0–2.5)
ALT: 10 U/L (ref 6–29)
AST: 16 U/L (ref 10–35)
Albumin: 4.2 g/dL (ref 3.6–5.1)
Alkaline phosphatase (APISO): 53 U/L (ref 37–153)
BUN: 16 mg/dL (ref 7–25)
CO2: 28 mmol/L (ref 20–32)
Calcium: 9.4 mg/dL (ref 8.6–10.4)
Chloride: 104 mmol/L (ref 98–110)
Creat: 0.66 mg/dL (ref 0.50–1.03)
Globulin: 2.5 g/dL (calc) (ref 1.9–3.7)
Glucose, Bld: 90 mg/dL (ref 65–99)
Potassium: 4.4 mmol/L (ref 3.5–5.3)
Sodium: 140 mmol/L (ref 135–146)
Total Bilirubin: 0.3 mg/dL (ref 0.2–1.2)
Total Protein: 6.7 g/dL (ref 6.1–8.1)
eGFR: 107 mL/min/{1.73_m2} (ref >=60)

## 2022-02-26 LAB — CBC WITH DIFFERENTIAL/PLATELET
Absolute Monocytes: 444 cells/uL (ref 200–950)
Basophils Absolute: 51 cells/uL (ref 0–200)
Basophils Relative: 1 %
Eosinophils Absolute: 82 cells/uL (ref 15–500)
Eosinophils Relative: 1.6 %
HCT: 36.6 % (ref 35.0–45.0)
Hemoglobin: 12.2 g/dL (ref 11.7–15.5)
Lymphs Abs: 1821 cells/uL (ref 850–3900)
MCH: 30 pg (ref 27.0–33.0)
MCHC: 33.3 g/dL (ref 32.0–36.0)
MCV: 89.9 fL (ref 80.0–100.0)
MPV: 11.2 fL (ref 7.5–12.5)
Monocytes Relative: 8.7 %
Neutro Abs: 2703 cells/uL (ref 1500–7800)
Neutrophils Relative %: 53 %
Platelets: 184 10*3/uL (ref 140–400)
RBC: 4.07 10*6/uL (ref 3.80–5.10)
RDW: 11.7 % (ref 11.0–15.0)
Total Lymphocyte: 35.7 %
WBC: 5.1 10*3/uL (ref 3.8–10.8)

## 2022-02-26 LAB — MICROALBUMIN / CREATININE URINE RATIO
Creatinine, Urine: 142 mg/dL (ref 20–275)
Microalb Creat Ratio: 7 mcg/mg creat (ref <30)
Microalb, Ur: 1 mg/dL

## 2022-02-26 LAB — LIPID PANEL
Cholesterol: 138 mg/dL (ref <200)
HDL: 44 mg/dL — ABNORMAL LOW (ref >=50)
LDL Cholesterol (Calc): 72 mg/dL (calc)
Non-HDL Cholesterol (Calc): 94 mg/dL (calc) (ref <130)
Total CHOL/HDL Ratio: 3.1 (calc) (ref <5.0)
Triglycerides: 138 mg/dL (ref <150)

## 2022-02-26 LAB — URINALYSIS, ROUTINE W REFLEX MICROSCOPIC
Bilirubin Urine: NEGATIVE
Glucose, UA: NEGATIVE
Hgb urine dipstick: NEGATIVE
Ketones, ur: NEGATIVE
Leukocytes,Ua: NEGATIVE
Nitrite: NEGATIVE
Protein, ur: NEGATIVE
Specific Gravity, Urine: 1.024 (ref 1.001–1.035)
pH: 6 (ref 5.0–8.0)

## 2022-02-26 LAB — MAGNESIUM: Magnesium: 1.9 mg/dL (ref 1.5–2.5)

## 2022-02-26 LAB — HEMOGLOBIN A1C
Hgb A1c MFr Bld: 5.1 % of total Hgb (ref <5.7)
Mean Plasma Glucose: 100 mg/dL
eAG (mmol/L): 5.5 mmol/L

## 2022-02-26 LAB — VITAMIN D 25 HYDROXY (VIT D DEFICIENCY, FRACTURES): Vit D, 25-Hydroxy: 33 ng/mL (ref 30–100)

## 2022-03-05 ENCOUNTER — Other Ambulatory Visit (HOSPITAL_COMMUNITY): Payer: Self-pay

## 2022-03-11 DIAGNOSIS — E039 Hypothyroidism, unspecified: Secondary | ICD-10-CM | POA: Diagnosis not present

## 2022-03-26 ENCOUNTER — Other Ambulatory Visit (HOSPITAL_COMMUNITY): Payer: Self-pay

## 2022-04-08 ENCOUNTER — Other Ambulatory Visit (HOSPITAL_COMMUNITY): Payer: Self-pay

## 2022-04-17 DIAGNOSIS — L03019 Cellulitis of unspecified finger: Secondary | ICD-10-CM | POA: Diagnosis not present

## 2022-04-24 ENCOUNTER — Other Ambulatory Visit (HOSPITAL_COMMUNITY): Payer: Self-pay

## 2022-05-08 ENCOUNTER — Other Ambulatory Visit (HOSPITAL_COMMUNITY): Payer: Self-pay

## 2022-05-26 ENCOUNTER — Telehealth: Payer: Self-pay | Admitting: Nurse Practitioner

## 2022-05-26 ENCOUNTER — Other Ambulatory Visit (HOSPITAL_COMMUNITY): Payer: Self-pay

## 2022-05-26 ENCOUNTER — Other Ambulatory Visit: Payer: Self-pay | Admitting: Nurse Practitioner

## 2022-05-26 ENCOUNTER — Other Ambulatory Visit: Payer: Self-pay

## 2022-05-26 DIAGNOSIS — F3341 Major depressive disorder, recurrent, in partial remission: Secondary | ICD-10-CM

## 2022-05-26 MED ORDER — ESCITALOPRAM OXALATE 10 MG PO TABS
10.0000 mg | ORAL_TABLET | Freq: Every day | ORAL | 2 refills | Status: DC
  Filled 2022-05-26: qty 30, 30d supply, fill #0

## 2022-05-26 NOTE — Telephone Encounter (Signed)
Pt is requesting a refill on Lexapro, she said she only has 2 pills left. Pharmacy is [facility]

## 2022-05-27 ENCOUNTER — Other Ambulatory Visit (HOSPITAL_COMMUNITY): Payer: Self-pay

## 2022-05-29 ENCOUNTER — Ambulatory Visit: Payer: 59 | Admitting: Nurse Practitioner

## 2022-06-03 NOTE — Progress Notes (Unsigned)
 3 MONTH FOLLOW UP  Assessment and Plan:   Tobacco abuse disorder Smoking cessation-  instruction/counseling given, counseled patient on the dangers of tobacco use, advised patient to stop smoking, and reviewed strategies to maximize success, patient not ready to quit at this time.  Declines CXR  Chronic obstructive pulmonary disease, unspecified COPD type ([place]) No triggers, well controlled symptoms, cont to monitor STOP SMOKING, check CXR routinely, start lung cancer screening age 36  Atherosclerosis of aorta ([place]) Control blood pressure, cholesterol, glucose, increase exercise.  STOP SMOKING -     Lipid panel  Hypothyroidism, unspecified type Dr. Chalmers Cater follows, has upcoming appointment Continue thyroid medication as recommended -TSH  Depression, major, recurrent, in partial remission (HCC)/ Anxiety  Doing much better since starting Lexapro, continue medication Stress management techniques discussed, increase water, good sleep hygiene discussed, increase exercise, and increase veggies.  Denies thought of hurting self or others  B12 deficiency -      continue B12 supplement, check level PRN  Vitamin D deficiency -     VITAMIN D 25 Hydroxy (Vit-D Deficiency, Fractures)  Constipation, unspecified constipation type Following up with Dr Ardis Hughs Currently well controlled  Chronic left knee pain Continue antiinflammatories If worsens notify the office and will refer to orthopedics   Gastroesophageal reflux disease with esophagitis Currently well controlled without medication   Medication management -     CBC with Differential/Platelet -     COMPLETE METABOLIC PANEL WITH GFR   Cervical spondylosis/chronic neck pain Neck pain is currently improved Continue to monitor   Discussed med's effects and SE's. Screening labs and tests as requested with regular follow-up as recommended. Over 40 minutes of exam, counseling, chart review and critical decision making was  performed  Future Appointments  Date Time Provider [place]  02/26/2023  9:00 AM Alycia Rossetti, NP [facility] None     HPI  This very nice 51 y.o.female presents for 3 month follow up of aortic atherosclerosis, hypothyroidism, COPD, GERD , Depression  She works as Charity fundraiser, [employer], married, 2 grown kids, has a grandchild  Product manager with GYN - follows with Dr. Meisinger's office annually.   She has had recurrent neck pain, most recently flaring since Feb 2022, improved with steroid IM but only helped for 1 week, has tried tylenol, ibuprofen, goody powders, has been prescribed tramadol that takes sparingly when severe pain. She has xray from 04/2019 showing multilevel spondylosis and facet joint arthropathy. She saw Dr. Estil Daft 05/16/2019 for + ANA and HLB27 but felt no signs of autoimmune OA at this time. Currently Neck pain has improved.   She is having bilateral knee pain- pain when walking especially down stairs.  Describes as an aching pain  Has been present less than a year. She is using Goody powders which does help. She believes the left knee is getting a little worse.  Not interested in further evaluation currently  She continues on  60 mg NP thyroid. She does work nights and has done so for 18 years. She is doing very well on Lexapro, sleeping better.  She has started back to smoking. stopped 2017, started back 2021, 32 years, 0.5 pack on average. Smoking some days a little less than 1/2 a pack. Not interested in quitting at this time.   BMI is Body mass index is 22.7 kg/m., she has not been working on diet and exercise, tries to eat healthy, veggies, protein.  Wt Readings from Last 3 Encounters:  06/04/22 130 lb 3.2 oz (59.1  kg)  02/25/22 135 lb 12.8 oz (61.6 kg)  04/23/21 133 lb 9.6 oz (60.6 kg)   Her blood pressure has been controlled at home, today their BP is BP: 94/60  BP Readings from Last 3 Encounters:  06/04/22 94/60  02/25/22 102/60  04/23/21  90/62  She does not workout, she was walking her dogs. She denies chest pain, shortness of breath, dizziness.   She has aortic atherosclerosis per CXR 2017   She is not on cholesterol medication and denies myalgias. Her cholesterol is at goal. The cholesterol last visit was:   Lab Results  Component Value Date   CHOL 138 02/25/2022   HDL 44 (L) 02/25/2022   LDLCALC 72 02/25/2022   TRIG 138 02/25/2022   CHOLHDL 3.1 02/25/2022   Last A1C in the office was:  Lab Results  Component Value Date   HGBA1C 5.1 02/25/2022   Patient is on Vitamin D supplement, she is on 5000 IU of vitamin D daily Lab Results  Component Value Date   VD25OH 33 02/25/2022     Was on B12 injections in the past, she is on B12 sublingual, taking 5000 mcg daily Lab Results  Component Value Date   VITAMINB12 >2,000 (H) 02/21/2020   She is on thyroid medication. Her medication was not changed last visit, taking 60 mg NP thryoid, Dr. Chalmers Cater follows Lab Results  Component Value Date   TSH 1.44 04/01/2020      Current Medications:  Current Outpatient Medications on File Prior to Visit  Medication Sig Dispense Refill   escitalopram (LEXAPRO) 10 MG tablet Take 1 tablet (10 mg total) by mouth daily. 30 tablet 2   thyroid (NP THYROID) 60 MG tablet Take 1 tablet (60 mg total) by mouth daily on an empty stomach 30 tablet 6   traMADol (ULTRAM) 50 MG tablet TAKE 2 TABLETS (100 MG TOTAL) BY MOUTH EVERY 6 (SIX) HOURS AS NEEDED FOR UP TO 5 DAYS FOR MODERATE PAIN OR SEVERE PAIN. 30 tablet 0   valACYclovir (VALTREX) 500 MG tablet TAKE 1 TABLET BY MOUTH ONCE DAILY FOR SUPPRESSION. TAKE TWICE DAILY FOR 3 DAYS FOR OUTBREAK 90 tablet 2   No current facility-administered medications on file prior to visit.   Health Maintenance:   Immunization History  Administered Date(s) Administered   Influenza Split 07/18/2012   Influenza-Unspecified 07/11/2013   Tdap 02/20/2021   TD/TDAP: Tdap today  Influenza: gets at work, last  2021 Pneumovax:  Defer,  COVID 19: 2/2, pfizer - send information   LMP: No LMP recorded. Pap:  Has GYN, Joycelyn Rua, last 2022 Sees annually  MGM: 2022 by GYN  Colonoscopy 2020 Dr. Ardis Hughs, plyps, 7 year recall  EGD 08/2014  Last eye: Glasses, goes annually, Dr. ? Last dental: goes q46m 2022 Last derm: 2022, Dr. ?, hx of ?SCC from face  Allergies:  Allergies  Allergen Reactions   Diflucan [Fluconazole]     Rash Lip swelling   Medical History:  has Depression, major, recurrent, in partial remission ([place]; GERD (gastroesophageal reflux disease); Genital herpes; B12 deficiency; Vitamin D deficiency; Neck pain on right side; Headache, tension-type; Tobacco abuse disorder; COPD (chronic obstructive pulmonary disease) ([place]; Atherosclerosis of aorta ([place]; Constipation; ANA positive; HLA B27 (HLA B27 positive); Hypothyroidism; and Cervical spondylolysis on their problem list. Surgical History:  She  has a past surgical history that includes Nose surgery (1991); Tubal ligation (Bilateral, 1988); Colonoscopy; and Esophagogastroduodenoscopy. Family History:  Her family history includes Arthritis in her brother; Bladder Cancer  in her father; Breast cancer in her cousin; COPD in her father; Cancer in her paternal uncle; Colon cancer in her paternal aunt; Fibromyalgia in her mother; Healthy in her daughter and son; Heart disease in her maternal grandfather; Hypertension in her father and mother; Pancreatic cancer (age of onset: 87) in her mother; Thyroid disease in her brother. Social History:   reports that she has been smoking cigarettes. She started smoking about 38 years ago. She has a 16.50 pack-year smoking history. She has never used smokeless tobacco. She reports that she does not drink alcohol and does not use drugs.  Review of Systems: Review of Systems  Constitutional:  Negative for chills, fever and weight loss.  HENT:  Negative for congestion, ear discharge, ear pain, hearing  loss, nosebleeds, sore throat and tinnitus.   Eyes: Negative.  Negative for blurred vision and double vision.  Respiratory: Negative.  Negative for cough, shortness of breath, wheezing and stridor.   Cardiovascular: Negative.  Negative for chest pain, palpitations, orthopnea, claudication and leg swelling.  Gastrointestinal: Negative.  Negative for abdominal pain, blood in stool, constipation, diarrhea, heartburn, melena, nausea and vomiting.  Genitourinary: Negative.   Musculoskeletal:  Positive for joint pain (knees bilaterally). Negative for back pain, myalgias and neck pain.  Skin: Negative.  Negative for rash.  Neurological:  Negative for dizziness, tingling, sensory change, focal weakness, weakness and headaches.  Endo/Heme/Allergies: Negative.  Negative for polydipsia.  Psychiatric/Behavioral:  Positive for depression (currently well controlled with meds). Negative for substance abuse and suicidal ideas. The patient is not nervous/anxious and does not have insomnia.   All other systems reviewed and are negative.   Physical Exam: Estimated body mass index is 22.7 kg/m as calculated from the following:   Height as of this encounter: 5' 3.5" (1.613 m).   Weight as of this encounter: 130 lb 3.2 oz (59.1 kg). BP 94/60   Pulse 67   Temp 97.9 F (36.6 C)   Ht 5' 3.5" (1.613 m)   Wt 130 lb 3.2 oz (59.1 kg)   SpO2 98%   BMI 22.70 kg/m  General Appearance: Well nourished, in no apparent distress.  Eyes: PERRLA, EOMs, conjunctiva no swelling or erythema, normal fundi and vessels.  Sinuses: No Frontal/maxillary tenderness  ENT/Mouth: Ext aud canals clear, normal light reflex with TMs without erythema, bulging. Good dentition without abscesses. No erythema, swelling, or exudate on post pharynx. Tonsils not swollen or erythematous. Hearing normal.  Neck: Supple, thyroid normal. No bruits  Respiratory: Respiratory effort normal, BS equal bilaterally without rales, rhonchi, wheezing or  stridor.  Cardio: RRR without murmurs, rubs or gallops. Brisk peripheral pulses without edema.  Breasts: defer to GYN Abdomen: Soft, nontender, no guarding, rebound, hernias, masses, or organomegaly.  Lymphatics: Non tender without lymphadenopathy.  Genitourinary: defer to GYN Musculoskeletal: Full ROM all peripheral extremities, 5/5 strength, and normal gait Skin: Warm, dry without rashes, lesions, ecchymosis. Neuro: Cranial nerves intact, reflexes equal bilaterally. Normal muscle tone, no cerebellar symptoms. Sensation intact.  Psych: Awake and oriented X 3, avoidant, flat affect, Insight and Judgment appropriate.   EKG: NSR, no ST changes  Johnston Maddocks E  10:46 AM [facility]

## 2022-06-04 ENCOUNTER — Ambulatory Visit: Payer: 59 | Admitting: Nurse Practitioner

## 2022-06-04 ENCOUNTER — Encounter: Payer: Self-pay | Admitting: Nurse Practitioner

## 2022-06-04 ENCOUNTER — Other Ambulatory Visit (HOSPITAL_COMMUNITY): Payer: Self-pay

## 2022-06-04 VITALS — BP 94/60 | HR 67 | Temp 97.9°F | Ht 63.5 in | Wt 130.2 lb

## 2022-06-04 DIAGNOSIS — E039 Hypothyroidism, unspecified: Secondary | ICD-10-CM

## 2022-06-04 DIAGNOSIS — E559 Vitamin D deficiency, unspecified: Secondary | ICD-10-CM

## 2022-06-04 DIAGNOSIS — K2100 Gastro-esophageal reflux disease with esophagitis, without bleeding: Secondary | ICD-10-CM | POA: Diagnosis not present

## 2022-06-04 DIAGNOSIS — J449 Chronic obstructive pulmonary disease, unspecified: Secondary | ICD-10-CM

## 2022-06-04 DIAGNOSIS — F3341 Major depressive disorder, recurrent, in partial remission: Secondary | ICD-10-CM

## 2022-06-04 DIAGNOSIS — M25562 Pain in left knee: Secondary | ICD-10-CM

## 2022-06-04 DIAGNOSIS — I700 Atherosclerosis of aorta: Secondary | ICD-10-CM | POA: Diagnosis not present

## 2022-06-04 DIAGNOSIS — Z720 Tobacco use: Secondary | ICD-10-CM | POA: Diagnosis not present

## 2022-06-04 DIAGNOSIS — Z79899 Other long term (current) drug therapy: Secondary | ICD-10-CM

## 2022-06-04 DIAGNOSIS — E538 Deficiency of other specified B group vitamins: Secondary | ICD-10-CM | POA: Diagnosis not present

## 2022-06-04 DIAGNOSIS — G8929 Other chronic pain: Secondary | ICD-10-CM

## 2022-06-04 DIAGNOSIS — M542 Cervicalgia: Secondary | ICD-10-CM

## 2022-06-04 MED ORDER — ESCITALOPRAM OXALATE 10 MG PO TABS
10.0000 mg | ORAL_TABLET | Freq: Every day | ORAL | 6 refills | Status: DC
  Filled 2022-06-04 – 2022-06-24 (×2): qty 30, 30d supply, fill #0
  Filled 2022-07-27: qty 30, 30d supply, fill #1
  Filled 2022-08-26: qty 30, 30d supply, fill #2
  Filled 2022-09-24: qty 30, 30d supply, fill #3
  Filled 2022-10-28: qty 30, 30d supply, fill #4
  Filled 2022-11-30: qty 30, 30d supply, fill #5

## 2022-06-05 LAB — CBC WITH DIFFERENTIAL/PLATELET
Absolute Monocytes: 450 cells/uL (ref 200–950)
Basophils Absolute: 48 cells/uL (ref 0–200)
Basophils Relative: 0.8 %
Eosinophils Absolute: 72 cells/uL (ref 15–500)
Eosinophils Relative: 1.2 %
HCT: 36.6 % (ref 35.0–45.0)
Hemoglobin: 12.3 g/dL (ref 11.7–15.5)
Lymphs Abs: 2112 cells/uL (ref 850–3900)
MCH: 29.7 pg (ref 27.0–33.0)
MCHC: 33.6 g/dL (ref 32.0–36.0)
MCV: 88.4 fL (ref 80.0–100.0)
MPV: 11.7 fL (ref 7.5–12.5)
Monocytes Relative: 7.5 %
Neutro Abs: 3318 cells/uL (ref 1500–7800)
Neutrophils Relative %: 55.3 %
Platelets: 192 10*3/uL (ref 140–400)
RBC: 4.14 10*6/uL (ref 3.80–5.10)
RDW: 12 % (ref 11.0–15.0)
Total Lymphocyte: 35.2 %
WBC: 6 10*3/uL (ref 3.8–10.8)

## 2022-06-05 LAB — COMPLETE METABOLIC PANEL WITH GFR
AG Ratio: 1.8 (calc) (ref 1.0–2.5)
ALT: 9 U/L (ref 6–29)
AST: 13 U/L (ref 10–35)
Albumin: 4.2 g/dL (ref 3.6–5.1)
Alkaline phosphatase (APISO): 54 U/L (ref 37–153)
BUN: 17 mg/dL (ref 7–25)
CO2: 25 mmol/L (ref 20–32)
Calcium: 9.2 mg/dL (ref 8.6–10.4)
Chloride: 106 mmol/L (ref 98–110)
Creat: 0.6 mg/dL (ref 0.50–1.03)
Globulin: 2.3 g/dL (calc) (ref 1.9–3.7)
Glucose, Bld: 84 mg/dL (ref 65–99)
Potassium: 4.3 mmol/L (ref 3.5–5.3)
Sodium: 138 mmol/L (ref 135–146)
Total Bilirubin: 0.3 mg/dL (ref 0.2–1.2)
Total Protein: 6.5 g/dL (ref 6.1–8.1)
eGFR: 109 mL/min/{1.73_m2} (ref >=60)

## 2022-06-05 LAB — VITAMIN D 25 HYDROXY (VIT D DEFICIENCY, FRACTURES): Vit D, 25-Hydroxy: 25 ng/mL — ABNORMAL LOW (ref 30–100)

## 2022-06-05 LAB — TSH: TSH: <0.01 mIU/L — ABNORMAL LOW

## 2022-06-09 ENCOUNTER — Other Ambulatory Visit (HOSPITAL_COMMUNITY): Payer: Self-pay

## 2022-06-10 ENCOUNTER — Other Ambulatory Visit (HOSPITAL_COMMUNITY): Payer: Self-pay

## 2022-06-10 MED ORDER — THYROID 15 MG PO TABS
15.0000 mg | ORAL_TABLET | Freq: Every day | ORAL | 0 refills | Status: DC
Start: 2022-06-10 — End: 2022-07-14
  Filled 2022-06-10 (×2): qty 30, 30d supply, fill #0

## 2022-06-10 MED ORDER — THYROID 30 MG PO TABS
30.0000 mg | ORAL_TABLET | Freq: Every day | ORAL | 0 refills | Status: DC
  Filled 2022-06-10 (×2): qty 30, 30d supply, fill #0

## 2022-06-11 ENCOUNTER — Other Ambulatory Visit (HOSPITAL_COMMUNITY): Payer: Self-pay

## 2022-06-24 ENCOUNTER — Other Ambulatory Visit (HOSPITAL_COMMUNITY): Payer: Self-pay

## 2022-07-13 ENCOUNTER — Other Ambulatory Visit (HOSPITAL_COMMUNITY): Payer: Self-pay

## 2022-07-14 ENCOUNTER — Other Ambulatory Visit (HOSPITAL_COMMUNITY): Payer: Self-pay

## 2022-07-14 MED ORDER — THYROID 30 MG PO TABS
30.0000 mg | ORAL_TABLET | Freq: Every day | ORAL | 6 refills | Status: DC
Start: 2022-07-13 — End: 2022-10-19
  Filled 2022-07-14: qty 30, 30d supply, fill #0
  Filled 2022-08-13: qty 30, 30d supply, fill #1
  Filled 2022-09-10: qty 30, 30d supply, fill #2
  Filled 2022-10-08: qty 30, 30d supply, fill #3

## 2022-07-14 MED ORDER — THYROID 15 MG PO TABS
15.0000 mg | ORAL_TABLET | Freq: Every day | ORAL | 6 refills | Status: DC
Start: 2022-07-13 — End: 2022-10-19
  Filled 2022-07-14: qty 30, 30d supply, fill #0
  Filled 2022-08-13: qty 30, 30d supply, fill #1
  Filled 2022-09-10: qty 30, 30d supply, fill #2
  Filled 2022-10-08: qty 30, 30d supply, fill #3

## 2022-07-21 DIAGNOSIS — E039 Hypothyroidism, unspecified: Secondary | ICD-10-CM | POA: Diagnosis not present

## 2022-07-27 ENCOUNTER — Other Ambulatory Visit (HOSPITAL_COMMUNITY): Payer: Self-pay

## 2022-07-28 ENCOUNTER — Other Ambulatory Visit (HOSPITAL_COMMUNITY): Payer: Self-pay

## 2022-07-28 DIAGNOSIS — N926 Irregular menstruation, unspecified: Secondary | ICD-10-CM | POA: Diagnosis not present

## 2022-07-28 DIAGNOSIS — E039 Hypothyroidism, unspecified: Secondary | ICD-10-CM | POA: Diagnosis not present

## 2022-07-28 DIAGNOSIS — E8881 Metabolic syndrome: Secondary | ICD-10-CM | POA: Diagnosis not present

## 2022-07-28 MED ORDER — THYROID 15 MG PO TABS
15.0000 mg | ORAL_TABLET | Freq: Every day | ORAL | 5 refills | Status: DC
Start: 2022-07-28 — End: 2023-08-09
  Filled 2022-07-28 – 2022-11-10 (×2): qty 90, 90d supply, fill #0
  Filled 2023-02-08: qty 90, 90d supply, fill #1
  Filled 2023-05-10: qty 90, 90d supply, fill #2

## 2022-07-28 MED ORDER — THYROID 30 MG PO TABS
30.0000 mg | ORAL_TABLET | Freq: Every day | ORAL | 4 refills | Status: DC
  Filled 2022-07-28 – 2022-11-10 (×2): qty 90, 90d supply, fill #0
  Filled 2023-02-08: qty 90, 90d supply, fill #1
  Filled 2023-05-10: qty 90, 90d supply, fill #2

## 2022-08-13 ENCOUNTER — Other Ambulatory Visit (HOSPITAL_COMMUNITY): Payer: Self-pay

## 2022-08-26 ENCOUNTER — Other Ambulatory Visit (HOSPITAL_COMMUNITY): Payer: Self-pay

## 2022-09-10 ENCOUNTER — Other Ambulatory Visit (HOSPITAL_COMMUNITY): Payer: Self-pay

## 2022-09-25 DIAGNOSIS — J029 Acute pharyngitis, unspecified: Secondary | ICD-10-CM | POA: Diagnosis not present

## 2022-09-25 DIAGNOSIS — J209 Acute bronchitis, unspecified: Secondary | ICD-10-CM | POA: Diagnosis not present

## 2022-10-19 ENCOUNTER — Encounter: Payer: Self-pay | Admitting: Nurse Practitioner

## 2022-10-19 ENCOUNTER — Ambulatory Visit (INDEPENDENT_AMBULATORY_CARE_PROVIDER_SITE_OTHER): Payer: Commercial Managed Care - PPO | Admitting: Nurse Practitioner

## 2022-10-19 ENCOUNTER — Other Ambulatory Visit (HOSPITAL_COMMUNITY): Payer: Self-pay

## 2022-10-19 VITALS — BP 94/60 | HR 79 | Temp 98.4°F | Ht 63.5 in | Wt 131.4 lb

## 2022-10-19 DIAGNOSIS — R300 Dysuria: Secondary | ICD-10-CM

## 2022-10-19 DIAGNOSIS — E039 Hypothyroidism, unspecified: Secondary | ICD-10-CM | POA: Diagnosis not present

## 2022-10-19 MED ORDER — NITROFURANTOIN MONOHYD MACRO 100 MG PO CAPS
100.0000 mg | ORAL_CAPSULE | Freq: Two times a day (BID) | ORAL | 0 refills | Status: AC
  Filled 2022-10-19: qty 14, 7d supply, fill #0

## 2022-10-19 MED ORDER — PHENAZOPYRIDINE HCL 200 MG PO TABS
200.0000 mg | ORAL_TABLET | Freq: Three times a day (TID) | ORAL | 0 refills | Status: DC | PRN
  Filled 2022-10-19: qty 20, 7d supply, fill #0

## 2022-10-19 NOTE — Progress Notes (Signed)
Assessment and Plan:  Carmen Hoffman was seen today for acute visit.  Diagnoses and all orders for this visit:  Hypothyroidism, unspecified type Continue medication and follow with Dr Carmen Hoffman  Dysuria Push fluids If develop back pain, abdominal pain, nausea, vomiting or fever go to the ER -     nitrofurantoin, macrocrystal-monohydrate, (MACROBID) 100 MG capsule; Take 1 capsule (100 mg total) by mouth 2 (two) times daily for 7 days. -     phenazopyridine (PYRIDIUM) 200 MG tablet; Take 1 tablet (200 mg total) by mouth 3 (three) times daily as needed for pain. -     Urine Culture -     Urinalysis, Routine w reflex microscopic       Further disposition pending results of labs. Discussed med's effects and SE's.   Over 30 minutes of exam, counseling, chart review, and critical decision making was performed.   Future Appointments  Date Time Provider [place]  02/26/2023  9:00 AM Carmen Rossetti, NP [facility] None    ------------------------------------------------------------------------------------------------------------------   HPI BP 94/60   Pulse 79   Temp 98.4 F (36.9 C)   Ht 5' 3.5" (1.613 m)   Wt 131 lb 6.4 oz (59.6 kg)   SpO2 97%   BMI 22.91 kg/m   52 y.o.female presents for complaints of blood in urine , dysuria, lower abdominal pain with symptoms present for approximately 3 weeks.  She used Monistat which relieved the vaginal itching she was experiencing but urinary symptoms persist. She has been dealing with her father having motorcycle accident on christmas eve, decided to remove life support later today   BMI is Body mass index is 22.91 kg/m., she has been working on diet and exercise. Wt Readings from Last 3 Encounters:  10/19/22 131 lb 6.4 oz (59.6 kg)  06/04/22 130 lb 3.2 oz (59.1 kg)  02/25/22 135 lb 12.8 oz (61.6 kg)    Her thyroid is controlled through Dr. Chalmers Hoffman , endocrinology Lab Results  Component Value Date   TSH <0.01 (L) 06/04/2022      Past Medical History:  Diagnosis Date   ANEMIA-IRON DEFICIENCY 05/30/2007   Anxiety    Aphthous stomatitis    Arthritis    Cellulitis and abscess of buttock 07/12/2013   10/14 L    Depression    Genital herpes    GERD (gastroesophageal reflux disease)    History of hemorrhoids 06/02/2018   Migraines    Occipital neuralgia 03/27/2013   6/14 R 8/15 Left    Throat pain 07/26/2008   2015 s/p ENT eval     Von Willebrand disease ([place]) 1992     Allergies  Allergen Reactions   Diflucan [Fluconazole]     Rash Lip swelling    Current Outpatient Medications on File Prior to Visit  Medication Sig   escitalopram (LEXAPRO) 10 MG tablet Take 1 tablet (10 mg total) by mouth daily.   thyroid (NP THYROID) 15 MG tablet Take 1 tablet (15 mg total) by mouth daily on an empty stomach   thyroid (NP THYROID) 30 MG tablet Take 1 tablet (30 mg total) by mouth daily on an empty stomach   traMADol (ULTRAM) 50 MG tablet TAKE 2 TABLETS (100 MG TOTAL) BY MOUTH EVERY 6 (SIX) HOURS AS NEEDED FOR UP TO 5 DAYS FOR MODERATE PAIN OR SEVERE PAIN.   valACYclovir (VALTREX) 500 MG tablet TAKE 1 TABLET BY MOUTH ONCE DAILY FOR SUPPRESSION. TAKE TWICE DAILY FOR 3 DAYS FOR OUTBREAK   thyroid (NP THYROID) 15  MG tablet Take 1 tablet (15 mg total) by mouth daily on an empty stomach. (Patient not taking: Reported on 10/19/2022)   thyroid (NP THYROID) 30 MG tablet Take 1 tablet (30 mg total) by mouth daily on an empty stomach. (Patient not taking: Reported on 10/19/2022)   No current facility-administered medications on file prior to visit.    ROS: all negative except above.   Physical Exam:  BP 94/60   Pulse 79   Temp 98.4 F (36.9 C)   Ht 5' 3.5" (1.613 m)   Wt 131 lb 6.4 oz (59.6 kg)   SpO2 97%   BMI 22.91 kg/m   General Appearance: Well nourished, in no apparent distress. Eyes: PERRLA, EOMs, conjunctiva no swelling or erythema Sinuses: No Frontal/maxillary tenderness ENT/Mouth: Ext aud canals clear, TMs  without erythema, bulging. No erythema, swelling, or exudate on post pharynx.  Tonsils not swollen or erythematous. Hearing normal.  Neck: Supple, thyroid normal.  Respiratory: Respiratory effort normal, BS equal bilaterally without rales, rhonchi, wheezing or stridor.  Cardio: RRR with no MRGs. Brisk peripheral pulses without edema.  Abdomen: Soft, + BS.  Mild suprapubic tenderness Lymphatics: Non tender without lymphadenopathy.  Musculoskeletal: Full ROM, 5/5 strength, normal gait. Negative CVA tenderness Skin: Warm, dry without rashes, lesions, ecchymosis.  Neuro: Cranial nerves intact. Normal muscle tone, no cerebellar symptoms. Sensation intact.  Psych: Awake and oriented X 3, normal affect, Insight and Judgment appropriate.     Carmen Rossetti, NP 1:59 PM [facility]

## 2022-10-19 NOTE — Patient Instructions (Signed)
Urinary Tract Infection, Adult  A urinary tract infection (UTI) is an infection of any part of the urinary tract. The urinary tract includes the kidneys, ureters, bladder, and urethra. These organs make, store, and get rid of urine in the body. An upper UTI affects the ureters and kidneys. A lower UTI affects the bladder and urethra. What are the causes? Most urinary tract infections are caused by bacteria in your genital area around your urethra, where urine leaves your body. These bacteria grow and cause inflammation of your urinary tract. What increases the risk? You are more likely to develop this condition if: You have a urinary catheter that stays in place. You are not able to control when you urinate or have a bowel movement (incontinence). You are female and you: Use a spermicide or diaphragm for birth control. Have low estrogen levels. Are pregnant. You have certain genes that increase your risk. You are sexually active. You take antibiotic medicines. You have a condition that causes your flow of urine to slow down, such as: An enlarged prostate, if you are female. Blockage in your urethra. A kidney stone. A nerve condition that affects your bladder control (neurogenic bladder). Not getting enough to drink, or not urinating often. You have certain medical conditions, such as: Diabetes. A weak disease-fighting system (immunesystem). Sickle cell disease. Gout. Spinal cord injury. What are the signs or symptoms? Symptoms of this condition include: Needing to urinate right away (urgency). Frequent urination. This may include small amounts of urine each time you urinate. Pain or burning with urination. Blood in the urine. Urine that smells bad or unusual. Trouble urinating. Cloudy urine. Vaginal discharge, if you are female. Pain in the abdomen or the lower back. You may also have: Vomiting or a decreased appetite. Confusion. Irritability or tiredness. A fever or  chills. Diarrhea. The first symptom in older adults may be confusion. In some cases, they may not have any symptoms until the infection has worsened. How is this diagnosed? This condition is diagnosed based on your medical history and a physical exam. You may also have other tests, including: Urine tests. Blood tests. Tests for STIs (sexually transmitted infections). If you have had more than one UTI, a cystoscopy or imaging studies may be done to determine the cause of the infections. How is this treated? Treatment for this condition includes: Antibiotic medicine. Over-the-counter medicines to treat discomfort. Drinking enough water to stay hydrated. If you have frequent infections or have other conditions such as a kidney stone, you may need to see a health care provider who specializes in the urinary tract (urologist). In rare cases, urinary tract infections can cause sepsis. Sepsis is a life-threatening condition that occurs when the body responds to an infection. Sepsis is treated in the hospital with IV antibiotics, fluids, and other medicines. Follow these instructions at home:  Medicines Take over-the-counter and prescription medicines only as told by your health care provider. If you were prescribed an antibiotic medicine, take it as told by your health care provider. Do not stop using the antibiotic even if you start to feel better. General instructions Make sure you: Empty your bladder often and completely. Do not hold urine for long periods of time. Empty your bladder after sex. Wipe from front to back after urinating or having a bowel movement if you are female. Use each tissue only one time when you wipe. Drink enough fluid to keep your urine pale yellow. Keep all follow-up visits. This is important. Contact a health   care provider if: Your symptoms do not get better after 1-2 days. Your symptoms go away and then return. Get help right away if: You have severe pain in  your back or your lower abdomen. You have a fever or chills. You have nausea or vomiting. Summary A urinary tract infection (UTI) is an infection of any part of the urinary tract, which includes the kidneys, ureters, bladder, and urethra. Most urinary tract infections are caused by bacteria in your genital area. Treatment for this condition often includes antibiotic medicines. If you were prescribed an antibiotic medicine, take it as told by your health care provider. Do not stop using the antibiotic even if you start to feel better. Keep all follow-up visits. This is important. This information is not intended to replace advice given to you by your health care provider. Make sure you discuss any questions you have with your health care provider. Document Revised: 05/10/2020 Document Reviewed: 05/10/2020 Elsevier Patient Education  2023 [employer]  

## 2022-10-21 LAB — URINALYSIS, ROUTINE W REFLEX MICROSCOPIC
Bilirubin Urine: NEGATIVE
Glucose, UA: NEGATIVE
Hgb urine dipstick: NEGATIVE
Hyaline Cast: NONE SEEN /LPF
Nitrite: NEGATIVE
Specific Gravity, Urine: 1.022 (ref 1.001–1.035)
pH: 6 (ref 5.0–8.0)

## 2022-10-21 LAB — MICROSCOPIC MESSAGE

## 2022-10-21 LAB — URINE CULTURE
MICRO NUMBER:: 14402677
SPECIMEN QUALITY:: ADEQUATE

## 2022-11-10 ENCOUNTER — Other Ambulatory Visit (HOSPITAL_COMMUNITY): Payer: Self-pay

## 2022-11-11 ENCOUNTER — Other Ambulatory Visit: Payer: Self-pay

## 2022-11-13 ENCOUNTER — Other Ambulatory Visit (HOSPITAL_COMMUNITY): Payer: Self-pay

## 2022-11-13 DIAGNOSIS — R32 Unspecified urinary incontinence: Secondary | ICD-10-CM | POA: Diagnosis not present

## 2022-11-13 DIAGNOSIS — Z130 Encounter for screening for diseases of the blood and blood-forming organs and certain disorders involving the immune mechanism: Secondary | ICD-10-CM | POA: Diagnosis not present

## 2022-11-13 DIAGNOSIS — Z01419 Encounter for gynecological examination (general) (routine) without abnormal findings: Secondary | ICD-10-CM | POA: Diagnosis not present

## 2022-11-13 DIAGNOSIS — A609 Anogenital herpesviral infection, unspecified: Secondary | ICD-10-CM | POA: Diagnosis not present

## 2022-11-13 DIAGNOSIS — Z1389 Encounter for screening for other disorder: Secondary | ICD-10-CM | POA: Diagnosis not present

## 2022-11-13 MED ORDER — TOLTERODINE TARTRATE ER 4 MG PO CP24
4.0000 mg | ORAL_CAPSULE | Freq: Every day | ORAL | 0 refills | Status: DC
  Filled 2022-11-13: qty 30, 30d supply, fill #0

## 2022-11-13 MED ORDER — VALACYCLOVIR HCL 500 MG PO TABS
500.0000 mg | ORAL_TABLET | Freq: Every day | ORAL | 2 refills | Status: DC
  Filled 2022-11-13: qty 90, 72d supply, fill #0

## 2022-11-19 ENCOUNTER — Encounter: Payer: Self-pay | Admitting: Internal Medicine

## 2022-11-27 DIAGNOSIS — Z20822 Contact with and (suspected) exposure to covid-19: Secondary | ICD-10-CM | POA: Diagnosis not present

## 2022-11-27 DIAGNOSIS — J029 Acute pharyngitis, unspecified: Secondary | ICD-10-CM | POA: Diagnosis not present

## 2022-12-25 ENCOUNTER — Other Ambulatory Visit (HOSPITAL_COMMUNITY): Payer: Self-pay

## 2022-12-29 ENCOUNTER — Encounter: Payer: Self-pay | Admitting: Nurse Practitioner

## 2022-12-29 ENCOUNTER — Other Ambulatory Visit (HOSPITAL_COMMUNITY): Payer: Self-pay

## 2022-12-29 ENCOUNTER — Ambulatory Visit: Payer: Commercial Managed Care - PPO | Admitting: Nurse Practitioner

## 2022-12-29 VITALS — BP 98/62 | HR 81 | Temp 97.7°F | Ht 63.5 in | Wt 137.8 lb

## 2022-12-29 DIAGNOSIS — E039 Hypothyroidism, unspecified: Secondary | ICD-10-CM | POA: Diagnosis not present

## 2022-12-29 DIAGNOSIS — M62838 Other muscle spasm: Secondary | ICD-10-CM

## 2022-12-29 DIAGNOSIS — M4302 Spondylolysis, cervical region: Secondary | ICD-10-CM

## 2022-12-29 MED ORDER — PREDNISONE 20 MG PO TABS
ORAL_TABLET | ORAL | 0 refills | Status: AC
  Filled 2022-12-29: qty 20, 11d supply, fill #0

## 2022-12-29 MED ORDER — BACLOFEN 10 MG PO TABS
5.0000 mg | ORAL_TABLET | Freq: Two times a day (BID) | ORAL | 1 refills | Status: DC
  Filled 2022-12-29: qty 60, 30d supply, fill #0

## 2022-12-29 NOTE — Progress Notes (Signed)
Assessment and Plan:  Carmen Hoffman was seen today for neck pain.  Diagnoses and all orders for this visit:  Trapezius muscle spasm Discussed care, apply ice to the area 2-3 times 33min at a time this evening. -     predniSONE (DELTASONE) 20 MG tablet; Take 3 tablets daily with food for 3 days, 2 tablets daily for 3 days, 1 tablet a day for 5 days. -     baclofen (LIORESAL) 10 MG tablet; Take 1/2-1 tablets (5-10 mg total) by mouth 2 (two) times daily  if needed for muscle spasm -     PR INJECTION SINGLE/MLT TRIGGER POINT 1/2 MUSCLES  Hypothyroidism, unspecified type Continue meds Continue to follow with Dr. Chalmers Cater  Cervical spondylolysis Continue to follow with orthopedics as needed  Monitor symptoms    Further disposition pending results of labs. Discussed med's effects and SE's.   Over 30 minutes of face to face interview, exam, counseling, chart review, and critical decision making was performed.   Future Appointments  Date Time Provider [place]  12/29/2022  4:00 PM Alycia Rossetti, NP [facility] None  02/26/2023  9:00 AM Alycia Rossetti, NP [facility] None    ------------------------------------------------------------------------------------------------------------------   HPI 52 y.o.female presents for complaints on pain in her neck on right side that radiates up into head. Pain began Saturday after pulling weeds in garden and weed eating. Tramadol does help, also using goody powders, Tylenol  She does have cervical spondylosis for which she uses Ultram as needed.  Does occasionally get exacerbated from work.    She is currently on NP thyroid 15 mg qd and 30 mg QD, this was decreased after last labwork.  Last TSH was : Lab Results  Component Value Date   TSH <0.01 (L) 06/04/2022     Past Medical History:  Diagnosis Date   ANEMIA-IRON DEFICIENCY 05/30/2007   Anxiety    Aphthous stomatitis    Arthritis    Cellulitis and abscess of buttock 07/12/2013   10/14 L     Depression    Genital herpes    GERD (gastroesophageal reflux disease)    History of hemorrhoids 06/02/2018   Migraines    Occipital neuralgia 03/27/2013   6/14 R 8/15 Left    Throat pain 07/26/2008   2015 s/p ENT eval     Von Willebrand disease (Home) 1992     Allergies  Allergen Reactions   Diflucan [Fluconazole]     Rash Lip swelling    Current Outpatient Medications on File Prior to Visit  Medication Sig   escitalopram (LEXAPRO) 10 MG tablet Take 1 tablet (10 mg total) by mouth daily.   phenazopyridine (PYRIDIUM) 200 MG tablet Take 1 tablet (200 mg total) by mouth 3 (three) times daily as needed for urinary pain.   thyroid (NP THYROID) 15 MG tablet Take 1 tablet (15 mg total) by mouth daily on an empty stomach   thyroid (NP THYROID) 30 MG tablet Take 1 tablet (30 mg total) by mouth daily on an empty stomach   tolterodine (DETROL LA) 4 MG 24 hr capsule Take 1 capsule (4 mg total) by mouth daily.   traMADol (ULTRAM) 50 MG tablet TAKE 2 TABLETS (100 MG TOTAL) BY MOUTH EVERY 6 (SIX) HOURS AS NEEDED FOR UP TO 5 DAYS FOR MODERATE PAIN OR SEVERE PAIN.   valACYclovir (VALTREX) 500 MG tablet TAKE 1 TABLET BY MOUTH ONCE DAILY FOR SUPPRESSION. TAKE TWICE DAILY FOR 3 DAYS FOR OUTBREAK   valACYclovir (VALTREX) 500 MG  tablet Take 1 tablet (500 mg total) by mouth daily FOR SUPPRESSION. TAKE TWICE DAILY FOR 3 DAYS FOR OUTBREAK.   No current facility-administered medications on file prior to visit.    ROS: all negative except above.   Physical Exam:  There were no vitals taken for this visit.  General Appearance: Well nourished, in no apparent distress. arynx.  Tonsils not swollen or erythematous. Hearing normal.  Neck: Supple, thyroid normal.  Respiratory: Respiratory effort normal, BS equal bilaterally without rales, rhonchi, wheezing or stridor.  Cardio: RRR with no MRGs. Brisk peripheral pulses without edema.  Lymphatics: Non tender without lymphadenopathy.  Musculoskeletal: Full  ROM, 5/5 strength, normal gait. Right trapezius, tender, asymmetrical. Skin: Warm, dry without rashes, lesions, ecchymosis.  Neuro: Cranial nerves intact. Normal muscle tone, no cerebellar symptoms. Sensation intact.  Psych: Awake and oriented X 3, normal affect, Insight and Judgment appropriate.    Verbal consent obtained, risk and benefits were addressed with patient. A trigger point injection was performed at the site of maximal tenderness,trap/ levator scapulae right,  using 1% plain Lidocaine and Dexamethasone. This was well tolerated, and followed by immediate relief of pain. Return precautions discussed with the patient. After care discussed.       [facility]  12/29/2022

## 2022-12-29 NOTE — Patient Instructions (Signed)
Muscle Cramps and Spasms Muscle cramps and spasms happen when muscles tighten on their own. They can be in any muscle. They happen most often in the muscles in the back of your lower leg (calf). Muscle cramps are painful. They are often stronger and last longer than muscle spasms. Muscle spasms may or may not be painful. In many cases, the cause of a muscle cramp or spasm is not known. But it may be from: Doing more work or exercise than your body is ready for. Using the muscles too much (overuse). Staying in one position for too long. Not preparing enough or having bad form when you play a sport or do an activity. Getting hurt. Other causes may include: Not enough water in your body (dehydration). Taking certain medicines. Not having enough salts and minerals in the body (electrolytes). Follow these instructions at home: Eating and drinking Drink enough fluid to keep your pee (urine) pale yellow. Eat a healthy diet to help your muscles work well. Your diet should include: Fruits and vegetables. Lean protein. Whole grains. Low-fat or nonfat dairy products. Managing pain and stiffness     Massage, stretch, and relax the muscle. Do this for a few minutes at a time. If told, put ice on the muscle. This may help if you are sore or have pain after a cramp or spasm. Put ice in a plastic bag. Place a towel between your skin and the bag. Leave the ice on for 20 minutes, 2-3 times a day. If told, put heat on tight or tense muscles. Do this as often as told by your doctor. Use the heat source that your doctor recommends, such as a moist heat pack or a heating pad. Place a towel between your skin and the heat source. Leave the heat on for 20-30 minutes. If your skin turns bright red, take off the ice or heat right away to prevent skin damage. The risk of damage is higher if you cannot feel pain, heat, or cold. Take hot showers or baths to help relax the muscles. General instructions If you  are having cramps often, avoid intense exercise for a few days. Take over-the-counter and prescription medicines only as told by your doctor. Watch for any changes in your symptoms. Contact a doctor if: Your cramps or spasms get worse or happen more often. Your cramps or spasms do not get better with time. This information is not intended to replace advice given to you by your health care provider. Make sure you discuss any questions you have with your health care provider. Document Revised: 05/19/2022 Document Reviewed: 05/19/2022 Elsevier Patient Education  2023 Elsevier Inc.  

## 2023-02-10 NOTE — Progress Notes (Deleted)
Assessment and Plan:  There are no diagnoses linked to this encounter.    Further disposition pending results of labs. Discussed med's effects and SE's.   Over 30 minutes of exam, counseling, chart review, and critical decision making was performed.   Future Appointments  Date Time Provider Department Center  02/11/2023  3:30 PM Raynelle Dick, NP [facility] None  02/26/2023  9:00 AM Raynelle Dick, NP [facility] None    ------------------------------------------------------------------------------------------------------------------   HPI There were no vitals taken for this visit. 52 y.o.female presents for  Past Medical History:  Diagnosis Date   ANEMIA-IRON DEFICIENCY 05/30/2007   Anxiety    Aphthous stomatitis    Arthritis    Cellulitis and abscess of buttock 07/12/2013   10/14 L    Depression    Genital herpes    GERD (gastroesophageal reflux disease)    History of hemorrhoids 06/02/2018   Migraines    Occipital neuralgia 03/27/2013   6/14 R 8/15 Left    Throat pain 07/26/2008   2015 s/p ENT eval     Von Willebrand disease (HCC) 1992     Allergies  Allergen Reactions   Diflucan [Fluconazole]     Rash Lip swelling    Current Outpatient Medications on File Prior to Visit  Medication Sig   baclofen (LIORESAL) 10 MG tablet Take 1/2-1 tablets (5-10 mg total) by mouth 2 (two) times daily  if needed for muscle spasm   thyroid (NP THYROID) 15 MG tablet Take 1 tablet (15 mg total) by mouth daily on an empty stomach   thyroid (NP THYROID) 30 MG tablet Take 1 tablet (30 mg total) by mouth daily on an empty stomach   traMADol (ULTRAM) 50 MG tablet TAKE 2 TABLETS (100 MG TOTAL) BY MOUTH EVERY 6 (SIX) HOURS AS NEEDED FOR UP TO 5 DAYS FOR MODERATE PAIN OR SEVERE PAIN.   valACYclovir (VALTREX) 500 MG tablet Take 1 tablet (500 mg total) by mouth daily FOR SUPPRESSION. TAKE TWICE DAILY FOR 3 DAYS FOR OUTBREAK.   No current facility-administered medications on file prior to  visit.    ROS: all negative except above.   Physical Exam:  There were no vitals taken for this visit.  General Appearance: Well nourished, in no apparent distress. Eyes: PERRLA, EOMs, conjunctiva no swelling or erythema Sinuses: No Frontal/maxillary tenderness ENT/Mouth: Ext aud canals clear, TMs without erythema, bulging. No erythema, swelling, or exudate on post pharynx.  Tonsils not swollen or erythematous. Hearing normal.  Neck: Supple, thyroid normal.  Respiratory: Respiratory effort normal, BS equal bilaterally without rales, rhonchi, wheezing or stridor.  Cardio: RRR with no MRGs. Brisk peripheral pulses without edema.  Abdomen: Soft, + BS.  Non tender, no guarding, rebound, hernias, masses. Lymphatics: Non tender without lymphadenopathy.  Musculoskeletal: Full ROM, 5/5 strength, normal gait.  Skin: Warm, dry without rashes, lesions, ecchymosis.  Neuro: Cranial nerves intact. Normal muscle tone, no cerebellar symptoms. Sensation intact.  Psych: Awake and oriented X 3, normal affect, Insight and Judgment appropriate.     Raynelle Dick, NP 9:45 AM [facility]

## 2023-02-11 ENCOUNTER — Ambulatory Visit: Payer: Commercial Managed Care - PPO | Admitting: Nurse Practitioner

## 2023-02-13 ENCOUNTER — Encounter (HOSPITAL_COMMUNITY): Payer: Self-pay

## 2023-02-13 ENCOUNTER — Ambulatory Visit (HOSPITAL_COMMUNITY)
Admission: EM | Admit: 2023-02-13 | Discharge: 2023-02-13 | Disposition: A | Discharge status: Home / Self Care | Payer: Commercial Managed Care - PPO

## 2023-02-13 DIAGNOSIS — H5509 Other forms of nystagmus: Secondary | ICD-10-CM | POA: Diagnosis not present

## 2023-02-13 DIAGNOSIS — H65192 Other acute nonsuppurative otitis media, left ear: Secondary | ICD-10-CM

## 2023-02-13 DIAGNOSIS — R42 Dizziness and giddiness: Secondary | ICD-10-CM

## 2023-02-13 MED ORDER — AMOXICILLIN 500 MG PO CAPS: 500.0000 mg | ORAL_CAPSULE | Freq: Two times a day (BID) | ORAL | 0 refills | Status: AC

## 2023-02-13 MED ORDER — MECLIZINE HCL 12.5 MG PO TABS: 12.5000 mg | ORAL_TABLET | Freq: Three times a day (TID) | ORAL | 0 refills | Status: DC | PRN

## 2023-02-13 NOTE — ED Triage Notes (Signed)
Patient here today with c/o fluid in her left ear, dizziness, and nausea Since Wednesday. She was given Cipro ear drops and Zofran. Patient stated that she felt better on Thursday but since then the nausea and dizziness have gotten worse.

## 2023-02-13 NOTE — Discharge Instructions (Addendum)
For the left ear infection, take the amoxicillin (antibiotic) twice daily for the next 5 days.  I recommend you take the Zofran (nausea medicine) about 30 minutes before taking antibiotic.  Make sure you eat something as well, since the antibiotic can upset your stomach.  The Antivert can be used 3 times daily as needed for dizziness.  If you are consistently using this for the next 3 days without any relief I recommend you be evaluated in the emergency department.  Also please be evaluated if at any point your symptoms worsen.

## 2023-02-13 NOTE — ED Provider Notes (Signed)
[facility]    CSN: [id number] Arrival date & time: 02/13/23  1740      History   Chief Complaint Chief Complaint  Patient presents with   Nausea    Left ear fluid, dizziness    HPI Carmen Hoffman is a 52 y.o. female.  Dizziness and nausea started 3 days ago, on Wednesday Went to an urgent care who put her on ear drops and zofran Was doing well until this morning. Woke up and dizziness is worse. Feels like her head is spinning with slight motion. Left ear bothering her. No drainage or hearing changes No history of this  No head injury or trauma  No fever, abdominal pain, vomiting  Past Medical History:  Diagnosis Date   ANEMIA-IRON DEFICIENCY 05/30/2007   Anxiety    Aphthous stomatitis    Arthritis    Cellulitis and abscess of buttock 07/12/2013   10/14 L    Depression    Genital herpes    GERD (gastroesophageal reflux disease)    History of hemorrhoids 06/02/2018   Migraines    Occipital neuralgia 03/27/2013   6/14 R 8/15 Left    Throat pain 07/26/2008   2015 s/p ENT eval     Von Willebrand disease (HCC) 1992    Patient Active Problem List   Diagnosis Date Noted   Cervical spondylolysis 02/20/2021   ANA positive 08/15/2019   HLA B27 (HLA B27 positive) 08/15/2019   Hypothyroidism 08/15/2019   Constipation 06/02/2018   COPD (chronic obstructive pulmonary disease) (HCC) 06/18/2016   Atherosclerosis of aorta (HCC) 06/18/2016   Tobacco abuse disorder 06/06/2014   Headache, tension-type 05/17/2013   Neck pain on right side 03/27/2013   B12 deficiency 10/17/2012   Vitamin D deficiency 10/17/2012   GERD (gastroesophageal reflux disease)    Genital herpes    Depression, major, recurrent, 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      Home Medications    Prior to Admission medications   Medication Sig Start Date End Date Taking? Authorizing Provider  amoxicillin (AMOXIL) 500 MG capsule Take 1 capsule (500 mg total) by mouth 2 (two) times daily for 5 days. 02/13/23 02/18/23 Yes Lesleyann Fichter, Lurena Joiner, PA-C  meclizine (ANTIVERT) 12.5 MG tablet Take 1 tablet (12.5 mg total) by mouth 3 (three) times daily as needed for dizziness. 02/13/23  Yes Sunni Richardson, PA-C  ondansetron (ZOFRAN-ODT) 4 MG disintegrating tablet Take 4 mg by mouth every 8 (eight) hours as needed for nausea or vomiting.   Yes [provider]  thyroid (NP THYROID) 15 MG tablet Take 1 tablet (15 mg total) by mouth daily on an empty stomach 07/28/22  Yes   thyroid (NP THYROID) 30 MG tablet Take 1 tablet (30 mg total) by mouth daily on an empty stomach 07/28/22  Yes   baclofen (LIORESAL) 10 MG tablet Take 1/2-1 tablets (5-10 mg total) by mouth 2 (two) times daily  if needed for muscle spasm 12/29/22   Raynelle Dick, NP  traMADol (ULTRAM) 50 MG tablet TAKE 2 TABLETS (100 MG TOTAL) BY MOUTH EVERY 6 (SIX) HOURS AS NEEDED FOR UP TO 5 DAYS FOR MODERATE PAIN OR SEVERE PAIN.  04/23/21   Raynelle Dick, NP  valACYclovir (VALTREX) 500 MG tablet Take 1 tablet (500 mg total) by mouth daily FOR SUPPRESSION. TAKE TWICE DAILY FOR 3 DAYS FOR OUTBREAK. 11/13/22       Family History Family History  Problem Relation Age of Onset   Fibromyalgia Mother    Hypertension Mother    Pancreatic cancer Mother 57   Hypertension Father    Bladder Cancer Father        34s, smoker   COPD Father    Arthritis Brother        rheumatoid   Healthy Daughter    Healthy Son    Cancer Paternal Uncle        ? Leukemia   Heart disease Maternal Grandfather    Breast cancer Cousin    Colon cancer Paternal Aunt    Thyroid disease Brother        Hyperthyroidism   Colon polyps Neg Hx    Rectal cancer Neg Hx    Stomach cancer Neg Hx     Esophageal cancer Neg Hx     Social History Social History   Tobacco Use   Smoking status: Every Day    Packs/day: 0.50    Years: 33.00    Additional pack years: 0.00    Total pack years: 16.50    Types: Cigarettes    Start date: 72    Last attempt to quit: 05/24/2016    Years since quitting: 6.7   Smokeless tobacco: Never   Tobacco comments:    stopped 2017, started back 2021  Vaping Use   Vaping Use: Never used  Substance Use Topics   Alcohol use: No   Drug use: No     Allergies   Diflucan [fluconazole]   Review of Systems Review of Systems As per HPI  Physical Exam Triage Vital Signs ED Triage Vitals  Enc Vitals Group     BP 02/13/23 1811 97/60     Pulse Rate 02/13/23 1811 60     Resp 02/13/23 1811 16     Temp 02/13/23 1811 97.9 F (36.6 C)     Temp Source 02/13/23 1811 Oral     SpO2 02/13/23 1811 98 %     Weight 02/13/23 1810 138 lb (62.6 kg)     Height 02/13/23 1810 5\' 2"  (1.575 m)     Head Circumference --      Peak Flow --      Pain Score 02/13/23 1808 0     Pain Loc --      Pain Edu? --      Excl. in GC? --    No data found.  Updated Vital Signs BP 97/60 (BP Location: Left Arm)   Pulse 60   Temp 97.9 F (36.6 C) (Oral)   Resp 16   Ht 5\' 2"  (1.575 m)   Wt 138 lb (62.6 kg)   LMP 01/09/2023 (Approximate)   SpO2 98%   BMI 25.24 kg/m   Physical Exam Vitals and nursing note reviewed.  Constitutional:      General: She is not in acute distress.    Appearance: She is not ill-appearing.  HENT:     Head: Atraumatic.     Right Ear: Tympanic membrane and ear canal normal.     Left Ear: A middle ear effusion is present.     Ears:     Comments: Left TM with cloudy fluid, appears full    Mouth/Throat:  Mouth: Mucous membranes are moist.     Pharynx: Oropharynx is clear.  Eyes:     Extraocular Movements:     Right eye: Nystagmus present.     Left eye: Nystagmus present.     Conjunctiva/sclera: Conjunctivae normal.     Comments:  Horizontal nystagmus bilaterally   Neck:     Comments: Normal ROM of neck. Dizzy with head motion. Cardiovascular:     Rate and Rhythm: Normal rate and regular rhythm.     Heart sounds: Normal heart sounds.  Pulmonary:     Effort: Pulmonary effort is normal.     Breath sounds: Normal breath sounds.  Abdominal:     Palpations: There is no mass.     Tenderness: There is no abdominal tenderness. There is no guarding or rebound.  Musculoskeletal:        General: Normal range of motion.     Cervical back: Normal range of motion. No rigidity.  Skin:    General: Skin is warm and dry.  Neurological:     Mental Status: She is alert and oriented to person, place, and time.     Cranial Nerves: No cranial nerve deficit.     Sensory: Sensation is intact.     Motor: Motor function is intact.     Coordination: Coordination is intact.     Gait: Gait is intact.     Comments: No CN deficit apart from the horizontal nystagmus. EOM intact otherwise     UC Treatments / Results  Labs (all labs ordered are listed, but only abnormal results are displayed) Labs Reviewed - No data to display  EKG   Radiology No results found.  Procedures Procedures (including critical care time)  Medications Ordered in UC Medications - No data to display  Initial Impression / Assessment and Plan / UC Course  I have reviewed the triage vital signs and the nursing notes.  Pertinent labs & imaging results that were available during my care of the patient were reviewed by me and considered in my medical decision making (see chart for details).  Left otitis media Amox BID x 5 days. Discussed using zofran and eating before to reduce abx side effects given she is already nauseous. Can discontinue the drops previously prescribed; no sign of external ear infection.   Vertigo, horizontal nystagmus Neuro exam intact otherwise. Stable vitals Antivert TID prn Discussed strict ED precautions if she is still having  dizziness while using this medicine or if at any point symptoms worsen. Patient verbalizes understanding   Final Clinical Impressions(s) / UC Diagnoses   Final diagnoses:  Other acute nonsuppurative otitis media of left ear, recurrence not specified  Vertigo  Horizontal nystagmus     Discharge Instructions      For the left ear infection, take the amoxicillin (antibiotic) twice daily for the next 5 days.  I recommend you take the Zofran (nausea medicine) about 30 minutes before taking antibiotic.  Make sure you eat something as well, since the antibiotic can upset your stomach.  The Antivert can be used 3 times daily as needed for dizziness.  If you are consistently using this for the next 3 days without any relief I recommend you be evaluated in the emergency department.  Also please be evaluated if at any point your symptoms worsen.    ED Prescriptions     Medication Sig Dispense Auth. Provider   amoxicillin (AMOXIL) 500 MG capsule Take 1 capsule (500 mg total) by mouth 2 (two)  times daily for 5 days. 10 capsule Correy Weidner, Lurena Joiner, PA-C   meclizine (ANTIVERT) 12.5 MG tablet Take 1 tablet (12.5 mg total) by mouth 3 (three) times daily as needed for dizziness. 30 tablet Genelle Economou, Lurena Joiner, PA-C      PDMP not reviewed this encounter.   Griselda Tosh, Lurena Joiner, PA-C 02/14/23 1022

## 2023-02-15 ENCOUNTER — Emergency Department (HOSPITAL_COMMUNITY): Payer: Commercial Managed Care - PPO

## 2023-02-15 ENCOUNTER — Telehealth: Payer: Commercial Managed Care - PPO | Admitting: Physician Assistant

## 2023-02-15 ENCOUNTER — Emergency Department (HOSPITAL_COMMUNITY)
Admission: EM | Admit: 2023-02-15 | Discharge: 2023-02-15 | Disposition: A | Discharge status: Home / Self Care | Payer: Commercial Managed Care - PPO | Attending: Emergency Medicine | Admitting: Emergency Medicine

## 2023-02-15 ENCOUNTER — Encounter (HOSPITAL_COMMUNITY): Payer: Self-pay

## 2023-02-15 ENCOUNTER — Other Ambulatory Visit: Payer: Self-pay

## 2023-02-15 DIAGNOSIS — R29818 Other symptoms and signs involving the nervous system: Secondary | ICD-10-CM | POA: Diagnosis not present

## 2023-02-15 DIAGNOSIS — R42 Dizziness and giddiness: Secondary | ICD-10-CM

## 2023-02-15 LAB — URINALYSIS, ROUTINE W REFLEX MICROSCOPIC
Bilirubin Urine: NEGATIVE
Glucose, UA: NEGATIVE mg/dL
Hgb urine dipstick: NEGATIVE
Ketones, ur: NEGATIVE mg/dL
Leukocytes,Ua: NEGATIVE
Nitrite: NEGATIVE
Protein, ur: NEGATIVE mg/dL
Specific Gravity, Urine: 1.015 (ref 1.005–1.030)
pH: 8 (ref 5.0–8.0)

## 2023-02-15 LAB — BASIC METABOLIC PANEL
Anion gap: 6 (ref 5–15)
BUN: 9 mg/dL (ref 6–20)
CO2: 27 mmol/L (ref 22–32)
Calcium: 8.9 mg/dL (ref 8.9–10.3)
Chloride: 102 mmol/L (ref 98–111)
Creatinine, Ser: 0.64 mg/dL (ref 0.44–1.00)
GFR, Estimated: >60 mL/min (ref >60)
Glucose, Bld: 94 mg/dL (ref 70–99)
Potassium: 3.9 mmol/L (ref 3.5–5.1)
Sodium: 135 mmol/L (ref 135–145)

## 2023-02-15 LAB — CBC
HCT: 40.7 % (ref 36.0–46.0)
Hemoglobin: 13.5 g/dL (ref 12.0–15.0)
MCH: 30.1 pg (ref 26.0–34.0)
MCHC: 33.2 g/dL (ref 30.0–36.0)
MCV: 90.8 fL (ref 80.0–100.0)
Platelets: 190 10*3/uL (ref 150–400)
RBC: 4.48 MIL/uL (ref 3.87–5.11)
RDW: 12.2 % (ref 11.5–15.5)
WBC: 6.1 10*3/uL (ref 4.0–10.5)
nRBC: 0 % (ref 0.0–0.2)

## 2023-02-15 MED ORDER — PROCHLORPERAZINE EDISYLATE 10 MG/2ML IJ SOLN
10.0000 mg | Freq: Once | INTRAMUSCULAR | Status: AC
  Administered 2023-02-15: 10 mg via INTRAVENOUS
  Filled 2023-02-15: qty 2

## 2023-02-15 MED ORDER — DIPHENHYDRAMINE HCL 50 MG/ML IJ SOLN
25.0000 mg | Freq: Once | INTRAMUSCULAR | Status: AC
  Administered 2023-02-15: 25 mg via INTRAVENOUS
  Filled 2023-02-15: qty 1

## 2023-02-15 NOTE — Discharge Instructions (Signed)
Continue the amoxicillin and meclizine as prescribed.  Can increase the meclizine to 25mg  (2 tablets) three times daily from 12.5mg .

## 2023-02-15 NOTE — ED Provider Triage Note (Signed)
Emergency Medicine Provider Triage Evaluation Note  Carmen Hoffman , a 52 y.o. female  was evaluated in triage.  Patient presenting today with 5 days worth of nausea and dizziness.  Seen at urgent care 2 days ago and diagnosed with an ear infection and treated with amoxicillin as well as Antivert for presumed vertigo.  She reports that she has continued to be dizzy.  Yesterday it was tolerable but today it feels like "everything is spinning."  Only past medical history is thyroid disorder, never had a stroke but reports that urgent care said that her eyes were twitching  Review of Systems  Positive:  Negative:   Physical Exam  BP 117/71   Pulse 69   Temp 98.2 F (36.8 C) (Oral)   Resp 14   LMP 01/09/2023 (Approximate)   SpO2 99%  Gen:   Awake, no distress   Resp:  Normal effort  MSK:   Moves extremities without difficulty  Other:  Cranial nerves II through XII grossly intact.  No nystagmus on EOM's.  Normal strength in bilateral upper and lower extremities.  Normal bilateral ear exam  Medical Decision Making  Medically screening exam initiated at 1:53 PM.  Appropriate orders placed.  Esli May Wenger was informed that the remainder of the evaluation will be completed by another provider, this initial triage assessment does not replace that evaluation, and the importance of remaining in the ED until their evaluation is complete.     Saddie Benders, PA-C 02/15/23 1355

## 2023-02-15 NOTE — ED Provider Notes (Signed)
[place] [facility] AT [facility] Provider Note   CSN: [id number] Arrival date & time: 02/15/23  1338     History {Add pertinent medical, surgical, social history, OB history to HPI:1} Chief Complaint  Patient presents with   Dizziness    Carmen Hoffman is a 52 y.o. female.  HPI     52 year old female with a history of migraines, von Willebrand disease, occipital neuralgia   4/28 dizziness started 5/1 urgent care, started on drops for ?otitis externa, and zofran for nausea Saturday worsened, went to urgent care, given amoxicillin and meczlizine Sunday was ok if didn't move fast. This AM was persistent dizziness, if sitting still is better. Worse with moving.  It never goes all the way away. It just becomes more tolerable.  No numbness, weakness, difficulty talking, no change in vision, just things spinning If would turn now would spin Staggering because feeling dizzy. Friday was able to walk but has been off since then.  No hx of vertigo in the past Headaches come and go  No hx of htn, hlpd, dm, or fam hx of stroke Has not smoked in one week No runny nose, not having ear pain other than when being examined No chest pain or dyspnea Nausea, (just emesis Tuesday) no diarrhea.   Past Medical History:  Diagnosis Date   ANEMIA-IRON DEFICIENCY 05/30/2007   Anxiety    Aphthous stomatitis    Arthritis    Cellulitis and abscess of buttock 07/12/2013   10/14 L    Depression    Genital herpes    GERD (gastroesophageal reflux disease)    History of hemorrhoids 06/02/2018   Migraines    Occipital neuralgia 03/27/2013   6/14 R 8/15 Left    Throat pain 07/26/2008   2015 s/p ENT eval     Von Willebrand disease (HCC) 1992     Home Medications Prior to Admission medications   Medication Sig Start Date End Date Taking? Authorizing Provider  amoxicillin (AMOXIL) 500 MG capsule Take 1 capsule (500 mg total) by mouth 2 (two) times daily for 5 days. 02/13/23  02/18/23  Rising, Lurena Joiner, PA-C  baclofen (LIORESAL) 10 MG tablet Take 1/2-1 tablets (5-10 mg total) by mouth 2 (two) times daily  if needed for muscle spasm 12/29/22   Raynelle Dick, NP  meclizine (ANTIVERT) 12.5 MG tablet Take 1 tablet (12.5 mg total) by mouth 3 (three) times daily as needed for dizziness. 02/13/23   Rising, Rebecca, PA-C  ondansetron (ZOFRAN-ODT) 4 MG disintegrating tablet Take 4 mg by mouth every 8 (eight) hours as needed for nausea or vomiting.    [provider]  thyroid (NP THYROID) 15 MG tablet Take 1 tablet (15 mg total) by mouth daily on an empty stomach 07/28/22     thyroid (NP THYROID) 30 MG tablet Take 1 tablet (30 mg total) by mouth daily on an empty stomach 07/28/22     traMADol (ULTRAM) 50 MG tablet TAKE 2 TABLETS (100 MG TOTAL) BY MOUTH EVERY 6 (SIX) HOURS AS NEEDED FOR UP TO 5 DAYS FOR MODERATE PAIN OR SEVERE PAIN. 04/23/21   Raynelle Dick, NP  valACYclovir (VALTREX) 500 MG tablet Take 1 tablet (500 mg total) by mouth daily FOR SUPPRESSION. TAKE TWICE DAILY FOR 3 DAYS FOR OUTBREAK. 11/13/22         Allergies    Diflucan [fluconazole]    Review of Systems   Review of Systems  Physical Exam Updated Vital Signs BP 117/71  Pulse 69   Temp 98.2 F (36.8 C) (Oral)   Resp 14   Ht 5\' 2"  (1.575 m)   Wt 62.6 kg   LMP 01/09/2023 (Approximate)   SpO2 99%   BMI 25.24 kg/m  Physical Exam  ED Results / Procedures / Treatments   Labs (all labs ordered are listed, but only abnormal results are displayed) Labs Reviewed  BASIC METABOLIC PANEL  CBC  URINALYSIS, ROUTINE W REFLEX MICROSCOPIC  CBG MONITORING, ED    EKG None  Radiology CT Head Wo Contrast  Result Date: 02/15/2023 CLINICAL DATA:  Head trauma, moderate-severe.  Dizziness. EXAM: CT HEAD WITHOUT CONTRAST TECHNIQUE: Contiguous axial images were obtained from the base of the skull through the vertex without intravenous contrast. RADIATION DOSE REDUCTION: This exam was performed according  to the departmental dose-optimization program which includes automated exposure control, adjustment of the mA and/or kV according to patient size and/or use of iterative reconstruction technique. COMPARISON:  None Available. FINDINGS: Brain: There is no evidence of an acute infarct, intracranial hemorrhage, mass, midline shift, or extra-axial fluid collection. The ventricles and sulci are normal. Vascular: No hyperdense vessel. Skull: No fracture or suspicious osseous lesion. Sinuses/Orbits: Visualized paranasal sinuses and mastoid air cells are clear. Unremarkable orbits. Other: None. IMPRESSION: Negative head CT. Electronically Signed   By: Sebastian Ache M.D.   On: 02/15/2023 15:06    Procedures Procedures  {Document cardiac monitor, telemetry assessment procedure when appropriate:1}  Medications Ordered in ED Medications - No data to display  ED Course/ Medical Decision Making/ A&P   {   Click here for ABCD2, HEART and other calculatorsREFRESH Note before signing :1}                          Medical Decision Making  ***  {Document critical care time when appropriate:1} {Document review of labs and clinical decision tools ie heart score, Chads2Vasc2 etc:1}  {Document your independent review of radiology images, and any outside records:1} {Document your discussion with family members, caretakers, and with consultants:1} {Document social determinants of health affecting pt's care:1} {Document your decision making why or why not admission, treatments were needed:1} Final Clinical Impression(s) / ED Diagnoses Final diagnoses:  None    Rx / DC Orders ED Discharge Orders     None

## 2023-02-15 NOTE — Progress Notes (Signed)
Because of ongoing and worsening symptoms despite in person evaluation and dizziness, I feel your condition warrants further evaluation and I recommend that you be seen in a face to face visit. If severe dizziness, you need an ER evaluation.    NOTE: There will be NO CHARGE for this eVisit   If you are having a true medical emergency please call 911.      For an urgent face to face visit, [place] has eight urgent care centers for your convenience:   NEW!! [facility] at [facility] Get Driving Directions [phone] [address] [place], [address]    [facility] at [facility] Get Driving Directions [phone] [address] [place], [place] [address]   [facility] Urgent Care Center [facility]) Get Driving Directions [phone] [address] [place], [place] [address]  [facility]gent Care Center [facility]) Get Driving Directions [phone] [address] [place],  [place]  [address]  [facility] Health Urgent Care Center [facility] - at [employer]  [phone] [address] [place],  [place] [address]   [facility] Care at [facility] Get Driving Directions [phone] [address] [place], [place] [address]   [facility] at [facility] Get Driving Directions  [phone] [address] [place], [place] [address]   [facility] at [facility] Directions [phone] [address], [address] [place], [place] [address]  Your MyChart E-visit questionnaire answers were reviewed by a board certified advanced clinical practitioner to complete your personal care plan based on your specific symptoms.  Thank you for using e-Visits.

## 2023-02-15 NOTE — ED Triage Notes (Signed)
Pt c/o dizziness and nauseax5d. Pt states the meds she was given at [facility] for these sx is not helping.

## 2023-02-15 NOTE — ED Notes (Signed)
Pt reports that HA has subsided after medications

## 2023-02-25 ENCOUNTER — Encounter: Payer: Commercial Managed Care - PPO | Admitting: Nurse Practitioner
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
## 2023-02-26 ENCOUNTER — Ambulatory Visit (INDEPENDENT_AMBULATORY_CARE_PROVIDER_SITE_OTHER): Payer: Commercial Managed Care - PPO | Admitting: Nurse Practitioner

## 2023-02-26 ENCOUNTER — Encounter: Payer: Self-pay | Admitting: Nurse Practitioner

## 2023-02-26 VITALS — BP 100/60 | HR 79 | Temp 97.9°F | Resp 16 | Ht 63.5 in | Wt 139.6 lb

## 2023-02-26 DIAGNOSIS — I10 Essential (primary) hypertension: Secondary | ICD-10-CM

## 2023-02-26 DIAGNOSIS — F3341 Major depressive disorder, recurrent, in partial remission: Secondary | ICD-10-CM

## 2023-02-26 DIAGNOSIS — A6000 Herpesviral infection of urogenital system, unspecified: Secondary | ICD-10-CM

## 2023-02-26 DIAGNOSIS — Z1389 Encounter for screening for other disorder: Secondary | ICD-10-CM | POA: Diagnosis not present

## 2023-02-26 DIAGNOSIS — E039 Hypothyroidism, unspecified: Secondary | ICD-10-CM

## 2023-02-26 DIAGNOSIS — Z0000 Encounter for general adult medical examination without abnormal findings: Secondary | ICD-10-CM | POA: Diagnosis not present

## 2023-02-26 DIAGNOSIS — M545 Low back pain, unspecified: Secondary | ICD-10-CM

## 2023-02-26 DIAGNOSIS — K5900 Constipation, unspecified: Secondary | ICD-10-CM

## 2023-02-26 DIAGNOSIS — R7309 Other abnormal glucose: Secondary | ICD-10-CM

## 2023-02-26 DIAGNOSIS — G8929 Other chronic pain: Secondary | ICD-10-CM

## 2023-02-26 DIAGNOSIS — Z720 Tobacco use: Secondary | ICD-10-CM

## 2023-02-26 DIAGNOSIS — I700 Atherosclerosis of aorta: Secondary | ICD-10-CM

## 2023-02-26 DIAGNOSIS — Z136 Encounter for screening for cardiovascular disorders: Secondary | ICD-10-CM

## 2023-02-26 DIAGNOSIS — Z79899 Other long term (current) drug therapy: Secondary | ICD-10-CM | POA: Diagnosis not present

## 2023-02-26 DIAGNOSIS — M4302 Spondylolysis, cervical region: Secondary | ICD-10-CM

## 2023-02-26 DIAGNOSIS — Z0001 Encounter for general adult medical examination with abnormal findings: Secondary | ICD-10-CM

## 2023-02-26 DIAGNOSIS — Z13220 Encounter for screening for lipoid disorders: Secondary | ICD-10-CM | POA: Diagnosis not present

## 2023-02-26 DIAGNOSIS — E559 Vitamin D deficiency, unspecified: Secondary | ICD-10-CM

## 2023-02-26 DIAGNOSIS — K2100 Gastro-esophageal reflux disease with esophagitis, without bleeding: Secondary | ICD-10-CM | POA: Diagnosis not present

## 2023-02-26 DIAGNOSIS — J449 Chronic obstructive pulmonary disease, unspecified: Secondary | ICD-10-CM

## 2023-02-26 DIAGNOSIS — D6800 Von Willebrand disease, unspecified: Secondary | ICD-10-CM

## 2023-02-26 LAB — CBC WITH DIFFERENTIAL/PLATELET
Hemoglobin: 11.4 g/dL — ABNORMAL LOW (ref 11.7–15.5)
MCH: 30.2 pg (ref 27.0–33.0)
MCV: 90.5 fL (ref 80.0–100.0)
Monocytes Relative: 8.1 %
WBC: 6 10*3/uL (ref 3.8–10.8)

## 2023-02-26 NOTE — Patient Instructions (Addendum)
Chest xray has been ordered at [facility] [address] [place] In M-F 8:30-3:45   Back Exercises These exercises help to make your trunk and back strong. They also help to keep the lower back flexible. Doing these exercises can help to prevent or lessen pain in your lower back. If you have back pain, try to do these exercises 2-3 times each day or as told by your doctor. As you get better, do the exercises once each day. Repeat the exercises more often as told by your doctor. To stop back pain from coming back, do the exercises once each day, or as told by your doctor. Do exercises exactly as told by your doctor. Stop right away if you feel sudden pain or your pain gets worse. Exercises Single knee to chest Do these steps 3-5 times in a row for each leg: Lie on your back on a firm bed or the floor with your legs stretched out. Bring one knee to your chest. Grab your knee or thigh with both hands and hold it in place. Pull on your knee until you feel a gentle stretch in your lower back or butt. Keep doing the stretch for 10-30 seconds. Slowly let go of your leg and straighten it. Pelvic tilt Do these steps 5-10 times in a row: Lie on your back on a firm bed or the floor with your legs stretched out. Bend your knees so they point up to the ceiling. Your feet should be flat on the floor. Tighten your lower belly (abdomen) muscles to press your lower back against the floor. This will make your tailbone point up to the ceiling instead of pointing down to your feet or the floor. Stay in this position for 5-10 seconds while you gently tighten your muscles and breathe evenly. Cat-cow Do these steps until your lower back bends more easily: Get on your hands and knees on a firm bed or the floor. Keep your hands under your shoulders, and keep your knees under your hips. You may put padding under your knees. Let your head hang down toward your chest. Tighten (contract) the muscles  in your belly. Point your tailbone toward the floor so your lower back becomes rounded like the back of a cat. Stay in this position for 5 seconds. Slowly lift your head. Let the muscles of your belly relax. Point your tailbone up toward the ceiling so your back forms a sagging arch like the back of a cow. Stay in this position for 5 seconds.  Press-ups Do these steps 5-10 times in a row: Lie on your belly (face-down) on a firm bed or the floor. Place your hands near your head, about shoulder-width apart. While you keep your back relaxed and keep your hips on the floor, slowly straighten your arms to raise the top half of your body and lift your shoulders. Do not use your back muscles. You may change where you place your hands to make yourself more comfortable. Stay in this position for 5 seconds. Keep your back relaxed. Slowly return to lying flat on the floor.  Bridges Do these steps 10 times in a row: Lie on your back on a firm bed or the floor. Bend your knees so they point up to the ceiling. Your feet should be flat on the floor. Your arms should be flat at your sides, next to your body. Tighten your butt muscles and lift your butt off the floor until your waist is almost as high  as your knees. If you do not feel the muscles working in your butt and the back of your thighs, slide your feet 1-2 inches (2.5-5 cm) farther away from your butt. Stay in this position for 3-5 seconds. Slowly lower your butt to the floor, and let your butt muscles relax. If this exercise is too easy, try doing it with your arms crossed over your chest. Belly crunches Do these steps 5-10 times in a row: Lie on your back on a firm bed or the floor with your legs stretched out. Bend your knees so they point up to the ceiling. Your feet should be flat on the floor. Cross your arms over your chest. Tip your chin a little bit toward your chest, but do not bend your neck. Tighten your belly muscles and slowly raise  your chest just enough to lift your shoulder blades a tiny bit off the floor. Avoid raising your body higher than that because it can put too much stress on your lower back. Slowly lower your chest and your head to the floor. Back lifts Do these steps 5-10 times in a row: Lie on your belly (face-down) with your arms at your sides, and rest your forehead on the floor. Tighten the muscles in your legs and your butt. Slowly lift your chest off the floor while you keep your hips on the floor. Keep the back of your head in line with the curve in your back. Look at the floor while you do this. Stay in this position for 3-5 seconds. Slowly lower your chest and your face to the floor. Contact a doctor if: Your back pain gets a lot worse when you do an exercise. Your back pain does not get better within 2 hours after you exercise. If you have any of these problems, stop doing the exercises. Do not do them again unless your doctor says it is okay. Get help right away if: You have sudden, very bad back pain. If this happens, stop doing the exercises. Do not do them again unless your doctor says it is okay. This information is not intended to replace advice given to you by your health care provider. Make sure you discuss any questions you have with your health care provider. Document Revised: 12/11/2020 Document Reviewed: 12/11/2020 Elsevier Patient Education  2023 [employer].

## 2023-02-27 LAB — HEMOGLOBIN A1C
Hgb A1c MFr Bld: 5.5 % of total Hgb (ref <5.7)
Mean Plasma Glucose: 111 mg/dL
eAG (mmol/L): 6.2 mmol/L

## 2023-02-27 LAB — URINALYSIS, ROUTINE W REFLEX MICROSCOPIC
Bilirubin Urine: NEGATIVE
Glucose, UA: NEGATIVE
Hgb urine dipstick: NEGATIVE
Ketones, ur: NEGATIVE
Leukocytes,Ua: NEGATIVE
Nitrite: NEGATIVE
Protein, ur: NEGATIVE
Specific Gravity, Urine: 1.024 (ref 1.001–1.035)
pH: 6 (ref 5.0–8.0)

## 2023-02-27 LAB — COMPLETE METABOLIC PANEL WITH GFR
AG Ratio: 2 (calc) (ref 1.0–2.5)
ALT: 18 U/L (ref 6–29)
AST: 21 U/L (ref 10–35)
Albumin: 4.2 g/dL (ref 3.6–5.1)
Alkaline phosphatase (APISO): 52 U/L (ref 37–153)
BUN: 17 mg/dL (ref 7–25)
CO2: 30 mmol/L (ref 20–32)
Calcium: 9.4 mg/dL (ref 8.6–10.4)
Chloride: 102 mmol/L (ref 98–110)
Creat: 0.58 mg/dL (ref 0.50–1.03)
Globulin: 2.1 g/dL (calc) (ref 1.9–3.7)
Glucose, Bld: 89 mg/dL (ref 65–99)
Potassium: 4.3 mmol/L (ref 3.5–5.3)
Sodium: 139 mmol/L (ref 135–146)
Total Bilirubin: 0.3 mg/dL (ref 0.2–1.2)
Total Protein: 6.3 g/dL (ref 6.1–8.1)
eGFR: 109 mL/min/{1.73_m2} (ref >=60)

## 2023-02-27 LAB — CBC WITH DIFFERENTIAL/PLATELET
Absolute Monocytes: 486 cells/uL (ref 200–950)
Basophils Absolute: 30 cells/uL (ref 0–200)
Basophils Relative: 0.5 %
Eosinophils Absolute: 90 cells/uL (ref 15–500)
Eosinophils Relative: 1.5 %
HCT: 34.1 % — ABNORMAL LOW (ref 35.0–45.0)
Lymphs Abs: 1944 cells/uL (ref 850–3900)
MCHC: 33.4 g/dL (ref 32.0–36.0)
MPV: 11.5 fL (ref 7.5–12.5)
Neutro Abs: 3450 cells/uL (ref 1500–7800)
Neutrophils Relative %: 57.5 %
Platelets: 156 10*3/uL (ref 140–400)
RBC: 3.77 10*6/uL — ABNORMAL LOW (ref 3.80–5.10)
RDW: 12.5 % (ref 11.0–15.0)
Total Lymphocyte: 32.4 %

## 2023-02-27 LAB — LIPID PANEL
Cholesterol: 147 mg/dL (ref <200)
HDL: 50 mg/dL (ref >=50)
LDL Cholesterol (Calc): 80 mg/dL (calc)
Non-HDL Cholesterol (Calc): 97 mg/dL (calc) (ref <130)
Total CHOL/HDL Ratio: 2.9 (calc) (ref <5.0)
Triglycerides: 86 mg/dL (ref <150)

## 2023-02-27 LAB — VITAMIN D 25 HYDROXY (VIT D DEFICIENCY, FRACTURES): Vit D, 25-Hydroxy: 26 ng/mL — ABNORMAL LOW (ref 30–100)

## 2023-02-27 LAB — TSH: TSH: 2.3 mIU/L

## 2023-02-27 LAB — MICROALBUMIN / CREATININE URINE RATIO
Creatinine, Urine: 105 mg/dL (ref 20–275)
Microalb Creat Ratio: 14 mg/g creat (ref <30)
Microalb, Ur: 1.5 mg/dL

## 2023-02-27 LAB — MAGNESIUM: Magnesium: 1.8 mg/dL (ref 1.5–2.5)

## 2023-03-12 DIAGNOSIS — N393 Stress incontinence (female) (male): Secondary | ICD-10-CM | POA: Diagnosis not present

## 2023-03-12 DIAGNOSIS — N8189 Other female genital prolapse: Secondary | ICD-10-CM | POA: Diagnosis not present

## 2023-03-12 DIAGNOSIS — N3941 Urge incontinence: Secondary | ICD-10-CM | POA: Diagnosis not present

## 2023-03-12 DIAGNOSIS — N3281 Overactive bladder: Secondary | ICD-10-CM | POA: Diagnosis not present

## 2023-04-01 ENCOUNTER — Encounter: Payer: Self-pay | Admitting: Internal Medicine

## 2023-04-21 ENCOUNTER — Ambulatory Visit
Admission: RE | Admit: 2023-04-21 | Discharge: 2023-04-21 | Disposition: A | Discharge status: Home / Self Care | Payer: Commercial Managed Care - PPO | Source: Ambulatory Visit | Attending: Nurse Practitioner | Admitting: Nurse Practitioner

## 2023-04-21 DIAGNOSIS — F17219 Nicotine dependence, cigarettes, with unspecified nicotine-induced disorders: Secondary | ICD-10-CM | POA: Diagnosis not present

## 2023-04-21 DIAGNOSIS — Z720 Tobacco use: Secondary | ICD-10-CM

## 2023-05-05 ENCOUNTER — Encounter: Payer: Self-pay | Admitting: Internal Medicine

## 2023-06-09 ENCOUNTER — Other Ambulatory Visit: Payer: Self-pay | Admitting: Oncology

## 2023-06-09 DIAGNOSIS — Z006 Encounter for examination for normal comparison and control in clinical research program: Secondary | ICD-10-CM

## 2023-06-17 ENCOUNTER — Other Ambulatory Visit
Admission: RE | Admit: 2023-06-17 | Discharge: 2023-06-17 | Disposition: A | Discharge status: Home / Self Care | Payer: Commercial Managed Care - PPO | Attending: Oncology | Admitting: Oncology

## 2023-06-17 DIAGNOSIS — Z006 Encounter for examination for normal comparison and control in clinical research program: Secondary | ICD-10-CM | POA: Insufficient documentation

## 2023-06-21 NOTE — Group Note (Deleted)

## 2023-06-29 LAB — GENECONNECT MOLECULAR SCREEN: Genetic Analysis Overall Interpretation: NEGATIVE

## 2023-08-09 ENCOUNTER — Other Ambulatory Visit (HOSPITAL_COMMUNITY): Payer: Self-pay

## 2023-08-09 MED ORDER — THYROID 30 MG PO TABS
30.0000 mg | ORAL_TABLET | Freq: Every day | ORAL | 4 refills | Status: DC
  Filled 2023-08-09: qty 90, 90d supply, fill #0
  Filled 2023-11-04: qty 90, 90d supply, fill #1
  Filled 2024-02-02: qty 90, 90d supply, fill #2
  Filled 2024-05-01: qty 90, 90d supply, fill #3
  Filled 2024-08-02: qty 90, 90d supply, fill #4

## 2023-08-09 MED ORDER — THYROID 15 MG PO TABS
15.0000 mg | ORAL_TABLET | Freq: Every day | ORAL | 5 refills | Status: DC
  Filled 2023-08-09: qty 90, 90d supply, fill #0
  Filled 2023-11-04: qty 90, 90d supply, fill #1

## 2023-08-11 ENCOUNTER — Other Ambulatory Visit (HOSPITAL_COMMUNITY): Payer: Self-pay

## 2023-08-31 NOTE — Progress Notes (Unsigned)
6 MONTH FOLLOW UP  Assessment and Plan:   Tobacco abuse disorder Smoking cessation-  instruction/counseling given, counseled patient on the dangers of tobacco use, advised patient to stop smoking, and reviewed strategies to maximize success, patient not ready to quit at this time.  Declines CXR  Chronic obstructive pulmonary disease, unspecified COPD type (HCC) No triggers, well controlled symptoms, cont to monitor STOP SMOKING, check CXR routinely, start lung cancer screening age 29  Atherosclerosis of aorta (HCC) Control blood pressure, cholesterol, glucose, increase exercise.  STOP SMOKING -     Lipid panel  Hypothyroidism, unspecified type Dr. Talmage Nap follows, has upcoming appointment Continue thyroid medication as recommended -TSH  Depression, major, recurrent, in partial remission (HCC)/ Anxiety  Doing much better since starting Lexapro, continue medication Stress management techniques discussed, increase water, good sleep hygiene discussed, increase exercise, and increase veggies.  Denies thought of hurting self or others  B12 deficiency -      continue B12 supplement, check level PRN  Vitamin D deficiency -     VITAMIN D 25 Hydroxy (Vit-D Deficiency, Fractures)  Constipation, unspecified constipation type Following up with Dr Christella Hartigan Currently well controlled  Chronic left knee pain Continue antiinflammatories If worsens notify the office and will refer to orthopedics   Gastroesophageal reflux disease with esophagitis Currently well controlled without medication - Magnesium  Cervical spondylosis/chronic neck pain Neck pain is currently improved Continue to monitor  Medication management -     CBC with Differential/Platelet -     COMPLETE METABOLIC PANEL WITH GFR -     Iron, TIBC and Ferritin Panel -     Magnesium -     TSH -     Vitamin B12  Anemia, unspecified type Not currently on supplementation -     CBC with Differential/Platelet -     Iron,  TIBC and Ferritin Panel -     Vitamin B12  Dysuria -     Urinalysis, Routine w reflex microscopic -     Urine Culture     Discussed med's effects and SE's. Screening labs and tests as requested with regular follow-up as recommended. Over 40 minutes of exam, counseling, chart review and critical decision making was performed  Future Appointments  Date Time Provider Department Center  02/28/2024  9:00 AM Raynelle Dick, NP [facility] None     HPI  This very nice 52 y.o.female presents for 3 month follow up of aortic atherosclerosis, hypothyroidism, COPD, GERD , Depression  She works as Water quality scientist, [employer], married, 2 grown kids, has a grandchild  Midwife with GYN - follows with Dr. Meisinger's office annually.   She has been noting some dysuria, denies frequency and hematuria   She has persistent neck pain, left side is currently worse than right. Occasional use of Tramadol.   She does work nights and has done so for 18 years.   She has started back to smoking. stopped 2017, started back 2021, 32 years, 0.5 pack on average. Smoking some days a little less than 1/2 a pack. Not interested in quitting at this time.   BMI is Body mass index is 24.52 kg/m., she has not been working on diet and exercise, tries to eat healthy, veggies, protein.  Wt Readings from Last 3 Encounters:  09/01/23 140 lb 9.6 oz (63.8 kg)  02/26/23 139 lb 9.6 oz (63.3 kg)  02/15/23 138 lb 0.1 oz (62.6 kg)   Her blood pressure has been controlled at home, today their BP is BP: Marland Kitchen)  100/58  BP Readings from Last 3 Encounters:  09/01/23 (!) 100/58  02/26/23 100/60  02/15/23 120/77  She does not workout, she was walking her dogs. She denies chest pain, shortness of breath, dizziness.   She has aortic atherosclerosis per CXR 2017   She is not on cholesterol medication and denies myalgias. Her cholesterol is at goal. The cholesterol last visit was:   Lab Results  Component Value Date   CHOL  147 02/26/2023   HDL 50 02/26/2023   LDLCALC 80 02/26/2023   TRIG 86 02/26/2023   CHOLHDL 2.9 02/26/2023   Last E9B in the office was:  Lab Results  Component Value Date   HGBA1C 5.5 02/26/2023   Patient is on Vitamin D supplement, she is on 5000 IU of vitamin D daily when she remembers to take Lab Results  Component Value Date   VD25OH 26 (L) 02/26/2023     Was on B12 injections in the past, she is on B12 sublingual, taking 5000 mcg daily Lab Results  Component Value Date   VITAMINB12 >2,000 (H) 02/21/2020   She is on thyroid medication. Her medication was not changed last visit, taking 45 mg NP thryoid, Dr. Talmage Nap follows Lab Results  Component Value Date   TSH 2.30 02/26/2023   Last CBC did show some anemia Lab Results  Component Value Date   WBC 6.0 02/26/2023   HGB 11.4 (L) 02/26/2023   HCT 34.1 (L) 02/26/2023   MCV 90.5 02/26/2023   PLT 156 02/26/2023       Current Medications:  Current Outpatient Medications on File Prior to Visit  Medication Sig Dispense Refill   thyroid (NP THYROID) 15 MG tablet Take 1 tablet (15 mg total) by mouth daily on an empty stomach 90 tablet 5   thyroid (NP THYROID) 30 MG tablet Take 1 tablet (30 mg total) by mouth daily on an empty stomach 90 tablet 4   traMADol (ULTRAM) 50 MG tablet TAKE 2 TABLETS (100 MG TOTAL) BY MOUTH EVERY 6 (SIX) HOURS AS NEEDED FOR UP TO 5 DAYS FOR MODERATE PAIN OR SEVERE PAIN. (Patient taking differently: Take 100 mg by mouth every 6 (six) hours as needed for moderate pain (pain score 4-6).) 30 tablet 0   valACYclovir (VALTREX) 500 MG tablet Take 1 tablet (500 mg total) by mouth daily FOR SUPPRESSION. TAKE TWICE DAILY FOR 3 DAYS FOR OUTBREAK. 60 tablet 2   baclofen (LIORESAL) 10 MG tablet Take 1/2-1 tablets (5-10 mg total) by mouth 2 (two) times daily  if needed for muscle spasm (Patient not taking: Reported on 09/01/2023) 60 tablet 1   meclizine (ANTIVERT) 12.5 MG tablet Take 1 tablet (12.5 mg total) by mouth 3  (three) times daily as needed for dizziness. (Patient not taking: Reported on 09/01/2023) 30 tablet 0   ondansetron (ZOFRAN-ODT) 4 MG disintegrating tablet Take 4 mg by mouth every 8 (eight) hours as needed for nausea or vomiting. (Patient not taking: Reported on 09/01/2023)     No current facility-administered medications on file prior to visit.   Health Maintenance:   Immunization History  Administered Date(s) Administered   Influenza Split 07/18/2012   Influenza,trivalent, recombinat, inj, PF 07/06/2023   Influenza-Unspecified 07/11/2013   Tdap 02/20/2021   TD/TDAP: Tdap today  Influenza: gets at work, last 2021 Pneumovax:  Defer,  COVID 19: 2/2, pfizer - send information   LMP: No LMP recorded. Pap:  Has GYN, Eveline Keto, last 2022 Sees annually  MGM: 2022 by GYN  Colonoscopy 2020 Dr. Christella Hartigan, plyps, 7 year recall  EGD 08/2014  Last eye: Glasses, goes annually, Dr. ? Last dental: goes q62m, 2022 Last derm: 2022, Dr. 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 Surgical History:  She  has a past surgical history that includes Nose surgery (1991); Tubal ligation (Bilateral, 1988); Colonoscopy; and Esophagogastroduodenoscopy. Family History:  Her family history includes Arthritis in her brother; Bladder Cancer in her father; Breast cancer in her cousin; COPD in her father; Cancer in her paternal uncle; Colon cancer in her paternal aunt; Fibromyalgia in her  mother; Healthy in her daughter and son; Heart disease in her maternal grandfather; Hypertension in her father and mother; Pancreatic cancer (age of onset: 41) in her mother; Thyroid disease in her brother. Social History:   reports that she has been smoking cigarettes. She started smoking about 39 years ago. She has a 16.5 pack-year smoking history. She has never used smokeless tobacco. She reports that she does not drink alcohol and does not use drugs.  Review of Systems: Review of Systems  Constitutional:  Negative for chills, fever and weight loss.  HENT:  Negative for congestion, ear discharge, ear pain, hearing loss, nosebleeds, sore throat and tinnitus.   Eyes: Negative.  Negative for blurred vision and double vision.  Respiratory: Negative.  Negative for cough, shortness of breath, wheezing and stridor.   Cardiovascular: Negative.  Negative for chest pain, palpitations, orthopnea, claudication and leg swelling.  Gastrointestinal: Negative.  Negative for abdominal pain, blood in stool, constipation, diarrhea, heartburn, melena, nausea and vomiting.  Genitourinary:  Positive for dysuria.  Musculoskeletal:  Positive for joint pain (knees bilaterally) and neck pain. Negative for back pain and myalgias.  Skin: Negative.  Negative for rash.  Neurological:  Negative for dizziness, tingling, sensory change, focal weakness, loss of consciousness, weakness and headaches.  Endo/Heme/Allergies: Negative.  Negative for polydipsia.  Psychiatric/Behavioral:  Positive for depression (currently controlled without meds). Negative for substance abuse and suicidal ideas. The patient is not nervous/anxious and does not have insomnia.   All other systems reviewed and are negative.   Physical Exam: Estimated body mass index is 24.52 kg/m as calculated from the following:   Height as of this encounter: 5' 3.5" (1.613 m).   Weight as of this encounter: 140 lb 9.6 oz (63.8 kg). BP (!) 100/58   Pulse 74    Temp (!) 97.5 F (36.4 C)   Ht 5' 3.5" (1.613 m)   Wt 140 lb 9.6 oz (63.8 kg)   SpO2 96%   BMI 24.52 kg/m  General Appearance: Well nourished, in no apparent distress.  Eyes: PERRLA, EOMs, conjunctiva no swelling or erythema Sinuses: No Frontal/maxillary tenderness  ENT/Mouth: Ext aud canals clear, normal light reflex with TMs without erythema, bulging. Good dentition without abscesses. Tonsils not swollen or erythematous. Hearing normal.  Neck: Supple, thyroid normal.  Respiratory: Respiratory effort normal, BS equal bilaterally without rales, rhonchi, wheezing or stridor.  Cardio: RRR without murmurs, rubs or gallops. Brisk peripheral pulses without edema.  Abdomen: Soft, nontender, no guarding,  rebound, hernias, masses, or organomegaly.  Lymphatics: Non tender without lymphadenopathy.  Musculoskeletal: Full ROM all peripheral extremities, 5/5 strength, and normal gait Skin: Warm, dry without rashes, lesions, ecchymosis. Neuro: Cranial nerves intact, reflexes equal bilaterally. Normal muscle tone, no cerebellar symptoms. Sensation intact.  Psych: Awake and oriented X 3, avoidant, flat affect, Insight and Judgment appropriate.     Raynelle Dick 9:42 AM [facility]

## 2023-09-01 ENCOUNTER — Encounter: Payer: Self-pay | Admitting: Nurse Practitioner

## 2023-09-01 ENCOUNTER — Ambulatory Visit (INDEPENDENT_AMBULATORY_CARE_PROVIDER_SITE_OTHER): Payer: Commercial Managed Care - PPO | Admitting: Nurse Practitioner

## 2023-09-01 DIAGNOSIS — I700 Atherosclerosis of aorta: Secondary | ICD-10-CM

## 2023-09-01 DIAGNOSIS — E559 Vitamin D deficiency, unspecified: Secondary | ICD-10-CM

## 2023-09-01 DIAGNOSIS — E538 Deficiency of other specified B group vitamins: Secondary | ICD-10-CM | POA: Diagnosis not present

## 2023-09-01 DIAGNOSIS — E039 Hypothyroidism, unspecified: Secondary | ICD-10-CM

## 2023-09-01 DIAGNOSIS — M25562 Pain in left knee: Secondary | ICD-10-CM | POA: Diagnosis not present

## 2023-09-01 DIAGNOSIS — J449 Chronic obstructive pulmonary disease, unspecified: Secondary | ICD-10-CM | POA: Diagnosis not present

## 2023-09-01 DIAGNOSIS — Z79899 Other long term (current) drug therapy: Secondary | ICD-10-CM | POA: Diagnosis not present

## 2023-09-01 DIAGNOSIS — K2100 Gastro-esophageal reflux disease with esophagitis, without bleeding: Secondary | ICD-10-CM | POA: Diagnosis not present

## 2023-09-01 DIAGNOSIS — D649 Anemia, unspecified: Secondary | ICD-10-CM

## 2023-09-01 DIAGNOSIS — Z720 Tobacco use: Secondary | ICD-10-CM

## 2023-09-01 DIAGNOSIS — G8929 Other chronic pain: Secondary | ICD-10-CM

## 2023-09-01 DIAGNOSIS — M542 Cervicalgia: Secondary | ICD-10-CM

## 2023-09-01 DIAGNOSIS — R300 Dysuria: Secondary | ICD-10-CM

## 2023-09-01 DIAGNOSIS — F3341 Major depressive disorder, recurrent, in partial remission: Secondary | ICD-10-CM

## 2023-09-01 DIAGNOSIS — M4302 Spondylolysis, cervical region: Secondary | ICD-10-CM

## 2023-09-01 NOTE — Patient Instructions (Signed)

## 2023-09-02 LAB — CBC WITH DIFFERENTIAL/PLATELET
Absolute Lymphocytes: 2723 {cells}/uL (ref 850–3900)
Absolute Monocytes: 570 {cells}/uL (ref 200–950)
Basophils Absolute: 38 {cells}/uL (ref 0–200)
Basophils Relative: 0.5 %
Eosinophils Absolute: 98 {cells}/uL (ref 15–500)
Eosinophils Relative: 1.3 %
HCT: 36.8 % (ref 35.0–45.0)
Hemoglobin: 12 g/dL (ref 11.7–15.5)
MCH: 30 pg (ref 27.0–33.0)
MCHC: 32.6 g/dL (ref 32.0–36.0)
MCV: 92 fL (ref 80.0–100.0)
MPV: 11.2 fL (ref 7.5–12.5)
Monocytes Relative: 7.6 %
Neutro Abs: 4073 {cells}/uL (ref 1500–7800)
Neutrophils Relative %: 54.3 %
Platelets: 213 10*3/uL (ref 140–400)
RBC: 4 10*6/uL (ref 3.80–5.10)
RDW: 12.1 % (ref 11.0–15.0)
Total Lymphocyte: 36.3 %
WBC: 7.5 10*3/uL (ref 3.8–10.8)

## 2023-09-02 LAB — IRON,TIBC AND FERRITIN PANEL
%SAT: 12 % — ABNORMAL LOW (ref 16–45)
Ferritin: 103 ng/mL (ref 16–232)
Iron: 36 ug/dL — ABNORMAL LOW (ref 45–160)
TIBC: 307 ug/dL (ref 250–450)

## 2023-09-02 LAB — VITAMIN B12: Vitamin B-12: 342 pg/mL (ref 200–1100)

## 2023-09-02 LAB — URINALYSIS, ROUTINE W REFLEX MICROSCOPIC
Bilirubin Urine: NEGATIVE
Glucose, UA: NEGATIVE
Hgb urine dipstick: NEGATIVE
Hyaline Cast: NONE SEEN /[LPF]
Ketones, ur: NEGATIVE
Leukocytes,Ua: NEGATIVE
Nitrite: NEGATIVE
RBC / HPF: NONE SEEN /[HPF] (ref 0–2)
Specific Gravity, Urine: 1.025 (ref 1.001–1.035)
WBC, UA: NONE SEEN /[HPF] (ref 0–5)
pH: 5.5 (ref 5.0–8.0)

## 2023-09-02 LAB — COMPLETE METABOLIC PANEL WITH GFR
AG Ratio: 1.8 (calc) (ref 1.0–2.5)
ALT: 14 U/L (ref 6–29)
AST: 17 U/L (ref 10–35)
Albumin: 4.3 g/dL (ref 3.6–5.1)
Alkaline phosphatase (APISO): 63 U/L (ref 37–153)
BUN: 10 mg/dL (ref 7–25)
CO2: 28 mmol/L (ref 20–32)
Calcium: 9.5 mg/dL (ref 8.6–10.4)
Chloride: 103 mmol/L (ref 98–110)
Creat: 0.53 mg/dL (ref 0.50–1.03)
Globulin: 2.4 g/dL (ref 1.9–3.7)
Glucose, Bld: 86 mg/dL (ref 65–99)
Potassium: 4.1 mmol/L (ref 3.5–5.3)
Sodium: 138 mmol/L (ref 135–146)
Total Bilirubin: 0.3 mg/dL (ref 0.2–1.2)
Total Protein: 6.7 g/dL (ref 6.1–8.1)
eGFR: 112 mL/min/{1.73_m2} (ref >=60)

## 2023-09-02 LAB — URINE CULTURE
MICRO NUMBER:: 15758919
Result:: NO GROWTH
SPECIMEN QUALITY:: ADEQUATE

## 2023-09-02 LAB — MAGNESIUM: Magnesium: 2 mg/dL (ref 1.5–2.5)

## 2023-09-02 LAB — TSH: TSH: 2.94 m[IU]/L

## 2023-09-02 LAB — MICROSCOPIC MESSAGE

## 2023-09-08 DIAGNOSIS — E039 Hypothyroidism, unspecified: Secondary | ICD-10-CM | POA: Diagnosis not present

## 2023-09-16 ENCOUNTER — Other Ambulatory Visit (HOSPITAL_COMMUNITY): Payer: Self-pay

## 2023-09-16 DIAGNOSIS — E039 Hypothyroidism, unspecified: Secondary | ICD-10-CM | POA: Diagnosis not present

## 2023-09-16 DIAGNOSIS — N926 Irregular menstruation, unspecified: Secondary | ICD-10-CM | POA: Diagnosis not present

## 2023-09-16 DIAGNOSIS — E8881 Metabolic syndrome: Secondary | ICD-10-CM | POA: Diagnosis not present

## 2023-09-16 MED ORDER — THYROID 30 MG PO TABS: 30.0000 mg | ORAL_TABLET | Freq: Every day | ORAL | 5 refills | Status: DC

## 2023-09-16 MED ORDER — THYROID 15 MG PO TABS
15.0000 mg | ORAL_TABLET | Freq: Every day | ORAL | 5 refills | Status: DC
  Filled 2023-09-16 – 2024-02-02 (×2): qty 90, 90d supply, fill #0
  Filled 2024-05-01: qty 90, 90d supply, fill #1
  Filled 2024-08-02: qty 90, 90d supply, fill #2

## 2023-09-22 DIAGNOSIS — N9089 Other specified noninflammatory disorders of vulva and perineum: Secondary | ICD-10-CM | POA: Diagnosis not present

## 2023-11-15 ENCOUNTER — Other Ambulatory Visit: Payer: Self-pay | Admitting: Obstetrics and Gynecology

## 2023-11-15 ENCOUNTER — Encounter: Payer: Self-pay | Admitting: Obstetrics and Gynecology

## 2023-11-15 DIAGNOSIS — Z01411 Encounter for gynecological examination (general) (routine) with abnormal findings: Secondary | ICD-10-CM | POA: Diagnosis not present

## 2023-11-15 DIAGNOSIS — Z1389 Encounter for screening for other disorder: Secondary | ICD-10-CM | POA: Diagnosis not present

## 2023-11-15 DIAGNOSIS — N644 Mastodynia: Secondary | ICD-10-CM

## 2023-11-15 DIAGNOSIS — Z130 Encounter for screening for diseases of the blood and blood-forming organs and certain disorders involving the immune mechanism: Secondary | ICD-10-CM | POA: Diagnosis not present

## 2023-11-30 DIAGNOSIS — H5213 Myopia, bilateral: Secondary | ICD-10-CM | POA: Diagnosis not present

## 2023-11-30 DIAGNOSIS — H524 Presbyopia: Secondary | ICD-10-CM | POA: Diagnosis not present

## 2023-11-30 DIAGNOSIS — H52203 Unspecified astigmatism, bilateral: Secondary | ICD-10-CM | POA: Diagnosis not present

## 2023-12-17 ENCOUNTER — Ambulatory Visit
Admission: RE | Admit: 2023-12-17 | Discharge: 2023-12-17 | Disposition: A | Discharge status: Home / Self Care | Payer: Commercial Managed Care - PPO | Source: Ambulatory Visit | Attending: Obstetrics and Gynecology | Admitting: Obstetrics and Gynecology

## 2023-12-17 ENCOUNTER — Encounter (HOSPITAL_COMMUNITY): Payer: Self-pay

## 2023-12-17 ENCOUNTER — Ambulatory Visit (HOSPITAL_COMMUNITY)
Admission: EM | Admit: 2023-12-17 | Discharge: 2023-12-17 | Disposition: A | Discharge status: Home / Self Care | Attending: Family Medicine | Admitting: Family Medicine

## 2023-12-17 ENCOUNTER — Other Ambulatory Visit: Payer: Self-pay

## 2023-12-17 DIAGNOSIS — J029 Acute pharyngitis, unspecified: Secondary | ICD-10-CM | POA: Insufficient documentation

## 2023-12-17 DIAGNOSIS — J069 Acute upper respiratory infection, unspecified: Secondary | ICD-10-CM | POA: Insufficient documentation

## 2023-12-17 DIAGNOSIS — N6320 Unspecified lump in the left breast, unspecified quadrant: Secondary | ICD-10-CM | POA: Diagnosis not present

## 2023-12-17 DIAGNOSIS — N644 Mastodynia: Secondary | ICD-10-CM

## 2023-12-17 LAB — POCT RAPID STREP A (OFFICE): Rapid Strep A Screen: NEGATIVE

## 2023-12-17 LAB — POC COVID19/FLU A&B COMBO
Covid Antigen, POC: NEGATIVE
Influenza A Antigen, POC: NEGATIVE
Influenza B Antigen, POC: NEGATIVE

## 2023-12-17 NOTE — ED Provider Notes (Signed)
 [facility]    CSN: [id number] Arrival date & time: 12/17/23  1617      History   Chief Complaint Chief Complaint  Patient presents with   Cough    HPI Carmen Hoffman is a 53 y.o. female.    Cough Associated symptoms: chills, rhinorrhea and sore throat    Patient is here for uri symptoms.  Sore throat x 3 days, then cough, congestion, headache, chills since last night.  No known fevers.  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  No otc meds taken.       Past Medical History:  Diagnosis Date   ANEMIA-IRON DEFICIENCY 05/30/2007   Anxiety    Aphthous stomatitis    Arthritis    Cellulitis and abscess of buttock 07/12/2013   10/14 L    Depression    Genital herpes    GERD (gastroesophageal reflux disease)    History of hemorrhoids 06/02/2018   Migraines    Occipital neuralgia 03/27/2013   6/14 R 8/15 Left    Throat pain 07/26/2008   2015 s/p ENT eval     Von Willebrand disease (HCC) 1992    Patient Active Problem List   Diagnosis Date Noted   Cervical spondylolysis 02/20/2021   ANA positive 08/15/2019   HLA B27 (HLA B27 positive) 08/15/2019   Hypothyroidism 08/15/2019   Constipation 06/02/2018   COPD (chronic obstructive pulmonary disease) (HCC) 06/18/2016   Atherosclerosis of aorta (HCC) 06/18/2016   Tobacco abuse disorder 06/06/2014   Headache, tension-type 05/17/2013   Neck pain on right side 03/27/2013   B12 deficiency 10/17/2012   Vitamin D deficiency 10/17/2012   GERD (gastroesophageal reflux disease)    Genital herpes    Depression, major, recurrent, 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      Home Medications    Prior to Admission medications   Medication  Sig Start Date End Date Taking? Authorizing Provider  thyroid (NP THYROID) 15 MG tablet Take 1 tablet (15 mg total) by mouth daily on an empty stomach 08/09/23     thyroid (NP THYROID) 15 MG tablet Take 1 tablet (15 mg total) by mouth daily. 09/16/23     thyroid (NP THYROID) 30 MG tablet Take 1 tablet (30 mg total) by mouth daily on an empty stomach 08/09/23     thyroid (NP THYROID) 30 MG tablet Take 1 tablet (30 mg total) by mouth daily. 09/16/23     valACYclovir (VALTREX) 500 MG tablet Take 1 tablet (500 mg total) by mouth daily FOR SUPPRESSION. TAKE TWICE DAILY FOR 3 DAYS FOR OUTBREAK. 11/13/22       Family History Family History  Problem Relation Age of Onset   Fibromyalgia Mother    Hypertension Mother    Pancreatic cancer Mother 57   Hypertension Father    Bladder Cancer Father        90s, smoker   COPD Father    Arthritis Brother        rheumatoid   Healthy Daughter    Healthy Son    Cancer Paternal Uncle        ? Leukemia   Heart disease Maternal  Grandfather    Breast cancer Cousin    Colon cancer Paternal Aunt    Thyroid disease Brother        Hyperthyroidism   Colon polyps Neg Hx    Rectal cancer Neg Hx    Stomach cancer Neg Hx    Esophageal cancer Neg Hx     Social History Social History   Tobacco Use   Smoking status: Every Day    Current packs/day: 0.00    Average packs/day: 0.5 packs/day for 33.0 years (16.5 ttl pk-yrs)    Types: Cigarettes    Start date: 14    Last attempt to quit: 05/24/2016    Years since quitting: 7.5   Smokeless tobacco: Never   Tobacco comments:    stopped 2017, started back 2021  Vaping Use   Vaping status: Never Used  Substance Use Topics   Alcohol use: No   Drug use: No     Allergies   Diflucan [fluconazole]   Review of Systems Review of Systems  Constitutional:  Positive for chills and fatigue.  HENT:  Positive for congestion, rhinorrhea and sore throat.   Respiratory:  Positive for cough.   Cardiovascular:  Negative.   Gastrointestinal: Negative.   Genitourinary: Negative.   Musculoskeletal: Negative.   Psychiatric/Behavioral: Negative.       Physical Exam Triage Vital Signs ED Triage Vitals [12/17/23 1631]  Encounter Vitals Group     BP 122/79     Systolic BP Percentile      Diastolic BP Percentile      Pulse Rate 83     Resp 18     Temp 99.1 F (37.3 C)     Temp Source Oral     SpO2 98 %     Weight      Height      Head Circumference      Peak Flow      Pain Score 6     Pain Loc      Pain Education      Exclude from Growth Chart    No data found.  Updated Vital Signs BP 122/79 (BP Location: Right Arm)   Pulse 83   Temp 99.1 F (37.3 C) (Oral)   Resp 18   SpO2 98%   Visual Acuity Right Eye Distance:   Left Eye Distance:   Bilateral Distance:    Right Eye Near:   Left Eye Near:    Bilateral Near:     Physical Exam Constitutional:      Appearance: Normal appearance.  HENT:     Nose: Congestion and rhinorrhea present.     Mouth/Throat:     Mouth: Mucous membranes are moist.     Pharynx: Posterior oropharyngeal erythema present.  Cardiovascular:     Rate and Rhythm: Normal rate and regular rhythm.  Pulmonary:     Effort: Pulmonary effort is normal.     Breath sounds: Normal breath sounds.  Musculoskeletal:     Cervical back: Normal range of motion and neck supple. No tenderness.  Lymphadenopathy:     Cervical: No cervical adenopathy.  Skin:    General: Skin is warm.  Neurological:     General: No focal deficit present.     Mental Status: She is alert.  Psychiatric:        Mood and Affect: Mood normal.      UC Treatments / Results  Labs (all labs ordered are listed, but only abnormal results are displayed) Labs Reviewed  CULTURE, GROUP A STREP ([facility])  POC COVID19/FLU A&B COMBO  POCT RAPID STREP A (OFFICE)    EKG   Radiology MM 3D DIAGNOSTIC MAMMOGRAM BILATERAL BREAST Result Date: 12/17/2023 CLINICAL DATA:  53 year old female  presenting for evaluation of a palpable lump in the left breast identified on clinical breast exam. EXAM: DIGITAL DIAGNOSTIC BILATERAL MAMMOGRAM WITH TOMOSYNTHESIS AND CAD; ULTRASOUND LEFT BREAST LIMITED TECHNIQUE: Bilateral digital diagnostic mammography and breast tomosynthesis was performed. The images were evaluated with computer-aided detection. ; Targeted ultrasound examination of the left breast was performed. COMPARISON:  Previous exam(s). ACR Breast Density Category c: The breasts are heterogeneously dense, which may obscure small masses. FINDINGS: No suspicious calcifications, masses or areas of distortion are seen in the bilateral breasts. Ultrasound targeted to the lateral aspect of the left breast demonstrates normal dense fibroglandular tissue. No suspicious masses or other sonographic abnormalities are identified. IMPRESSION: 1. There are no suspicious mammographic or targeted sonographic abnormalities at the palpable site of concern in the lateral left breast. 2.  No evidence of malignancy in the bilateral breasts. RECOMMENDATION: 1. Clinical follow-up recommended for the palpable area of concern in the left breast. Any further workup should be based on clinical grounds. 2.  Screening mammogram in one year.(Code:SM-B-01Y) I have discussed the findings and recommendations with the patient. If applicable, a reminder letter will be sent to the patient regarding the next appointment. BI-RADS CATEGORY  1: Negative. Electronically Signed   By: Frederico Hamman M.D.   On: 12/17/2023 08:49   [place] LIMITED ULTRASOUND INCLUDING AXILLA LEFT BREAST  Result Date: 12/17/2023 CLINICAL DATA:  53 year old female presenting for evaluation of a palpable lump in the left breast identified on clinical breast exam. EXAM: DIGITAL DIAGNOSTIC BILATERAL MAMMOGRAM WITH TOMOSYNTHESIS AND CAD; ULTRASOUND LEFT BREAST LIMITED TECHNIQUE: Bilateral digital diagnostic mammography and breast tomosynthesis was performed. The images  were evaluated with computer-aided detection. ; Targeted ultrasound examination of the left breast was performed. COMPARISON:  Previous exam(s). ACR Breast Density Category c: The breasts are heterogeneously dense, which may obscure small masses. FINDINGS: No suspicious calcifications, masses or areas of distortion are seen in the bilateral breasts. Ultrasound targeted to the lateral aspect of the left breast demonstrates normal dense fibroglandular tissue. No suspicious masses or other sonographic abnormalities are identified. IMPRESSION: 1. There are no suspicious mammographic or targeted sonographic abnormalities at the palpable site of concern in the lateral left breast. 2.  No evidence of malignancy in the bilateral breasts. RECOMMENDATION: 1. Clinical follow-up recommended for the palpable area of concern in the left breast. Any further workup should be based on clinical grounds. 2.  Screening mammogram in one year.(Code:SM-B-01Y) I have discussed the findings and recommendations with the patient. If applicable, a reminder letter will be sent to the patient regarding the next appointment. BI-RADS CATEGORY  1: Negative. Electronically Signed   By: Frederico Hamman M.D.   On: 12/17/2023 08:49    Procedures Procedures (including critical care time)  Medications Ordered in UC Medications - No data to display  Initial Impression / Assessment and Plan / UC Course  I have reviewed the triage vital signs and the nursing notes.  Pertinent labs & imaging results that were available during my care of the patient were reviewed by me and considered in my medical decision making (see chart for details).  Final Clinical Impressions(s) / UC Diagnoses   Final diagnoses:  Viral URI with cough  Acute pharyngitis, unspecified etiology     Discharge Instructions  You were seen today for upper respiratory symptoms.  Your strep test was negative, and will be sent for culture.  Your flu and covid test  were negative today.  Your symptoms are likely due to another virus.  I recommend you use tylenol and motrin, as well as salt water gargles.  Please get plenty of rest and increase fluids.  Return if you are not improving or worsening.     ED Prescriptions   None    PDMP not reviewed this encounter.   Jannifer Franklin, MD 12/17/23 [phone]

## 2023-12-17 NOTE — Discharge Instructions (Addendum)
 You were seen today for upper respiratory symptoms.  Your strep test was negative, and will be sent for culture.  Your flu and covid test were negative today.  Your symptoms are likely due to another virus.  I recommend you use tylenol and motrin, as well as salt water gargles.  Please get plenty of rest and increase fluids.  Return if you are not improving or worsening.

## 2023-12-17 NOTE — ED Triage Notes (Signed)
 Pt c/o sore throat x3 days, cough, headache, and chills since last night. No meds taken today.

## 2023-12-20 LAB — CULTURE, GROUP A STREP (THRC)

## 2023-12-23 ENCOUNTER — Ambulatory Visit: Payer: Self-pay | Admitting: General Practice

## 2023-12-23 NOTE — Telephone Encounter (Signed)
 Chief Complaint: Cough, ear congestion Symptoms: Cough, ear congestion Frequency: Cough x 1 week, ear congestion x 1 day Pertinent Negatives: Patient denies fever Disposition: [] ED /[] Urgent Care (no appt availability in office) / [] Appointment(In office/virtual)/ []  [facility]/ [x] Home Care/ [] Refused Recommended Disposition /[] [place] [facility]/ []  Follow-up with PCP Additional Notes: Patient called reporting ongoing cough after diagnosis of URI. Patient also states she has been having ear congestion for 1 day. Advised patient on home care instructions. Assisted patient in making new patient TOC appt.    Copied from CRM [id number]. Topic: Clinical - Red Word Triage >> Dec 23, 2023  2:11 PM Marlow Baars wrote: Red Word that prompted transfer to Nurse Triage: The patient called in stating she was a pt of Dr Oneta Rack but she is looking for a new primary care provider. She was seen recently at Urgent Care for an URI but has only gotten worse. She complains of shooting pain in her left ear and fluid retention as well as headache, cough and some sore throat. I will transfer her to [facility] Reason for Disposition  Ear congestion  Answer Assessment - Initial Assessment Questions 1. ONSET: "When did the cough begin?"      Last week 2. SEVERITY: "How bad is the cough today?"      Still having deep cough 3. SPUTUM: "Describe the color of your sputum" (none, dry cough; clear, white, yellow, green)     Unsure 4. HEMOPTYSIS: "Are you coughing up any blood?" If so ask: "How much?" (flecks, streaks, tablespoons, etc.)     No 5. DIFFICULTY BREATHING: "Are you having difficulty breathing?" If Yes, ask: "How bad is it?" (e.g., mild, moderate, severe)    - MILD: No SOB at rest, mild SOB with walking, speaks normally in sentences, can lie down, no retractions, pulse < 100.    - MODERATE: SOB at rest, SOB with minimal exertion and prefers to sit, cannot lie down flat, speaks in phrases, mild  retractions, audible wheezing, pulse 100-120.    - SEVERE: Very SOB at rest, speaks in single words, struggling to breathe, sitting hunched forward, retractions, pulse > 120      None 6. FEVER: "Do you have a fever?" If Yes, ask: "What is your temperature, how was it measured, and when did it start?"     No 7. CARDIAC HISTORY: "Do you have any history of heart disease?" (e.g., heart attack, congestive heart failure)      No 8. LUNG HISTORY: "Do you have any history of lung disease?"  (e.g., pulmonary embolus, asthma, emphysema)     No 9. PE RISK FACTORS: "Do you have a history of blood clots?" (or: recent major surgery, recent prolonged travel, bedridden)     No 10. OTHER SYMPTOMS: "Do you have any other symptoms?" (e.g., runny nose, wheezing, chest pain)       No  Answer Assessment - Initial Assessment Questions 1. LOCATION: "Which ear is involved?"       Left ear 2. SENSATION: "Describe how the ear feels." (e.g. stuffy, full, plugged)."      Ear feels plugged, feels like something is in it  3. ONSET:  "When did the ear symptoms start?"       Yesterady 4. PAIN: "Do you also have an earache?" If Yes, ask: "How bad is it?" (Scale 1-10; or mild, moderate, severe)     5 5. CAUSE: "What do you think is causing the ear congestion?"  URI  6. URI: "Do you have a runny nose or cough?"      Cough 7. NASAL ALLERGIES: "Are there symptoms of hay fever, such as sneezing or a clear nasal discharge?"     Nasal discharge  Protocols used: Cough - Acute Productive-A-AH, Ear - Congestion-A-AH

## 2024-02-02 ENCOUNTER — Encounter: Payer: Self-pay | Admitting: Family Medicine

## 2024-02-02 ENCOUNTER — Ambulatory Visit: Admitting: Family Medicine

## 2024-02-02 ENCOUNTER — Other Ambulatory Visit (HOSPITAL_COMMUNITY): Payer: Self-pay

## 2024-02-02 VITALS — BP 110/70 | HR 72 | Temp 98.4°F | Ht 63.5 in | Wt 145.8 lb

## 2024-02-02 DIAGNOSIS — B370 Candidal stomatitis: Secondary | ICD-10-CM | POA: Diagnosis not present

## 2024-02-02 DIAGNOSIS — E538 Deficiency of other specified B group vitamins: Secondary | ICD-10-CM | POA: Diagnosis not present

## 2024-02-02 DIAGNOSIS — Z79899 Other long term (current) drug therapy: Secondary | ICD-10-CM | POA: Diagnosis not present

## 2024-02-02 DIAGNOSIS — Z720 Tobacco use: Secondary | ICD-10-CM | POA: Diagnosis not present

## 2024-02-02 DIAGNOSIS — E039 Hypothyroidism, unspecified: Secondary | ICD-10-CM

## 2024-02-02 DIAGNOSIS — A6000 Herpesviral infection of urogenital system, unspecified: Secondary | ICD-10-CM

## 2024-02-02 DIAGNOSIS — E559 Vitamin D deficiency, unspecified: Secondary | ICD-10-CM | POA: Diagnosis not present

## 2024-02-02 LAB — CBC WITH DIFFERENTIAL/PLATELET
Basophils Absolute: 0 10*3/uL (ref 0.0–0.1)
Basophils Relative: 0.5 % (ref 0.0–3.0)
Eosinophils Absolute: 0.1 10*3/uL (ref 0.0–0.7)
Eosinophils Relative: 2 % (ref 0.0–5.0)
HCT: 36.7 % (ref 36.0–46.0)
Hemoglobin: 12.4 g/dL (ref 12.0–15.0)
Lymphocytes Relative: 40.6 % (ref 12.0–46.0)
Lymphs Abs: 2.7 10*3/uL (ref 0.7–4.0)
MCHC: 33.7 g/dL (ref 30.0–36.0)
MCV: 90.2 fl (ref 78.0–100.0)
Monocytes Absolute: 0.5 10*3/uL (ref 0.1–1.0)
Monocytes Relative: 8.1 % (ref 3.0–12.0)
Neutro Abs: 3.2 10*3/uL (ref 1.4–7.7)
Neutrophils Relative %: 48.8 % (ref 43.0–77.0)
Platelets: 183 10*3/uL (ref 150.0–400.0)
RBC: 4.07 Mil/uL (ref 3.87–5.11)
RDW: 12.9 % (ref 11.5–15.5)
WBC: 6.6 10*3/uL (ref 4.0–10.5)

## 2024-02-02 LAB — COMPREHENSIVE METABOLIC PANEL WITH GFR
ALT: 16 U/L (ref 0–35)
AST: 19 U/L (ref 0–37)
Albumin: 4.1 g/dL (ref 3.5–5.2)
Alkaline Phosphatase: 57 U/L (ref 39–117)
BUN: 14 mg/dL (ref 6–23)
CO2: 32 meq/L (ref 19–32)
Calcium: 9.3 mg/dL (ref 8.4–10.5)
Chloride: 103 meq/L (ref 96–112)
Creatinine, Ser: 0.7 mg/dL (ref 0.40–1.20)
GFR: 99.68 mL/min (ref >60.00)
Glucose, Bld: 94 mg/dL (ref 70–99)
Potassium: 3.9 meq/L (ref 3.5–5.1)
Sodium: 139 meq/L (ref 135–145)
Total Bilirubin: 0.3 mg/dL (ref 0.2–1.2)
Total Protein: 6.9 g/dL (ref 6.0–8.3)

## 2024-02-02 LAB — VITAMIN B12: Vitamin B-12: 294 pg/mL (ref 211–911)

## 2024-02-02 LAB — VITAMIN D 25 HYDROXY (VIT D DEFICIENCY, FRACTURES): VITD: 21.48 ng/mL — ABNORMAL LOW (ref 30.00–100.00)

## 2024-02-02 LAB — TSH: TSH: 3.44 u[IU]/mL (ref 0.35–5.50)

## 2024-02-02 MED ORDER — NYSTATIN 100000 UNIT/ML MT SUSP
10.0000 mL | Freq: Four times a day (QID) | OROMUCOSAL | 1 refills | Status: DC | PRN
  Filled 2024-02-02: qty 180, 5d supply, fill #0

## 2024-02-02 NOTE — Assessment & Plan Note (Signed)
 Magic mouthwash to pharmacy

## 2024-02-02 NOTE — Assessment & Plan Note (Signed)
 Taking 45 mg NP thyroid , will check TSH today

## 2024-02-02 NOTE — Assessment & Plan Note (Signed)
 Vitamin D today

## 2024-02-02 NOTE — Assessment & Plan Note (Signed)
 Continue current med regimen, will dose adjust as needed based on labs when they result

## 2024-02-02 NOTE — Progress Notes (Signed)
 New Patient Office Visit  Subjective    Patient ID: Carmen Hoffman, female    DOB: 08/21/1971  Age: 53 y.o. MRN: [id number]  CC:  Chief Complaint  Patient presents with   Establish Care    HPI [name] presents to establish care today. She sees Lovell Rubenstein with [facility] for gynecological needs. She sees Technical sales engineer for endocrinology and management of hypothyroidism. Reports fatigue, would like thyroid  checked. Will not see endo until next year. Up to date on routine vaccines. Up to date on routine screenings.  Receives regular dental and eye care.   Reports weight coating to the tongue that comes and goes.  States sometimes she has a sore throat when her tongue is having a flareup and the coating is worse.  States that she cleans and scraped her tongue to help treat.  States that it usually still comes back. Denies recent antibiotic use, does state that she drinks sugary drinks and likes sugary foods. Reports compliance with medication regimen.  Denies other concerns today.  Outpatient Encounter Medications as of 02/02/2024  Medication Sig   magic mouthwash (nystatin , lidocaine , diphenhydrAMINE , alum & mag hydroxide) suspension Swish and spit 10 mLs 4 (four) times daily as needed for mouth pain.   thyroid  (NP THYROID ) 15 MG tablet Take 1 tablet (15 mg total) by mouth daily.   thyroid  (NP THYROID ) 30 MG tablet Take 1 tablet (30 mg total) by mouth daily on an empty stomach   valACYclovir  (VALTREX ) 500 MG tablet Take 1 tablet (500 mg total) by mouth daily FOR SUPPRESSION. TAKE TWICE DAILY FOR 3 DAYS FOR OUTBREAK.   [DISCONTINUED] thyroid  (NP THYROID ) 15 MG tablet Take 1 tablet (15 mg total) by mouth daily on an empty stomach (Patient not taking: Reported on 02/02/2024)   [DISCONTINUED] thyroid  (NP THYROID ) 30 MG tablet Take 1 tablet (30 mg total) by mouth daily. (Patient not taking: Reported on 02/02/2024)   No facility-administered encounter  medications on file as of 02/02/2024.    Past Medical History:  Diagnosis Date   ANEMIA-IRON DEFICIENCY 05/30/2007   Anxiety    Aphthous stomatitis    Arthritis    Cellulitis and abscess of buttock 07/12/2013   10/14 L    Depression    Genital herpes    GERD (gastroesophageal reflux disease)    History of hemorrhoids 06/02/2018   Migraines    Occipital neuralgia 03/27/2013   6/14 R 8/15 Left    Throat pain 07/26/2008   2015 s/p ENT eval     Von Willebrand disease (HCC) 1992    Past Surgical History:  Procedure Laterality Date   COLONOSCOPY     ESOPHAGOGASTRODUODENOSCOPY     NOSE SURGERY  1991   injury to nose and the doctor had to repair the vessel   TUBAL LIGATION Bilateral 1988    Family History  Problem Relation Age of Onset   Fibromyalgia Mother    Hypertension Mother    Pancreatic cancer Mother 67   Hypertension Father    Bladder Cancer Father        33s, smoker   COPD Father    Arthritis Brother        rheumatoid   Healthy Daughter    Healthy Son    Cancer Paternal Uncle        ? Leukemia   Heart disease Maternal Grandfather    Breast cancer Cousin    Colon cancer Paternal Aunt    Thyroid  disease  Brother        Hyperthyroidism   Colon polyps Neg Hx    Rectal cancer Neg Hx    Stomach cancer Neg Hx    Esophageal cancer Neg Hx     Social History   Socioeconomic History   Marital status: Married    Spouse name: Not on file   Number of children: Not on file   Years of education: Not on file   Highest education level: Not on file  Occupational History   Not on file  Tobacco Use   Smoking status: Every Day    Current packs/day: 0.00    Average packs/day: 0.5 packs/day for 33.0 years (16.5 ttl pk-yrs)    Types: Cigarettes    Start date: 46    Last attempt to quit: 05/24/2016    Years since quitting: 7.6   Smokeless tobacco: Never   Tobacco comments:    stopped 2017, started back 2021  Vaping Use   Vaping status: Never Used  Substance and  Sexual Activity   Alcohol use: No   Drug use: No   Sexual activity: Yes    Partners: Male    Birth control/protection: Surgical  Other Topics Concern   Not on file  Social History Narrative   Not on file   Social Drivers of Health   Financial Resource Strain: Not on file  Food Insecurity: Not on file  Transportation Needs: Not on file  Physical Activity: Not on file  Stress: Not on file  Social Connections: Not on file  Intimate Partner Violence: Not on file    ROS Per HPI      Objective    BP 110/70 (BP Location: Left Arm, Patient Position: Sitting)   Pulse 72   Temp 98.4 F (36.9 C) (Temporal)   Ht 5' 3.5" (1.613 m)   Wt 145 lb 12.8 oz (66.1 kg)   SpO2 99%   BMI 25.42 kg/m   Physical Exam Vitals and nursing note reviewed.  Constitutional:      General: She is not in acute distress.    Appearance: Normal appearance.  HENT:     Head: Normocephalic and atraumatic.     Right Ear: External ear normal.     Left Ear: External ear normal.     Nose: Nose normal.     Mouth/Throat:     Mouth: Mucous membranes are moist.     Pharynx: Oropharynx is clear.     Comments: Thick white coating on tongue, consistent with thrush Eyes:     Extraocular Movements: Extraocular movements intact.     Pupils: Pupils are equal, round, and reactive to light.  Neck:     Thyroid : No thyroid  mass or thyromegaly.  Cardiovascular:     Rate and Rhythm: Normal rate and regular rhythm.     Pulses: Normal pulses.     Heart sounds: Normal heart sounds. No murmur heard. Pulmonary:     Effort: Pulmonary effort is normal. No respiratory distress.     Breath sounds: Normal breath sounds. No wheezing, rhonchi or rales.  Musculoskeletal:        General: Normal range of motion.     Cervical back: Normal range of motion.     Right lower leg: No edema.     Left lower leg: No edema.  Lymphadenopathy:     Cervical: No cervical adenopathy.  Neurological:     General: No focal deficit  present.     Mental Status: She is alert and  oriented to person, place, and time.  Psychiatric:        Mood and Affect: Mood normal.        Thought Content: Thought content normal.        Assessment & Plan:   Acquired hypothyroidism Assessment & Plan: Taking 45 mg NP thyroid , will check TSH today  Orders: -     TSH  Tobacco abuse disorder Assessment & Plan: Chronic, refractory  Orders: -     CBC with Differential/Platelet -     Comprehensive metabolic panel with GFR  Genital herpes simplex, unspecified site Assessment & Plan: Controlled, continue Valtrex   Orders: -     Comprehensive metabolic panel with GFR  Vitamin D  deficiency Assessment & Plan: Vitamin D  today  Orders: -     VITAMIN D  25 Hydroxy (Vit-D Deficiency, Fractures)  B12 deficiency Assessment & Plan: Will check levels today  Orders: -     Vitamin B12  Thrush Assessment & Plan: Magic mouthwash to pharmacy  Orders: -     magic mouthwash (nystatin , lidocaine , diphenhydrAMINE , alum & mag hydroxide) suspension; Swish and spit 10 mLs 4 (four) times daily as needed for mouth pain.  Dispense: 180 mL; Refill: 1  Medication management Assessment & Plan: Continue current med regimen, will dose adjust as needed based on labs when they result  Orders: -     magic mouthwash (nystatin , lidocaine , diphenhydrAMINE , alum & mag hydroxide) suspension; Swish and spit 10 mLs 4 (four) times daily as needed for mouth pain.  Dispense: 180 mL; Refill: 1 -     CBC with Differential/Platelet -     Comprehensive metabolic panel with GFR -     TSH -     Vitamin B12 -     VITAMIN D  25 Hydroxy (Vit-D Deficiency, Fractures)     Return for July/August for CPE.   Wellington Half, FNP

## 2024-02-02 NOTE — Assessment & Plan Note (Signed)
Will check levels today

## 2024-02-02 NOTE — Patient Instructions (Addendum)
 Welcome to [facility]!  Thank you for choosing us  for your Primary Care needs.   We offer in person and video appointments for your convenience. You may call our office to schedule appointments, or you may schedule appointments with me through MyChart.   The best way to get in contact with me is via MyChart message. This will get to me faster than a phone call, unless there is an emergency, then please call 911.  The lab is located downstairs in the Sports Medicine building, we also have xray available there.   I have sent in Magic Mouthwash for you to swish and spit 10mL every 6 hours as needed.  We are checking labs today, will be in contact with any results that require further attention

## 2024-02-02 NOTE — Assessment & Plan Note (Signed)
Chronic, refractory 

## 2024-02-02 NOTE — Assessment & Plan Note (Signed)
 Controlled, continue Valtrex 

## 2024-02-03 ENCOUNTER — Other Ambulatory Visit (HOSPITAL_COMMUNITY): Payer: Self-pay

## 2024-02-15 DIAGNOSIS — L814 Other melanin hyperpigmentation: Secondary | ICD-10-CM | POA: Diagnosis not present

## 2024-02-15 DIAGNOSIS — L821 Other seborrheic keratosis: Secondary | ICD-10-CM | POA: Diagnosis not present

## 2024-02-28 ENCOUNTER — Encounter: Payer: Commercial Managed Care - PPO | Admitting: Nurse Practitioner

## 2024-05-01 ENCOUNTER — Other Ambulatory Visit (HOSPITAL_COMMUNITY): Payer: Self-pay

## 2024-05-02 ENCOUNTER — Other Ambulatory Visit (HOSPITAL_COMMUNITY): Payer: Self-pay

## 2024-05-02 MED ORDER — VALACYCLOVIR HCL 500 MG PO TABS
500.0000 mg | ORAL_TABLET | Freq: Two times a day (BID) | ORAL | 2 refills | Status: AC
  Filled 2024-05-02: qty 60, 30d supply, fill #0

## 2024-05-03 ENCOUNTER — Encounter: Admitting: Family Medicine

## 2024-05-04 ENCOUNTER — Ambulatory Visit: Admitting: Family Medicine

## 2024-05-04 ENCOUNTER — Other Ambulatory Visit (HOSPITAL_COMMUNITY): Payer: Self-pay

## 2024-05-04 ENCOUNTER — Encounter: Payer: Self-pay | Admitting: Family Medicine

## 2024-05-04 ENCOUNTER — Ambulatory Visit: Payer: Self-pay | Admitting: Family Medicine

## 2024-05-04 VITALS — BP 102/64 | HR 64 | Temp 97.6°F | Ht 63.5 in | Wt 145.0 lb

## 2024-05-04 DIAGNOSIS — Z0000 Encounter for general adult medical examination without abnormal findings: Secondary | ICD-10-CM | POA: Diagnosis not present

## 2024-05-04 DIAGNOSIS — B9689 Other specified bacterial agents as the cause of diseases classified elsewhere: Secondary | ICD-10-CM | POA: Insufficient documentation

## 2024-05-04 DIAGNOSIS — Z720 Tobacco use: Secondary | ICD-10-CM | POA: Diagnosis not present

## 2024-05-04 DIAGNOSIS — A6000 Herpesviral infection of urogenital system, unspecified: Secondary | ICD-10-CM

## 2024-05-04 DIAGNOSIS — E559 Vitamin D deficiency, unspecified: Secondary | ICD-10-CM

## 2024-05-04 DIAGNOSIS — K146 Glossodynia: Secondary | ICD-10-CM | POA: Diagnosis not present

## 2024-05-04 DIAGNOSIS — N898 Other specified noninflammatory disorders of vagina: Secondary | ICD-10-CM | POA: Insufficient documentation

## 2024-05-04 DIAGNOSIS — J449 Chronic obstructive pulmonary disease, unspecified: Secondary | ICD-10-CM

## 2024-05-04 DIAGNOSIS — Z136 Encounter for screening for cardiovascular disorders: Secondary | ICD-10-CM | POA: Diagnosis not present

## 2024-05-04 DIAGNOSIS — E88819 Insulin resistance, unspecified: Secondary | ICD-10-CM | POA: Insufficient documentation

## 2024-05-04 DIAGNOSIS — K2100 Gastro-esophageal reflux disease with esophagitis, without bleeding: Secondary | ICD-10-CM

## 2024-05-04 DIAGNOSIS — F3341 Major depressive disorder, recurrent, in partial remission: Secondary | ICD-10-CM | POA: Diagnosis not present

## 2024-05-04 DIAGNOSIS — E039 Hypothyroidism, unspecified: Secondary | ICD-10-CM | POA: Diagnosis not present

## 2024-05-04 DIAGNOSIS — Z2821 Immunization not carried out because of patient refusal: Secondary | ICD-10-CM

## 2024-05-04 LAB — VITAMIN D 25 HYDROXY (VIT D DEFICIENCY, FRACTURES): VITD: 37.04 ng/mL (ref 30.00–100.00)

## 2024-05-04 LAB — COMPREHENSIVE METABOLIC PANEL WITH GFR
ALT: 13 U/L (ref 0–35)
AST: 17 U/L (ref 0–37)
Albumin: 4.3 g/dL (ref 3.5–5.2)
Alkaline Phosphatase: 69 U/L (ref 39–117)
BUN: 17 mg/dL (ref 6–23)
CO2: 29 meq/L (ref 19–32)
Calcium: 9.1 mg/dL (ref 8.4–10.5)
Chloride: 102 meq/L (ref 96–112)
Creatinine, Ser: 0.63 mg/dL (ref 0.40–1.20)
GFR: 102.06 mL/min (ref >60.00)
Glucose, Bld: 84 mg/dL (ref 70–99)
Potassium: 4.1 meq/L (ref 3.5–5.1)
Sodium: 137 meq/L (ref 135–145)
Total Bilirubin: 0.3 mg/dL (ref 0.2–1.2)
Total Protein: 7.2 g/dL (ref 6.0–8.3)

## 2024-05-04 LAB — CBC WITH DIFFERENTIAL/PLATELET
Basophils Absolute: 0 K/uL (ref 0.0–0.1)
Basophils Relative: 0.7 % (ref 0.0–3.0)
Eosinophils Absolute: 0.1 K/uL (ref 0.0–0.7)
Eosinophils Relative: 1.9 % (ref 0.0–5.0)
HCT: 37.5 % (ref 36.0–46.0)
Hemoglobin: 12.7 g/dL (ref 12.0–15.0)
Lymphocytes Relative: 34.4 % (ref 12.0–46.0)
Lymphs Abs: 2.2 K/uL (ref 0.7–4.0)
MCHC: 33.7 g/dL (ref 30.0–36.0)
MCV: 90 fl (ref 78.0–100.0)
Monocytes Absolute: 0.5 K/uL (ref 0.1–1.0)
Monocytes Relative: 7.3 % (ref 3.0–12.0)
Neutro Abs: 3.5 K/uL (ref 1.4–7.7)
Neutrophils Relative %: 55.7 % (ref 43.0–77.0)
Platelets: 197 K/uL (ref 150.0–400.0)
RBC: 4.17 Mil/uL (ref 3.87–5.11)
RDW: 13.7 % (ref 11.5–15.5)
WBC: 6.3 K/uL (ref 4.0–10.5)

## 2024-05-04 LAB — LIPID PANEL
Cholesterol: 165 mg/dL (ref 0–200)
HDL: 46.6 mg/dL (ref >39.00)
LDL Cholesterol: 95 mg/dL (ref 0–99)
NonHDL: 118.14
Total CHOL/HDL Ratio: 4
Triglycerides: 118 mg/dL (ref 0.0–149.0)
VLDL: 23.6 mg/dL (ref 0.0–40.0)

## 2024-05-04 LAB — TSH: TSH: 3.02 u[IU]/mL (ref 0.35–5.50)

## 2024-05-04 MED ORDER — NYSTATIN 100000 UNIT/ML MT SUSP
5.0000 mL | Freq: Four times a day (QID) | OROMUCOSAL | 0 refills | Status: DC
  Filled 2024-05-04: qty 60, 3d supply, fill #0

## 2024-05-04 NOTE — Progress Notes (Signed)
 Annual Physical Exam  Subjective:     Patient ID: Carmen Hoffman, female    DOB: March 20, 1971, 53 y.o.   MRN: [id number]  Chief Complaint  Patient presents with   Annual Exam    Rm 8. fasting    HPI  Discussed the use of AI scribe software for clinical note transcription with the patient, who gave verbal consent to proceed.  History of Present Illness Carmen Hoffman is a 53 year old female who presents for physical today, and  persistent oral discomfort.  Oral discomfort - Persistent oral discomfort localized to the tongue - Tongue is painful and cracks - Magic mouthwash has provided no relief - No trial of plain nystatin  mouth rinse - Home remedies including Listerine, warm salt water rinses, and reducing sugar intake have been ineffective - Discomfort recurs a few hours after scrubbing the tongue - No throat pain  Constitutional symptoms - No significant changes in appetite  Tobacco use - Smokes approximately one pack per week - Recently reduced tobacco consumption  Occupational factors - Works third shift     ROS Per HPI  Most recent fall risk assessment:    05/04/2024    9:00 AM  Fall Risk   Falls in the past year? 0  Number falls in past yr: 0  Injury with Fall? 0  Risk for fall due to : No Fall Risks  Follow up Falls evaluation completed    Most recent depression screenings:    05/04/2024    9:00 AM 02/20/2021    2:18 PM  PHQ 2/9 Scores  PHQ - 2 Score 0 0  PHQ- 9 Score 0 0    Vision:Within last year and Dental: Receives regular dental care  Patient Care Team: Alvia Corean CROME, FNP as PCP - General (Family Medicine) Meisinger, Krystal, MD as Attending Physician (Obstetrics and Gynecology)   Outpatient Medications Prior to Visit  Medication Sig   thyroid  (NP THYROID ) 15 MG tablet Take 1 tablet (15 mg total) by mouth daily.   thyroid  (NP THYROID ) 30 MG tablet Take 1 tablet (30 mg total) by mouth daily on an empty stomach    valACYclovir  (VALTREX ) 500 MG tablet Take 1 tablet (500 mg total) by mouth daily for suppression. Take 1 tablet (500 mg total) twice daily for 3 days for outbreak.   VITAMIN D  PO Take by mouth.   [DISCONTINUED] magic mouthwash (nystatin , diphenhydrAMINE , alum & mag hydroxide) suspension mixture Swish and spit 10 mLs by mouth 4 (four) times daily as needed for mouth pain. (Patient not taking: Reported on 05/04/2024)   No facility-administered medications prior to visit.       Objective:    BP 102/64 (BP Location: Left Arm, Patient Position: Sitting)   Pulse 64   Temp 97.6 F (36.4 C) (Temporal)   Ht 5' 3.5 (1.613 m)   Wt 145 lb (65.8 kg)   SpO2 99%   BMI 25.28 kg/m    Physical Exam Vitals and nursing note reviewed.  Constitutional:      General: She is not in acute distress.    Appearance: Normal appearance. She is normal weight.  HENT:     Head: Normocephalic and atraumatic.     Right Ear: External ear normal.     Left Ear: External ear normal.     Nose: Nose normal.     Mouth/Throat:     Mouth: Mucous membranes are moist.     Pharynx: Oropharynx is clear.  Eyes:  Extraocular Movements: Extraocular movements intact.     Pupils: Pupils are equal, round, and reactive to light.  Cardiovascular:     Rate and Rhythm: Normal rate and regular rhythm.     Pulses: Normal pulses.     Heart sounds: Normal heart sounds.  Pulmonary:     Effort: Pulmonary effort is normal. No respiratory distress.     Breath sounds: Normal breath sounds. No wheezing, rhonchi or rales.  Musculoskeletal:        General: Normal range of motion.     Cervical back: Normal range of motion.     Right lower leg: No edema.     Left lower leg: No edema.  Lymphadenopathy:     Cervical: No cervical adenopathy.  Neurological:     General: No focal deficit present.     Mental Status: She is alert and oriented to person, place, and time.  Psychiatric:        Mood and Affect: Mood normal.        Thought  Content: Thought content normal.     Results for orders placed or performed in visit on 05/04/24  CBC with Differential/Platelet  Result Value Ref Range   WBC 6.3 4.0 - 10.5 K/uL   RBC 4.17 3.87 - 5.11 Mil/uL   Hemoglobin 12.7 12.0 - 15.0 g/dL   HCT 62.4 63.9 - 53.9 %   MCV 90.0 78.0 - 100.0 fl   MCHC 33.7 30.0 - 36.0 g/dL   RDW 86.2 88.4 - 84.4 %   Platelets 197.0 150.0 - 400.0 K/uL   Neutrophils Relative % 55.7 43.0 - 77.0 %   Lymphocytes Relative 34.4 12.0 - 46.0 %   Monocytes Relative 7.3 3.0 - 12.0 %   Eosinophils Relative 1.9 0.0 - 5.0 %   Basophils Relative 0.7 0.0 - 3.0 %   Neutro Abs 3.5 1.4 - 7.7 K/uL   Lymphs Abs 2.2 0.7 - 4.0 K/uL   Monocytes Absolute 0.5 0.1 - 1.0 K/uL   Eosinophils Absolute 0.1 0.0 - 0.7 K/uL   Basophils Absolute 0.0 0.0 - 0.1 K/uL  Comprehensive metabolic panel with GFR  Result Value Ref Range   Sodium 137 135 - 145 mEq/L   Potassium 4.1 3.5 - 5.1 mEq/L   Chloride 102 96 - 112 mEq/L   CO2 29 19 - 32 mEq/L   Glucose, Bld 84 70 - 99 mg/dL   BUN 17 6 - 23 mg/dL   Creatinine, Ser 9.36 0.40 - 1.20 mg/dL   Total Bilirubin 0.3 0.2 - 1.2 mg/dL   Alkaline Phosphatase 69 39 - 117 U/L   AST 17 0 - 37 U/L   ALT 13 0 - 35 U/L   Total Protein 7.2 6.0 - 8.3 g/dL   Albumin 4.3 3.5 - 5.2 g/dL   GFR 897.93 >39.99 mL/min   Calcium 9.1 8.4 - 10.5 mg/dL  Lipid panel  Result Value Ref Range   Cholesterol 165 0 - 200 mg/dL   Triglycerides 881.9 0.0 - 149.0 mg/dL   HDL 53.39 >60.99 mg/dL   VLDL 76.3 0.0 - 59.9 mg/dL   LDL Cholesterol 95 0 - 99 mg/dL   Total CHOL/HDL Ratio 4    NonHDL 118.14   TSH  Result Value Ref Range   TSH 3.02 0.35 - 5.50 uIU/mL  VITAMIN D  25 Hydroxy (Vit-D Deficiency, Fractures)  Result Value Ref Range   VITD 37.04 30.00 - 100.00 ng/mL    BP Readings from Last 3 Encounters:  05/04/24  102/64  02/02/24 110/70  12/17/23 122/79   Wt Readings from Last 3 Encounters:  05/04/24 145 lb (65.8 kg)  02/02/24 145 lb 12.8 oz (66.1 kg)   09/01/23 140 lb 9.6 oz (63.8 kg)      Last CBC Lab Results  Component Value Date   WBC 6.3 05/04/2024   HGB 12.7 05/04/2024   HCT 37.5 05/04/2024   MCV 90.0 05/04/2024   MCH 30.0 09/01/2023   RDW 13.7 05/04/2024   PLT 197.0 05/04/2024   Last metabolic panel Lab Results  Component Value Date   GLUCOSE 84 05/04/2024   NA 137 05/04/2024   K 4.1 05/04/2024   CL 102 05/04/2024   CO2 29 05/04/2024   BUN 17 05/04/2024   CREATININE 0.63 05/04/2024   GFR 102.06 05/04/2024   CALCIUM 9.1 05/04/2024   PROT 7.2 05/04/2024   ALBUMIN 4.3 05/04/2024   BILITOT 0.3 05/04/2024   ALKPHOS 69 05/04/2024   AST 17 05/04/2024   ALT 13 05/04/2024   ANIONGAP 6 02/15/2023         Assessment & Plan:   Assessment and Plan Assessment & Plan Oral thrush/tongue pain Persistent oral thrush with tongue involvement, unresponsive to treatments. ENT evaluation needed. - Prescribe plain nystatin  mouth rinse. - Refer to ENT for evaluation and management. - Expedite ENT referral. - Consider alternative ENT options if delays occur.  Tobacco use disorder Continues to smoke, reduced to a pack a week. Smoking may affect oral health. - Discussed continuing to decrease the amount of cigarettes she is smoking - She is not ready to completely quit today - Smoking cessation handout attached to AVS - 4 minutes of visit dedicated to smoking cessation discussion  COPD - Stable  Hypothyroidism - TSH today  HSV - Continue Valtrex  as needed - Stable  MDD - Stable, continue current meds  Vitamin D  deficiency - Vitamin D  levels today  Cardiovascular screening - Lipids today  General Health Maintenance Eligible for shingles, pneumonia, and hepatitis B vaccines. Vaccination deferred due to oral health issue. - Check vaccination registry for hepatitis B vaccine status. - Discuss and potentially administer pneumonia vaccine later due to smoking history and age. - Defer shingles vaccine until oral  health issue is resolved.  Follow-up Requires follow-up for ongoing medical issues and general health maintenance. - Schedule follow-up in six months for labs and medication check, including thyroid  function. - Ensure ENT referral is processed and follow-up on appointment scheduling.     Health Maintenance  Topic Date Due   COVID-19 Vaccine (1) 05/20/2024*   Zoster (Shingles) Vaccine (1 of 2) 08/04/2024*   Pneumococcal Vaccination (1 of 2 - PCV) 05/04/2025*   Hepatitis B Vaccine (1 of 3 - 19+ 3-dose series) 05/04/2025*   Flu Shot  05/12/2024   Mammogram  12/16/2025   Colon Cancer Screening  03/23/2026   Pap with HPV screening  07/15/2026   DTaP/Tdap/Td vaccine (2 - Td or Tdap) 02/21/2031   HPV Vaccine  Aged Out   Meningitis B Vaccine  Aged Out   Hepatitis C Screening  Discontinued   HIV Screening  Discontinued  *Topic was postponed. The date shown is not the original due date.     Discussed health benefits of physical activity, and encouraged her to engage in regular exercise appropriate for her age and condition.  Orders Placed This Encounter  Procedures   CBC with Differential/Platelet    Release to patient:   Immediate [1]   Comprehensive metabolic panel with  GFR    Release to patient:   Immediate [1]   Lipid panel   TSH   VITAMIN D  25 Hydroxy (Vit-D Deficiency, Fractures)   Ambulatory referral to ENT    Referral Priority:   Emergency    Referral Type:   Consultation    Referral Reason:   Specialty Services Required    Requested Specialty:   Otolaryngology    Number of Visits Requested:   1     Meds ordered this encounter  Medications   nystatin  (MYCOSTATIN ) 100000 UNIT/ML suspension    Sig: Take 5 mLs (500,000 Units total) by mouth 4 (four) times daily.    Dispense:  60 mL    Refill:  0    Return in about 6 months (around 11/04/2024) for med check.  Corean LITTIE Ku, FNP

## 2024-05-12 DIAGNOSIS — Z720 Tobacco use: Secondary | ICD-10-CM | POA: Diagnosis not present

## 2024-05-12 DIAGNOSIS — K148 Other diseases of tongue: Secondary | ICD-10-CM | POA: Diagnosis not present

## 2024-08-17 ENCOUNTER — Ambulatory Visit: Admitting: Family Medicine

## 2024-08-17 NOTE — Progress Notes (Deleted)
   Acute Office Visit  Subjective:     Patient ID: Carmen Hoffman, female    DOB: 1971/10/08, 53 y.o.   MRN: [id number]  No chief complaint on file.   HPI  Discussed the use of AI scribe software for clinical note transcription with the patient, who gave verbal consent to proceed.  History of Present Illness      ROS Per HPI      Objective:    There were no vitals taken for this visit.   Physical Exam Vitals and nursing note reviewed.  Constitutional:      General: She is not in acute distress.    Appearance: Normal appearance. She is normal weight.  HENT:     Head: Normocephalic and atraumatic.     Right Ear: External ear normal.     Left Ear: External ear normal.     Nose: Nose normal.     Mouth/Throat:     Mouth: Mucous membranes are moist.     Pharynx: Oropharynx is clear.  Eyes:     Extraocular Movements: Extraocular movements intact.     Pupils: Pupils are equal, round, and reactive to light.  Cardiovascular:     Rate and Rhythm: Normal rate and regular rhythm.     Pulses: Normal pulses.     Heart sounds: Normal heart sounds.  Pulmonary:     Effort: Pulmonary effort is normal. No respiratory distress.     Breath sounds: Normal breath sounds. No wheezing, rhonchi or rales.  Musculoskeletal:        General: Normal range of motion.     Cervical back: Normal range of motion.     Right lower leg: No edema.     Left lower leg: No edema.  Lymphadenopathy:     Cervical: No cervical adenopathy.  Neurological:     General: No focal deficit present.     Mental Status: She is alert and oriented to person, place, and time.  Psychiatric:        Mood and Affect: Mood normal.        Thought Content: Thought content normal.     No results found for any visits on 08/17/24.      Assessment & Plan:   Assessment and Plan Assessment & Plan      No orders of the defined types were placed in this encounter.    No orders of the defined types were  placed in this encounter.   No follow-ups on file.  Corean LITTIE Ku, FNP

## 2024-08-22 ENCOUNTER — Other Ambulatory Visit (HOSPITAL_COMMUNITY): Payer: Self-pay

## 2024-08-22 ENCOUNTER — Ambulatory Visit (INDEPENDENT_AMBULATORY_CARE_PROVIDER_SITE_OTHER): Admitting: Family Medicine

## 2024-08-22 ENCOUNTER — Ambulatory Visit (INDEPENDENT_AMBULATORY_CARE_PROVIDER_SITE_OTHER)

## 2024-08-22 ENCOUNTER — Encounter: Payer: Self-pay | Admitting: Family Medicine

## 2024-08-22 ENCOUNTER — Ambulatory Visit: Payer: Self-pay | Admitting: Family Medicine

## 2024-08-22 VITALS — BP 122/74 | HR 82 | Temp 98.1°F | Ht 63.5 in | Wt 143.4 lb

## 2024-08-22 DIAGNOSIS — N951 Menopausal and female climacteric states: Secondary | ICD-10-CM | POA: Diagnosis not present

## 2024-08-22 DIAGNOSIS — M79671 Pain in right foot: Secondary | ICD-10-CM | POA: Diagnosis not present

## 2024-08-22 DIAGNOSIS — E039 Hypothyroidism, unspecified: Secondary | ICD-10-CM

## 2024-08-22 DIAGNOSIS — H9202 Otalgia, left ear: Secondary | ICD-10-CM

## 2024-08-22 DIAGNOSIS — R42 Dizziness and giddiness: Secondary | ICD-10-CM

## 2024-08-22 LAB — COMPREHENSIVE METABOLIC PANEL WITH GFR
ALT: 13 U/L (ref 0–35)
AST: 17 U/L (ref 0–37)
Albumin: 4.3 g/dL (ref 3.5–5.2)
Alkaline Phosphatase: 60 U/L (ref 39–117)
BUN: 15 mg/dL (ref 6–23)
CO2: 31 meq/L (ref 19–32)
Calcium: 9.1 mg/dL (ref 8.4–10.5)
Chloride: 101 meq/L (ref 96–112)
Creatinine, Ser: 0.68 mg/dL (ref 0.40–1.20)
GFR: 99.99 mL/min (ref >60.00)
Glucose, Bld: 107 mg/dL — ABNORMAL HIGH (ref 70–99)
Potassium: 3.6 meq/L (ref 3.5–5.1)
Sodium: 139 meq/L (ref 135–145)
Total Bilirubin: 0.3 mg/dL (ref 0.2–1.2)
Total Protein: 7.1 g/dL (ref 6.0–8.3)

## 2024-08-22 LAB — TSH: TSH: 2.93 u[IU]/mL (ref 0.35–5.50)

## 2024-08-22 LAB — CBC WITH DIFFERENTIAL/PLATELET
Basophils Absolute: 0 K/uL (ref 0.0–0.1)
Basophils Relative: 0.4 % (ref 0.0–3.0)
Eosinophils Absolute: 0.1 K/uL (ref 0.0–0.7)
Eosinophils Relative: 1.6 % (ref 0.0–5.0)
HCT: 36.6 % (ref 36.0–46.0)
Hemoglobin: 12.6 g/dL (ref 12.0–15.0)
Lymphocytes Relative: 32.5 % (ref 12.0–46.0)
Lymphs Abs: 2.8 K/uL (ref 0.7–4.0)
MCHC: 34.5 g/dL (ref 30.0–36.0)
MCV: 88 fl (ref 78.0–100.0)
Monocytes Absolute: 0.6 K/uL (ref 0.1–1.0)
Monocytes Relative: 6.7 % (ref 3.0–12.0)
Neutro Abs: 5.1 K/uL (ref 1.4–7.7)
Neutrophils Relative %: 58.8 % (ref 43.0–77.0)
Platelets: 197 K/uL (ref 150.0–400.0)
RBC: 4.16 Mil/uL (ref 3.87–5.11)
RDW: 13.1 % (ref 11.5–15.5)
WBC: 8.6 K/uL (ref 4.0–10.5)

## 2024-08-22 MED ORDER — CLONIDINE HCL 0.1 MG PO TABS
0.1000 mg | ORAL_TABLET | Freq: Two times a day (BID) | ORAL | 3 refills | Status: AC | PRN
  Filled 2024-08-22: qty 30, 15d supply, fill #0

## 2024-08-22 NOTE — Progress Notes (Signed)
 Acute Office Visit  Subjective:     Patient ID: Carmen Hoffman, female    DOB: Sep 18, 1971, 53 y.o.   MRN: [id number]  Chief Complaint  Patient presents with   Acute Visit    HPI  Discussed the use of AI scribe software for clinical note transcription with the patient, who gave verbal consent to proceed.  History of Present Illness Carmen Hoffman is a 53 year old female who presents with ear discomfort and fatigue.  Otalgia and aural fullness - Discomfort and sensation of fluid in the right ear - Greater pressure and discomfort in the right ear compared to the left - No redness or visible inflammation  Fatigue - Persistent fatigue - Partially attributed to underlying thyroid  condition - Takes thyroid  medication and a multivitamin daily - Follow-up with thyroid  specialist scheduled for January  Vasomotor symptoms of menopause - Severe hot flashes occurring frequently, especially during night shifts - Profuse sweating associated with hot flashes - Sleep disruption due to symptoms - Stands in a cooler at times to alleviate symptoms  Right foot pain and injury - Sustained injury to right foot in late August when a ramp fell on it - Throbbing pain and clicking sensation in the right foot since injury - Presence of a scar on the right foot - Pain worsens with prolonged standing - No significant bruising - Elevation of foot provides some relief     ROS Per HPI      Objective:    BP 122/74 (BP Location: Left Arm, Patient Position: Sitting)   Pulse 82   Temp 98.1 F (36.7 C) (Temporal)   Ht 5' 3.5 (1.613 m)   Wt 143 lb 6.4 oz (65 kg)   SpO2 100%   BMI 25.00 kg/m    Physical Exam Vitals and nursing note reviewed.  Constitutional:      General: She is not in acute distress.    Appearance: Normal appearance. She is normal weight.  HENT:     Head: Normocephalic and atraumatic.     Right Ear: External ear normal.     Left Ear: External ear normal.      Nose: Nose normal.     Mouth/Throat:     Mouth: Mucous membranes are moist.     Pharynx: Oropharynx is clear.  Eyes:     Extraocular Movements: Extraocular movements intact.     Pupils: Pupils are equal, round, and reactive to light.  Cardiovascular:     Rate and Rhythm: Normal rate and regular rhythm.     Pulses: Normal pulses.     Heart sounds: Normal heart sounds.  Pulmonary:     Effort: Pulmonary effort is normal. No respiratory distress.     Breath sounds: Normal breath sounds. No wheezing, rhonchi or rales.  Musculoskeletal:        General: Normal range of motion.     Cervical back: Normal range of motion.     Right lower leg: No edema.     Left lower leg: No edema.  Lymphadenopathy:     Cervical: No cervical adenopathy.  Neurological:     General: No focal deficit present.     Mental Status: She is alert and oriented to person, place, and time.  Psychiatric:        Mood and Affect: Mood normal.        Thought Content: Thought content normal.     Results for orders placed or performed in visit on 08/22/24  CBC  with Differential/Platelet  Result Value Ref Range   WBC 8.6 4.0 - 10.5 K/uL   RBC 4.16 3.87 - 5.11 Mil/uL   Hemoglobin 12.6 12.0 - 15.0 g/dL   HCT 63.3 63.9 - 53.9 %   MCV 88.0 78.0 - 100.0 fl   MCHC 34.5 30.0 - 36.0 g/dL   RDW 86.8 88.4 - 84.4 %   Platelets 197.0 150.0 - 400.0 K/uL   Neutrophils Relative % 58.8 43.0 - 77.0 %   Lymphocytes Relative 32.5 12.0 - 46.0 %   Monocytes Relative 6.7 3.0 - 12.0 %   Eosinophils Relative 1.6 0.0 - 5.0 %   Basophils Relative 0.4 0.0 - 3.0 %   Neutro Abs 5.1 1.4 - 7.7 K/uL   Lymphs Abs 2.8 0.7 - 4.0 K/uL   Monocytes Absolute 0.6 0.1 - 1.0 K/uL   Eosinophils Absolute 0.1 0.0 - 0.7 K/uL   Basophils Absolute 0.0 0.0 - 0.1 K/uL  Comprehensive metabolic panel with GFR  Result Value Ref Range   Sodium 139 135 - 145 mEq/L   Potassium 3.6 3.5 - 5.1 mEq/L   Chloride 101 96 - 112 mEq/L   CO2 31 19 - 32 mEq/L   Glucose,  Bld 107 (H) 70 - 99 mg/dL   BUN 15 6 - 23 mg/dL   Creatinine, Ser 9.31 0.40 - 1.20 mg/dL   Total Bilirubin 0.3 0.2 - 1.2 mg/dL   Alkaline Phosphatase 60 39 - 117 U/L   AST 17 0 - 37 U/L   ALT 13 0 - 35 U/L   Total Protein 7.1 6.0 - 8.3 g/dL   Albumin 4.3 3.5 - 5.2 g/dL   GFR 00.00 >39.99 mL/min   Calcium 9.1 8.4 - 10.5 mg/dL  TSH  Result Value Ref Range   TSH 2.93 0.35 - 5.50 uIU/mL        Assessment & Plan:   Assessment and Plan Assessment & Plan Left ear pain with middle ear effusion and associated dizziness Fluid in right ear more than left, causing pain and dizziness. No infection. Possible eardrum stiffness increasing pressure and pain. - Prescribed Zyrtec for one week to dry out ear fluid. - Ordered CBC and metabolic panel to rule out other issues.  Right foot pain  Right foot pain with throbbing and clicking, possibly ligament tear or tendinitis. No bruising or swelling. Pain worsens with standing and movement. - Ordered x-ray of right foot to rule out stress fracture or abnormalities. - Consider physical therapy or steroid injection if x-ray normal and symptoms persist.  Menopausal symptoms with hot flashes and sleep disturbance Severe hot flashes and sleep disturbances due to menopause. Discussed treatments including black cohosh and clonidine . Clonidine  may cause drowsiness but reduces hot flashes. - Prescribed clonidine  for hot flashes, twice daily as needed. - Discussed black cohosh as supplement option.  Hypothyroidism On thyroid  medication. Reports fatigue, possibly related to thyroid  function. No recent thyroid  tests. - Ordered thyroid  function tests to assess current status. - Will review thyroid  medication dosage if necessary.     Orders Placed This Encounter  Procedures   DG Foot Complete Right    Standing Status:   Future    Number of Occurrences:   1    Expiration Date:   08/22/2025    Reason for Exam (SYMPTOM  OR DIAGNOSIS REQUIRED):   right  foot pain    Is patient pregnant?:   No    Preferred imaging location?:   [facility]   CBC  with Differential/Platelet    Release to patient:   Immediate [1]   Comprehensive metabolic panel with GFR    Release to patient:   Immediate [1]   TSH     Meds ordered this encounter  Medications   cloNIDine  (CATAPRES ) 0.1 MG tablet    Sig: Take 1 tablet (0.1 mg total) by mouth 2 (two) times daily as needed.    Dispense:  30 tablet    Refill:  3    Return in about 6 months (around 02/19/2025) for physical.  Corean LITTIE Ku, FNP

## 2024-08-23 NOTE — Telephone Encounter (Signed)
 Spoke with patient regarding xray comments.

## 2024-09-01 ENCOUNTER — Other Ambulatory Visit (HOSPITAL_COMMUNITY): Payer: Self-pay

## 2024-09-03 NOTE — Patient Instructions (Signed)
 Continue current medication regimen  We are checking labs today, will be in contact with any results that require further attention  We are getting an xray today. We will be in contact with any abnormal results that require further attention.  Follow-up with me in 6 mos for medication management, sooner if needed.

## 2024-09-22 ENCOUNTER — Other Ambulatory Visit (HOSPITAL_COMMUNITY): Payer: Self-pay

## 2024-09-22 MED ORDER — THYROID 30 MG PO TABS
30.0000 mg | ORAL_TABLET | Freq: Every day | ORAL | 5 refills | Status: AC
  Filled 2024-09-22 – 2024-10-31 (×2): qty 90, 90d supply, fill #0

## 2024-09-22 MED ORDER — THYROID 15 MG PO TABS
15.0000 mg | ORAL_TABLET | Freq: Every day | ORAL | 5 refills | Status: AC
  Filled 2024-09-22 – 2024-10-31 (×2): qty 90, 90d supply, fill #0

## 2024-10-31 ENCOUNTER — Other Ambulatory Visit (HOSPITAL_COMMUNITY): Payer: Self-pay

## 2024-10-31 ENCOUNTER — Other Ambulatory Visit: Payer: Self-pay
